# Patient Record
Sex: Male | Born: 1945
Health system: Southern US, Community
[De-identification: ages and names within clinical notes are randomized; demographics above are authoritative.]

## PROBLEM LIST (undated history)

## (undated) DIAGNOSIS — F419 Anxiety disorder, unspecified: Secondary | ICD-10-CM

## (undated) DIAGNOSIS — F3181 Bipolar II disorder: Secondary | ICD-10-CM

## (undated) DIAGNOSIS — F32A Depression, unspecified: Secondary | ICD-10-CM

## (undated) DIAGNOSIS — C61 Malignant neoplasm of prostate: Secondary | ICD-10-CM

## (undated) HISTORY — PX: REPLACEMENT TOTAL KNEE: SUR1224

## (undated) HISTORY — PX: PROSTATE BIOPSY: SHX241

---

## 2015-03-22 DIAGNOSIS — R972 Elevated prostate specific antigen [PSA]: Secondary | ICD-10-CM | POA: Diagnosis not present

## 2015-03-22 DIAGNOSIS — N401 Enlarged prostate with lower urinary tract symptoms: Secondary | ICD-10-CM | POA: Diagnosis not present

## 2015-05-10 DIAGNOSIS — M150 Primary generalized (osteo)arthritis: Secondary | ICD-10-CM | POA: Diagnosis not present

## 2015-05-10 DIAGNOSIS — M797 Fibromyalgia: Secondary | ICD-10-CM | POA: Diagnosis not present

## 2015-05-10 DIAGNOSIS — G4700 Insomnia, unspecified: Secondary | ICD-10-CM | POA: Diagnosis not present

## 2015-05-18 DIAGNOSIS — R972 Elevated prostate specific antigen [PSA]: Secondary | ICD-10-CM | POA: Diagnosis not present

## 2015-05-18 DIAGNOSIS — N401 Enlarged prostate with lower urinary tract symptoms: Secondary | ICD-10-CM | POA: Diagnosis not present

## 2015-05-24 DIAGNOSIS — R972 Elevated prostate specific antigen [PSA]: Secondary | ICD-10-CM | POA: Diagnosis not present

## 2015-05-24 DIAGNOSIS — N138 Other obstructive and reflux uropathy: Secondary | ICD-10-CM | POA: Diagnosis not present

## 2015-07-12 DIAGNOSIS — N210 Calculus in bladder: Secondary | ICD-10-CM | POA: Diagnosis not present

## 2015-07-12 DIAGNOSIS — N401 Enlarged prostate with lower urinary tract symptoms: Secondary | ICD-10-CM | POA: Diagnosis not present

## 2015-07-12 DIAGNOSIS — N323 Diverticulum of bladder: Secondary | ICD-10-CM | POA: Diagnosis not present

## 2015-07-12 DIAGNOSIS — Z0000 Encounter for general adult medical examination without abnormal findings: Secondary | ICD-10-CM | POA: Diagnosis not present

## 2015-07-12 DIAGNOSIS — R972 Elevated prostate specific antigen [PSA]: Secondary | ICD-10-CM | POA: Diagnosis not present

## 2015-07-12 DIAGNOSIS — N4883 Acquired buried penis: Secondary | ICD-10-CM | POA: Diagnosis not present

## 2015-08-31 DIAGNOSIS — R972 Elevated prostate specific antigen [PSA]: Secondary | ICD-10-CM | POA: Diagnosis not present

## 2015-09-01 DIAGNOSIS — C61 Malignant neoplasm of prostate: Secondary | ICD-10-CM | POA: Diagnosis not present

## 2015-09-11 DIAGNOSIS — I10 Essential (primary) hypertension: Secondary | ICD-10-CM | POA: Diagnosis not present

## 2015-09-11 DIAGNOSIS — Z87448 Personal history of other diseases of urinary system: Secondary | ICD-10-CM | POA: Diagnosis not present

## 2015-09-11 DIAGNOSIS — C61 Malignant neoplasm of prostate: Secondary | ICD-10-CM | POA: Diagnosis not present

## 2015-09-11 DIAGNOSIS — F411 Generalized anxiety disorder: Secondary | ICD-10-CM | POA: Diagnosis not present

## 2015-09-11 DIAGNOSIS — Z79899 Other long term (current) drug therapy: Secondary | ICD-10-CM | POA: Diagnosis not present

## 2015-09-11 DIAGNOSIS — R413 Other amnesia: Secondary | ICD-10-CM | POA: Diagnosis not present

## 2015-09-11 DIAGNOSIS — F329 Major depressive disorder, single episode, unspecified: Secondary | ICD-10-CM | POA: Diagnosis not present

## 2015-10-02 DIAGNOSIS — C61 Malignant neoplasm of prostate: Secondary | ICD-10-CM | POA: Diagnosis not present

## 2015-10-12 DIAGNOSIS — R5383 Other fatigue: Secondary | ICD-10-CM | POA: Diagnosis not present

## 2015-10-12 DIAGNOSIS — M179 Osteoarthritis of knee, unspecified: Secondary | ICD-10-CM | POA: Diagnosis not present

## 2015-10-12 DIAGNOSIS — R7303 Prediabetes: Secondary | ICD-10-CM | POA: Diagnosis not present

## 2015-10-12 DIAGNOSIS — E538 Deficiency of other specified B group vitamins: Secondary | ICD-10-CM | POA: Diagnosis not present

## 2015-10-12 DIAGNOSIS — R251 Tremor, unspecified: Secondary | ICD-10-CM | POA: Diagnosis not present

## 2015-10-30 DIAGNOSIS — F410 Panic disorder [episodic paroxysmal anxiety] without agoraphobia: Secondary | ICD-10-CM | POA: Diagnosis not present

## 2015-10-30 DIAGNOSIS — F331 Major depressive disorder, recurrent, moderate: Secondary | ICD-10-CM | POA: Diagnosis not present

## 2015-11-29 DIAGNOSIS — F410 Panic disorder [episodic paroxysmal anxiety] without agoraphobia: Secondary | ICD-10-CM | POA: Diagnosis not present

## 2015-11-29 DIAGNOSIS — F331 Major depressive disorder, recurrent, moderate: Secondary | ICD-10-CM | POA: Diagnosis not present

## 2015-11-30 DIAGNOSIS — Z23 Encounter for immunization: Secondary | ICD-10-CM | POA: Diagnosis not present

## 2015-11-30 DIAGNOSIS — D1800 Hemangioma unspecified site: Secondary | ICD-10-CM | POA: Diagnosis not present

## 2015-11-30 DIAGNOSIS — L821 Other seborrheic keratosis: Secondary | ICD-10-CM | POA: Diagnosis not present

## 2015-11-30 DIAGNOSIS — L814 Other melanin hyperpigmentation: Secondary | ICD-10-CM | POA: Diagnosis not present

## 2015-11-30 DIAGNOSIS — L918 Other hypertrophic disorders of the skin: Secondary | ICD-10-CM | POA: Diagnosis not present

## 2016-01-16 DIAGNOSIS — F418 Other specified anxiety disorders: Secondary | ICD-10-CM | POA: Diagnosis not present

## 2016-02-16 DIAGNOSIS — I10 Essential (primary) hypertension: Secondary | ICD-10-CM | POA: Diagnosis not present

## 2016-02-16 DIAGNOSIS — F329 Major depressive disorder, single episode, unspecified: Secondary | ICD-10-CM | POA: Diagnosis not present

## 2016-07-24 DIAGNOSIS — R351 Nocturia: Secondary | ICD-10-CM | POA: Diagnosis not present

## 2016-07-24 DIAGNOSIS — N401 Enlarged prostate with lower urinary tract symptoms: Secondary | ICD-10-CM | POA: Diagnosis not present

## 2016-07-24 DIAGNOSIS — C61 Malignant neoplasm of prostate: Secondary | ICD-10-CM | POA: Diagnosis not present

## 2016-08-16 DIAGNOSIS — F329 Major depressive disorder, single episode, unspecified: Secondary | ICD-10-CM | POA: Diagnosis not present

## 2016-08-16 DIAGNOSIS — Z79899 Other long term (current) drug therapy: Secondary | ICD-10-CM | POA: Diagnosis not present

## 2016-08-16 DIAGNOSIS — F411 Generalized anxiety disorder: Secondary | ICD-10-CM | POA: Diagnosis not present

## 2016-08-16 DIAGNOSIS — I10 Essential (primary) hypertension: Secondary | ICD-10-CM | POA: Diagnosis not present

## 2016-09-09 ENCOUNTER — Emergency Department (HOSPITAL_COMMUNITY): Payer: Medicare Other

## 2016-09-09 ENCOUNTER — Encounter (HOSPITAL_COMMUNITY): Payer: Self-pay | Admitting: *Deleted

## 2016-09-09 ENCOUNTER — Emergency Department (HOSPITAL_COMMUNITY)
Admission: EM | Admit: 2016-09-09 | Discharge: 2016-09-09 | Disposition: A | Discharge status: Home / Self Care | Payer: Medicare Other | Attending: Emergency Medicine | Admitting: Emergency Medicine

## 2016-09-09 DIAGNOSIS — R251 Tremor, unspecified: Secondary | ICD-10-CM

## 2016-09-09 DIAGNOSIS — Z96653 Presence of artificial knee joint, bilateral: Secondary | ICD-10-CM | POA: Insufficient documentation

## 2016-09-09 DIAGNOSIS — N183 Chronic kidney disease, stage 3 unspecified: Secondary | ICD-10-CM

## 2016-09-09 DIAGNOSIS — Z79899 Other long term (current) drug therapy: Secondary | ICD-10-CM | POA: Insufficient documentation

## 2016-09-09 DIAGNOSIS — R404 Transient alteration of awareness: Secondary | ICD-10-CM | POA: Diagnosis not present

## 2016-09-09 DIAGNOSIS — G4700 Insomnia, unspecified: Secondary | ICD-10-CM | POA: Diagnosis not present

## 2016-09-09 DIAGNOSIS — R55 Syncope and collapse: Secondary | ICD-10-CM

## 2016-09-09 DIAGNOSIS — Z8546 Personal history of malignant neoplasm of prostate: Secondary | ICD-10-CM | POA: Diagnosis not present

## 2016-09-09 DIAGNOSIS — S0990XA Unspecified injury of head, initial encounter: Secondary | ICD-10-CM | POA: Diagnosis not present

## 2016-09-09 HISTORY — DX: Bipolar II disorder: F31.81

## 2016-09-09 HISTORY — DX: Anxiety disorder, unspecified: F41.9

## 2016-09-09 LAB — CBC WITH DIFFERENTIAL/PLATELET
Basophils Absolute: 0 10*3/uL (ref 0.0–0.1)
Basophils Relative: 0 %
Eosinophils Absolute: 0.1 10*3/uL (ref 0.0–0.7)
Eosinophils Relative: 1 %
HCT: 39.7 % (ref 39.0–52.0)
Hemoglobin: 13.9 g/dL (ref 13.0–17.0)
Lymphocytes Relative: 14 %
Lymphs Abs: 1.9 10*3/uL (ref 0.7–4.0)
MCH: 29.2 pg (ref 26.0–34.0)
MCHC: 35 g/dL (ref 30.0–36.0)
MCV: 83.4 fL (ref 78.0–100.0)
Monocytes Absolute: 0.8 10*3/uL (ref 0.1–1.0)
Monocytes Relative: 5 %
Neutro Abs: 11.2 10*3/uL — ABNORMAL HIGH (ref 1.7–7.7)
Neutrophils Relative %: 80 %
Platelets: 255 10*3/uL (ref 150–400)
RBC: 4.76 MIL/uL (ref 4.22–5.81)
RDW: 13.5 % (ref 11.5–15.5)
WBC: 14 10*3/uL — ABNORMAL HIGH (ref 4.0–10.5)

## 2016-09-09 LAB — COMPREHENSIVE METABOLIC PANEL
ALBUMIN: 4.2 g/dL (ref 3.5–5.0)
ALK PHOS: 58 U/L (ref 38–126)
ALT: 31 U/L (ref 17–63)
AST: 44 U/L — AB (ref 15–41)
Anion gap: 9 (ref 5–15)
BUN: 25 mg/dL — ABNORMAL HIGH (ref 6–20)
CALCIUM: 9.2 mg/dL (ref 8.9–10.3)
CHLORIDE: 107 mmol/L (ref 101–111)
CO2: 22 mmol/L (ref 22–32)
CREATININE: 1.39 mg/dL — AB (ref 0.61–1.24)
GFR calc Af Amer: 57 mL/min — ABNORMAL LOW (ref >60)
GFR calc non Af Amer: 49 mL/min — ABNORMAL LOW (ref >60)
GLUCOSE: 122 mg/dL — AB (ref 65–99)
Potassium: 3.8 mmol/L (ref 3.5–5.1)
SODIUM: 138 mmol/L (ref 135–145)
Total Bilirubin: 0.6 mg/dL (ref 0.3–1.2)
Total Protein: 7.1 g/dL (ref 6.5–8.1)

## 2016-09-09 LAB — URINALYSIS, ROUTINE W REFLEX MICROSCOPIC
Bilirubin Urine: NEGATIVE
Glucose, UA: NEGATIVE mg/dL
Hgb urine dipstick: NEGATIVE
Ketones, ur: NEGATIVE mg/dL
Leukocytes, UA: NEGATIVE
Nitrite: NEGATIVE
Protein, ur: NEGATIVE mg/dL
Specific Gravity, Urine: 1.017 (ref 1.005–1.030)
pH: 5 (ref 5.0–8.0)

## 2016-09-09 LAB — TROPONIN I: Troponin I: <0.03 ng/mL

## 2016-09-09 LAB — CBG MONITORING, ED: Glucose-Capillary: 132 mg/dL — ABNORMAL HIGH (ref 65–99)

## 2016-09-09 MED ORDER — SODIUM CHLORIDE 0.9 % IV BOLUS (SEPSIS)
1000.0000 mL | Freq: Once | INTRAVENOUS | Status: AC
  Administered 2016-09-09: 1000 mL via INTRAVENOUS

## 2016-09-09 MED ORDER — SODIUM CHLORIDE 0.9 % IV SOLN: INTRAVENOUS | Status: DC

## 2016-09-09 MED ORDER — ZOLPIDEM TARTRATE 5 MG PO TABS: 5.0000 mg | ORAL_TABLET | Freq: Every evening | ORAL | 0 refills | Status: DC | PRN

## 2016-09-09 NOTE — ED Triage Notes (Signed)
Patient is alert and oriented x4.  He is being seen for a near syncopal episode.  Patient states that he bent over to pick something up and became dizzy and fell.  Patient denies any LOC or pain.  Patient states that he is unable to stand.

## 2016-09-09 NOTE — ED Provider Notes (Signed)
[facility] DEPT Provider Note   CSN: [id number] Arrival date & time: 09/09/16  1549     History   Chief Complaint Chief Complaint  Patient presents with  . Near Syncope    HPI Calvin Santiago is a 71 y.o. male.  Pt presents to the ED today with near syncope.  Pt said that he bent over to pick something up, became dizzy, and fell.  The pt said that he hit head into a pet bed, so it was soft.  The pt was unable to stand up after the fall.  Pt said his pcp at [facility] left the practice and he does not have his new pt appt with his new doctor until August.  He also said that he has had a tremor to his right hand for a year.  He has had trouble walking.  He was going to get evaluated for Parkinson's disease, but then his doctor left, so he did not get the referral yet.   He has not been able to sleep for the last 3 days.  He has a hx of bipolar disease, but is not currently on any meds.  Pt also has prostate cancer and is not currently getting any treatment.  He had surgery on it once, and said his urologist is watching it.      Past Medical History:  Diagnosis Date  . Anxiety   . Bipolar 2 disorder ([place])   prostate cancer  There are no active problems to display for this patient.   Past Surgical History:  Procedure Laterality Date  . REPLACEMENT TOTAL KNEE     bilateral   Prostate surgery    Home Medications    Prior to Admission medications   Medication Sig Start Date End Date Taking? Authorizing Provider  acetaminophen (TYLENOL) 500 MG tablet Take 1,000 mg by mouth every 6 (six) hours as needed for moderate pain.   Yes [provider]  ALPRAZolam Duanne Moron) 1 MG tablet Take 1 mg by mouth 3 (three) times daily.   Yes [provider]  amLODipine (NORVASC) 10 MG tablet Take 5 mg by mouth daily.   Yes [provider]  ibuprofen (ADVIL,MOTRIN) 200 MG tablet Take 800 mg by mouth every 6 (six) hours as needed for moderate pain.   Yes [provider]  sertraline (ZOLOFT) 100 MG tablet Take 100 mg by mouth daily.   Yes [provider]  zolpidem (AMBIEN) 5 MG tablet Take 1 tablet (5 mg total) by mouth at bedtime as needed for sleep. 09/09/16   Isla Pence, MD    Family History No family history on file.  Social History Social History  Substance Use Topics  . Smoking status: Not on file  . Smokeless tobacco: Not on file  . Alcohol use Not on file     Allergies   Sulfa antibiotics   Review of Systems Review of Systems  Neurological: Positive for tremors and syncope.  Psychiatric/Behavioral: Positive for sleep disturbance.  All other systems reviewed and are negative.    Physical Exam Updated Vital Signs BP 107/81 (BP Location: Left Arm)   Pulse 96   Temp 98 F (36.7 C) (Oral)   Resp 16   Ht 6\' 2"  (1.88 m)   Wt 124.7 kg (275 lb)   SpO2 96%   BMI 35.31 kg/m   Physical Exam  Constitutional: He is oriented to person, place, and time. He appears well-developed and well-nourished.  HENT:  Head: Normocephalic and atraumatic.  Right Ear: External ear normal.  Left Ear: External ear normal.  Nose: Nose normal.  Mouth/Throat: Oropharynx is clear and moist.  Eyes: Conjunctivae and EOM are normal. Pupils are equal, round, and reactive to light.  Neck: Normal range of motion. Neck supple.  Cardiovascular: Normal rate, regular rhythm, normal heart sounds and intact distal pulses.   Pulmonary/Chest: Effort normal and breath sounds normal.  Abdominal: Soft. Bowel sounds are normal.  Musculoskeletal: Normal range of motion.  Neurological: He is alert and oriented to person, place, and time.  Skin: Skin is warm.  Psychiatric: He has a normal mood and affect. His behavior is normal. Judgment and thought content normal.  Nursing note and vitals reviewed.    ED Treatments / Results  Labs (all labs ordered are listed, but only abnormal results are displayed) Labs Reviewed  CBC WITH DIFFERENTIAL/PLATELET -  Abnormal; Notable for the following:       Result Value   WBC 14.0 (*)    Neutro Abs 11.2 (*)    All other components within normal limits  COMPREHENSIVE METABOLIC PANEL - Abnormal; Notable for the following:    Glucose, Bld 122 (*)    BUN 25 (*)    Creatinine, Ser 1.39 (*)    AST 44 (*)    GFR calc non Af Amer 49 (*)    GFR calc Af Amer 57 (*)    All other components within normal limits  CBG MONITORING, ED - Abnormal; Notable for the following:    Glucose-Capillary 132 (*)    All other components within normal limits  TROPONIN I  URINALYSIS, ROUTINE W REFLEX MICROSCOPIC    EKG  EKG Interpretation  Date/Time:  Monday September 09 2016 16:14:47 EDT Ventricular Rate:  94 PR Interval:    QRS Duration: 89 QT Interval:  343 QTC Calculation: 429 R Axis:   -30 Text Interpretation:  Sinus rhythm Left axis deviation Low voltage, precordial leads Confirmed by Isla Pence [id number]) on 09/09/2016 5:50:02 PM       Radiology Dg Chest 2 View  Result Date: 09/09/2016 CLINICAL DATA:  Syncopal episode today. EXAM: CHEST  2 VIEW COMPARISON:  None. FINDINGS: The lungs are clear without focal pneumonia, edema, pneumothorax or pleural effusion. The cardiopericardial silhouette is within normal limits for size. The visualized bony structures of the thorax are intact. Telemetry leads overlie the chest. IMPRESSION: No active cardiopulmonary disease. Electronically Signed   By: Misty Stanley M.D.   On: 09/09/2016 17:57   Ct Head Wo Contrast  Result Date: 09/09/2016 CLINICAL DATA:  Patient got dizzy and fell face down on floor. EXAM: CT HEAD WITHOUT CONTRAST TECHNIQUE: Contiguous axial images were obtained from the base of the skull through the vertex without intravenous contrast. COMPARISON:  None. FINDINGS: Brain: There is no evidence for acute hemorrhage, hydrocephalus, mass lesion, or abnormal extra-axial fluid collection. No definite CT evidence for acute infarction. There is no evidence for acute  hemorrhage, hydrocephalus, mass lesion, or abnormal extra-axial fluid collection. No definite CT evidence for acute infarction. Diffuse loss of parenchymal volume is consistent with atrophy. Patchy low attenuation in the deep hemispheric and periventricular white matter is nonspecific, but likely reflects chronic microvascular ischemic demyelination. Vascular: No hyperdense vessel or unexpected calcification. Skull: No evidence for fracture. No worrisome lytic or sclerotic lesion. Sinuses/Orbits: The visualized paranasal sinuses and mastoid air cells are clear. Visualized portions of the globes and intraorbital fat are unremarkable. Other: None. IMPRESSION: 1. No acute intracranial abnormality. 2. Atrophy with  chronic small vessel white matter ischemic disease. Electronically Signed   By: Misty Stanley M.D.   On: 09/09/2016 18:05    Procedures Procedures (including critical care time)  Medications Ordered in ED Medications  sodium chloride 0.9 % bolus 1,000 mL (1,000 mLs Intravenous New Bag/Given 09/09/16 1708)    And  0.9 %  sodium chloride infusion (not administered)     Initial Impression / Assessment and Plan / ED Course  I have reviewed the triage vital signs and the nursing notes.  Pertinent labs & imaging results that were available during my care of the patient were reviewed by me and considered in my medical decision making (see chart for details).     Pt is feeling better.  He said he's been told he has CRI.  He is instructed to f/u with his pcp and return if worse.  Final Clinical Impressions(s) / ED Diagnoses   Final diagnoses:  Near syncope  CRI (chronic renal insufficiency), stage 3 (moderate)  Tremor  Insomnia, unspecified type    New Prescriptions New Prescriptions   ZOLPIDEM (AMBIEN) 5 MG TABLET    Take 1 tablet (5 mg total) by mouth at bedtime as needed for sleep.     Isla Pence, MD 09/09/16 [phone]

## 2016-09-09 NOTE — ED Notes (Signed)
Patient is alert and oriented x3.  He was given DC instructions and follow up visit instructions.  Patient gave verbal understanding.  He was DC ambulatory under his own power to home.  V/S stable.  He was not showing any signs of distress on DC 

## 2016-09-13 ENCOUNTER — Emergency Department (HOSPITAL_COMMUNITY): Payer: Medicare Other

## 2016-09-13 ENCOUNTER — Encounter (HOSPITAL_COMMUNITY): Payer: Self-pay | Admitting: Emergency Medicine

## 2016-09-13 ENCOUNTER — Emergency Department (HOSPITAL_COMMUNITY)
Admission: EM | Admit: 2016-09-13 | Discharge: 2016-09-16 | Disposition: A | Discharge status: Other Acute Inpatient Hospital | Payer: Medicare Other | Attending: Emergency Medicine | Admitting: Emergency Medicine

## 2016-09-13 DIAGNOSIS — R45851 Suicidal ideations: Secondary | ICD-10-CM | POA: Insufficient documentation

## 2016-09-13 DIAGNOSIS — Z79899 Other long term (current) drug therapy: Secondary | ICD-10-CM | POA: Insufficient documentation

## 2016-09-13 DIAGNOSIS — F410 Panic disorder [episodic paroxysmal anxiety] without agoraphobia: Secondary | ICD-10-CM | POA: Diagnosis not present

## 2016-09-13 DIAGNOSIS — F332 Major depressive disorder, recurrent severe without psychotic features: Secondary | ICD-10-CM | POA: Diagnosis not present

## 2016-09-13 DIAGNOSIS — F309 Manic episode, unspecified: Secondary | ICD-10-CM | POA: Diagnosis not present

## 2016-09-13 DIAGNOSIS — G4700 Insomnia, unspecified: Secondary | ICD-10-CM | POA: Diagnosis not present

## 2016-09-13 DIAGNOSIS — F919 Conduct disorder, unspecified: Secondary | ICD-10-CM | POA: Diagnosis present

## 2016-09-13 DIAGNOSIS — R443 Hallucinations, unspecified: Secondary | ICD-10-CM | POA: Insufficient documentation

## 2016-09-13 DIAGNOSIS — R4182 Altered mental status, unspecified: Secondary | ICD-10-CM | POA: Diagnosis not present

## 2016-09-13 DIAGNOSIS — F312 Bipolar disorder, current episode manic severe with psychotic features: Secondary | ICD-10-CM | POA: Diagnosis not present

## 2016-09-13 LAB — COMPREHENSIVE METABOLIC PANEL
ALK PHOS: 56 U/L (ref 38–126)
ALT: 43 U/L (ref 17–63)
ANION GAP: 11 (ref 5–15)
AST: 49 U/L — ABNORMAL HIGH (ref 15–41)
Albumin: 4.5 g/dL (ref 3.5–5.0)
BUN: 17 mg/dL (ref 6–20)
CALCIUM: 9.5 mg/dL (ref 8.9–10.3)
CO2: 23 mmol/L (ref 22–32)
Chloride: 105 mmol/L (ref 101–111)
Creatinine, Ser: 1.02 mg/dL (ref 0.61–1.24)
GFR calc non Af Amer: >60 mL/min (ref >60)
Glucose, Bld: 113 mg/dL — ABNORMAL HIGH (ref 65–99)
Potassium: 5.1 mmol/L (ref 3.5–5.1)
SODIUM: 139 mmol/L (ref 135–145)
TOTAL PROTEIN: 7.6 g/dL (ref 6.5–8.1)
Total Bilirubin: 1.7 mg/dL — ABNORMAL HIGH (ref 0.3–1.2)

## 2016-09-13 LAB — URINALYSIS, ROUTINE W REFLEX MICROSCOPIC
Bacteria, UA: NONE SEEN
Bilirubin Urine: NEGATIVE
GLUCOSE, UA: NEGATIVE mg/dL
Ketones, ur: NEGATIVE mg/dL
Leukocytes, UA: NEGATIVE
NITRITE: NEGATIVE
PH: 5 (ref 5.0–8.0)
Protein, ur: NEGATIVE mg/dL
SPECIFIC GRAVITY, URINE: 1.018 (ref 1.005–1.030)

## 2016-09-13 LAB — RAPID URINE DRUG SCREEN, HOSP PERFORMED
Amphetamines: NOT DETECTED
Barbiturates: NOT DETECTED
Benzodiazepines: POSITIVE — AB
Cocaine: NOT DETECTED
OPIATES: NOT DETECTED
TETRAHYDROCANNABINOL: NOT DETECTED

## 2016-09-13 LAB — CBC
HCT: 42.7 % (ref 39.0–52.0)
HEMOGLOBIN: 14.8 g/dL (ref 13.0–17.0)
MCH: 28.8 pg (ref 26.0–34.0)
MCHC: 34.7 g/dL (ref 30.0–36.0)
MCV: 83.2 fL (ref 78.0–100.0)
Platelets: 264 10*3/uL (ref 150–400)
RBC: 5.13 MIL/uL (ref 4.22–5.81)
RDW: 13.6 % (ref 11.5–15.5)
WBC: 9.1 10*3/uL (ref 4.0–10.5)

## 2016-09-13 LAB — SALICYLATE LEVEL

## 2016-09-13 LAB — ACETAMINOPHEN LEVEL: Acetaminophen (Tylenol), Serum: <10 ug/mL — ABNORMAL LOW (ref 10–30)

## 2016-09-13 LAB — ETHANOL

## 2016-09-13 LAB — I-STAT CG4 LACTIC ACID, ED
LACTIC ACID, VENOUS: 1.13 mmol/L (ref 0.5–1.9)
Lactic Acid, Venous: 0.91 mmol/L (ref 0.5–1.9)

## 2016-09-13 MED ORDER — LORAZEPAM 1 MG PO TABS
2.0000 mg | ORAL_TABLET | Freq: Once | ORAL | Status: AC
  Administered 2016-09-13: 2 mg via ORAL
  Filled 2016-09-13: qty 2

## 2016-09-13 MED ORDER — ALPRAZOLAM 1 MG PO TABS
1.0000 mg | ORAL_TABLET | ORAL | Status: DC
  Filled 2016-09-13: qty 1

## 2016-09-13 MED ORDER — ALUM & MAG HYDROXIDE-SIMETH 200-200-20 MG/5ML PO SUSP: 30.0000 mL | Freq: Four times a day (QID) | ORAL | Status: DC | PRN

## 2016-09-13 MED ORDER — AMLODIPINE BESYLATE 5 MG PO TABS
5.0000 mg | ORAL_TABLET | Freq: Every day | ORAL | Status: DC
  Administered 2016-09-14 – 2016-09-16 (×3): 5 mg via ORAL
  Filled 2016-09-13 (×3): qty 1

## 2016-09-13 MED ORDER — LORAZEPAM 2 MG/ML IJ SOLN: 1.0000 mg | Freq: Once | INTRAMUSCULAR | Status: DC

## 2016-09-13 MED ORDER — ONDANSETRON HCL 4 MG PO TABS: 4.0000 mg | ORAL_TABLET | Freq: Three times a day (TID) | ORAL | Status: DC | PRN

## 2016-09-13 MED ORDER — ACETAMINOPHEN 500 MG PO TABS
1000.0000 mg | ORAL_TABLET | Freq: Four times a day (QID) | ORAL | Status: DC | PRN
  Administered 2016-09-14: 1000 mg via ORAL
  Filled 2016-09-13: qty 2

## 2016-09-13 MED ORDER — ZOLPIDEM TARTRATE 5 MG PO TABS: 5.0000 mg | ORAL_TABLET | Freq: Every evening | ORAL | Status: DC | PRN

## 2016-09-13 MED ORDER — IBUPROFEN 200 MG PO TABS: 600.0000 mg | ORAL_TABLET | Freq: Four times a day (QID) | ORAL | Status: DC | PRN

## 2016-09-13 NOTE — BH Assessment (Addendum)
Assessment Note  Calvin Santiago is an 70 y.o. male that presents this date with thoughts of self harm but is very tangential and renders limited history. Patient is difficult to redirect but reports he was "thinking bad thoughts about not wanting to be here" earlier this date. This Probation officer cannot redirect patient to elaborate on content with patient rendering history from "twenty years ago" when he was "going to be done with it all." Information for purposes of assessment were obtained from admission notes and history review. Per that note, "Patient has a history of OCD and bipolar disorder complaining of a panic attack this date, patient is observed to be shaking, unable to speak beyond "yes" and "no." As triage progressed, patient became moderately verbal. Patient reports SI without plan, but states that sometime recently he pointed a gun at himself and considered killing himself, decided not to because people depended on him, patient unsure of when this occurred but does have access to guns. Reports hallucinations of doctors that aren't there. No HI, no auditory hallucinations. Patient is oriented to place only. Patient is speaking very rapidly and disorganized.Patient's mother is dying of cancer, wife states patient hasn't been normal since he visited his dying mother 2 weeks ago. Patient has had multiple episodes of "panic attacks" since. Wife has several papers on which patient has been writing and scribbling excessively, many scribbles and disorganized words covering entire paper, some words written repeatedly". Per notes, patient is receiving services from [place] MD. Case was staffed with [facility] NP who recommended patient be re-evaluated in the a.m.    Diagnosis: Bipolar 2 (per note review)  Past Medical History:  Past Medical History:  Diagnosis Date  . Anxiety   . Bipolar 2 disorder [facility])     Past Surgical History:  Procedure Laterality Date  . REPLACEMENT TOTAL KNEE     bilateral     Family History: History reviewed. No pertinent family history.  Social History:  has no tobacco, alcohol, and drug history on file.  Additional Social History:  Alcohol / Drug Use Pain Medications: See MAR Prescriptions: See MAR Over the Counter: See MAR History of alcohol / drug use?: No history of alcohol / drug abuse Longest period of sobriety (when/how long):  (NA) Negative Consequences of Use:  (NA) Withdrawal Symptoms:  (NA)  CIWA: CIWA-Ar BP: (!) 162/112 (movement) Pulse Rate: (!) 105 COWS:    Allergies:  Allergies  Allergen Reactions  . Sulfa Antibiotics Other (See Comments)    As a child, he ran into walls    Home Medications:  (Not in a hospital admission)  OB/GYN Status:  No LMP for male patient.  General Assessment Data Location of Assessment: [facility] ED TTS Assessment: In system Is this a Tele or Face-to-Face Assessment?: Face-to-Face Is this an Initial Assessment or a Re-assessment for this encounter?: Initial Assessment Marital status: Married [place] name: NA Is patient pregnant?: No Pregnancy Status: No Living Arrangements: Spouse/significant other Can pt return to current living arrangement?: Yes Admission Status: Voluntary Is patient capable of signing voluntary admission?: Yes Referral Source: Self/Family/Friend Insurance type: Medicare  Medical Screening Exam ([place]) Medical Exam completed: Yes  Crisis Care Plan Living Arrangements: Spouse/significant other Legal Guardian:  (NA) Name of Psychiatrist: Akintayo MD Name of Therapist: None  Education Status Is patient currently in school?: No Current Grade:  (NA) Highest grade of school patient has completed:  (12th grade) Name of school:  (NA) Contact person:  (NA)  Risk to self with  the past 6 months Suicidal Ideation: Yes-Currently Present Has patient been a risk to self within the past 6 months prior to admission? : No Suicidal Intent: No Has patient had any suicidal  intent within the past 6 months prior to admission? : No Is patient at risk for suicide?: Yes Suicidal Plan?: No Has patient had any suicidal plan within the past 6 months prior to admission? : No Access to Means: No What has been your use of drugs/alcohol within the last 12 months?: Denies Previous Attempts/Gestures: Yes How many times?: 1 (20 years ago pt is vague) Other Self Harm Risks: Unknown Triggers for Past Attempts: Unknown Intentional Self Injurious Behavior: None Family Suicide History: No Recent stressful life event(s):  (UTA) Persecutory voices/beliefs?: No Depression: No Depression Symptoms:  (pt is vague) Substance abuse history and/or treatment for substance abuse?: No Suicide prevention information given to non-admitted patients: Not applicable  Risk to Others within the past 6 months Homicidal Ideation: No Does patient have any lifetime risk of violence toward others beyond the six months prior to admission? : No Thoughts of Harm to Others: No Current Homicidal Intent: No Current Homicidal Plan: No Access to Homicidal Means: No Identified Victim: NA History of harm to others?: No Assessment of Violence: None Noted Violent Behavior Description: NA Does patient have access to weapons?: No Criminal Charges Pending?: No Does patient have a court date: No Is patient on probation?: No  Psychosis Hallucinations: None noted Delusions: None noted  Mental Status Report Appearance/Hygiene: In scrubs Eye Contact: Fair Motor Activity: Freedom of movement Speech: Rapid, Pressured Level of Consciousness: Restless Mood: Anxious Affect: Anxious Anxiety Level: Moderate Thought Processes: Tangential Judgement: Unimpaired Orientation: Place Obsessive Compulsive Thoughts/Behaviors: None  Cognitive Functioning Concentration: Decreased Memory: Recent Impaired IQ: Average Insight: Poor Impulse Control: Fair Appetite:  (UTA) Weight Loss:  (UTA) Weight Gain:   (UTA) Sleep:  (UTA) Total Hours of Sleep:  (UTA) Vegetative Symptoms: None  ADLScreening [facility] Assessment Services) Patient's cognitive ability adequate to safely complete daily activities?: Yes Patient able to express need for assistance with ADLs?: Yes Independently performs ADLs?: Yes (appropriate for developmental age)  Prior Inpatient Therapy Prior Inpatient Therapy: Yes Prior Therapy Dates:  (Pt stated 20 years ago) Prior Therapy Facilty/Provider(s):  (UTA) Reason for Treatment: S/I  Prior Outpatient Therapy Prior Outpatient Therapy: Yes Prior Therapy Dates: Ongoing Prior Therapy Facilty/Provider(s): Akintayo MD Reason for Treatment: MH issues Does patient have an ACCT team?: No Does patient have Intensive In-House Services?  : No Does patient have [facility] services? : No Does patient have P4CC services?: No  ADL Screening (condition at time of admission) Patient's cognitive ability adequate to safely complete daily activities?: Yes Is the patient deaf or have difficulty hearing?: No Does the patient have difficulty seeing, even when wearing glasses/contacts?: No Does the patient have difficulty concentrating, remembering, or making decisions?: No Patient able to express need for assistance with ADLs?: Yes Does the patient have difficulty dressing or bathing?: No Independently performs ADLs?: Yes (appropriate for developmental age) Does the patient have difficulty walking or climbing stairs?: No Weakness of Legs: Both Weakness of Arms/Hands: None  Home Assistive Devices/Equipment Home Assistive Devices/Equipment: None  Therapy Consults (therapy consults require a physician order) PT Evaluation Needed: No OT Evalulation Needed: No SLP Evaluation Needed: No Abuse/Neglect Assessment (Assessment to be complete while patient is alone) Physical Abuse: Denies Verbal Abuse: Denies Sexual Abuse: Denies Exploitation of patient/patient's resources: Denies Self-Neglect:  Denies Values / Beliefs Cultural Requests During Hospitalization:  None Spiritual Requests During Hospitalization: None Consults Spiritual Care Consult Needed: No Social Work Consult Needed: No Regulatory affairs officer (For Healthcare) Does Patient Have a Medical Advance Directive?: No Would patient like information on creating a medical advance directive?: No - Patient declined    Additional Information 1:1 In Past 12 Months?: No CIRT Risk: No Elopement Risk: No Does patient have medical clearance?: No     Disposition: Case was staffed with Lu Duffel NP who recommended patient be re-evaluated in the a.m.   Disposition Initial Assessment Completed for this Encounter: Yes Disposition of Patient: Other dispositions Other disposition(s): Other (Comment) (Re-evaluate in the a.m.)  On Site Evaluation by:   Reviewed with Physician:    Mamie Nick 09/13/2016 4:03 PM

## 2016-09-13 NOTE — ED Notes (Signed)
Stuck patient for IV access, was able to obtain some blood for specimens but not enough needed for all labs ordered.  IV wasn't success.  Made Dr Ellender Hose aware of not able to obtain IV access.  Waiting for Orders for route change of medication

## 2016-09-13 NOTE — ED Provider Notes (Signed)
[facility] DEPT Provider Note   CSN: [id number] Arrival date & time: 09/13/16  0957     History   Chief Complaint Chief Complaint  Patient presents with  . Suicidal    HPI Calvin Santiago is a 71 y.o. male.  HPI  71 yo M with history of bipolar disorder, anxiety here with suicidal ideation and erratic behavior. History is limited 2/2 pressured speech with tangential thought process. However, per wife, pt recently learned of his mother's illness/placement in hospice. Since learning this, he has had increasingly erratic behavior, apparent hallucinations, and confusion, as well as pressured speech. He has a h/o hypomania/bipolar 2 disorder. On my assessment, pt is discussing his upbringing, his cousins/uncles, but when prompted to ask why he was seen here, goes off on other tangents regarding his upbringing.   Level 5 caveat invoked as remainder of history, ROS, and physical exam limited due to patient's mania.   Past Medical History:  Diagnosis Date  . Anxiety   . Bipolar 2 disorder ([place])     There are no active problems to display for this patient.   Past Surgical History:  Procedure Laterality Date  . REPLACEMENT TOTAL KNEE     bilateral       Home Medications    Prior to Admission medications   Medication Sig Start Date End Date Taking? Authorizing Provider  acetaminophen (TYLENOL) 500 MG tablet Take 1,000 mg by mouth every 6 (six) hours as needed for moderate pain.   Yes [provider]  ALPRAZolam Duanne Moron) 1 MG tablet Take 1 mg by mouth See admin instructions. 1 mg twice a day, 1 mg additional prn for anxiety   Yes [provider]  amLODipine (NORVASC) 10 MG tablet Take 5 mg by mouth daily.   Yes [provider]  ibuprofen (ADVIL,MOTRIN) 200 MG tablet Take 800 mg by mouth every 6 (six) hours as needed for moderate pain.   Yes [provider]  sertraline (ZOLOFT) 100 MG tablet Take 100 mg by mouth daily.   Yes [provider]  zolpidem (AMBIEN) 5 MG tablet Take 1 tablet (5 mg total) by mouth at bedtime as needed for sleep. 09/09/16  Yes Isla Pence, MD    Family History History reviewed. No pertinent family history.  Social History Social History  Substance Use Topics  . Smoking status: Not on file  . Smokeless tobacco: Not on file  . Alcohol use Not on file     Allergies   Sulfa antibiotics   Review of Systems Review of Systems  Unable to perform ROS: Mental status change  Constitutional: Positive for fatigue.  Psychiatric/Behavioral: Positive for agitation, behavioral problems, confusion, decreased concentration, hallucinations and suicidal ideas.     Physical Exam Updated Vital Signs BP (!) 154/90 (BP Location: Right Arm)   Pulse 95   Temp 98 F (36.7 C) (Oral)   Resp 18   SpO2 98%   Physical Exam  Constitutional: He is oriented to person, place, and time. He appears well-developed and well-nourished. No distress.  HENT:  Head: Normocephalic and atraumatic.  Eyes: Conjunctivae are normal.  Neck: Neck supple.  Cardiovascular: Normal rate, regular rhythm and normal heart sounds.  Exam reveals no friction rub.   No murmur heard. Pulmonary/Chest: Effort normal and breath sounds normal. No respiratory distress. He has no wheezes. He has no rales.  Abdominal: He exhibits no distension.  Musculoskeletal: He exhibits no edema.  Neurological: He is alert and oriented to person, place, and  time. He exhibits normal muscle tone.  Skin: Skin is warm. Capillary refill takes less than 2 seconds.  Psychiatric: His mood appears anxious. His affect is labile. His speech is rapid and/or pressured. He is hyperactive. Cognition and memory are impaired. He does not express impulsivity. He expresses suicidal ideation.  Nursing note and vitals reviewed.    ED Treatments / Results  Labs (all labs ordered are listed, but only abnormal results are displayed) Labs Reviewed  COMPREHENSIVE  METABOLIC PANEL - Abnormal; Notable for the following:       Result Value   Glucose, Bld 113 (*)    AST 49 (*)    Total Bilirubin 1.7 (*)    All other components within normal limits  ACETAMINOPHEN LEVEL - Abnormal; Notable for the following:    Acetaminophen (Tylenol), Serum <10 (*)    All other components within normal limits  RAPID URINE DRUG SCREEN, HOSP PERFORMED - Abnormal; Notable for the following:    Benzodiazepines POSITIVE (*)    All other components within normal limits  URINALYSIS, ROUTINE W REFLEX MICROSCOPIC - Abnormal; Notable for the following:    Hgb urine dipstick SMALL (*)    Squamous Epithelial / LPF 0-5 (*)    All other components within normal limits  ETHANOL  SALICYLATE LEVEL  CBC  I-STAT CG4 LACTIC ACID, ED  I-STAT CG4 LACTIC ACID, ED    EKG  EKG Interpretation  Date/Time:  Friday September 13 2016 15:24:43 EDT Ventricular Rate:  77 PR Interval:  172 QRS Duration: 88 QT Interval:  402 QTC Calculation: 454 R Axis:   -5 Text Interpretation:  Normal sinus rhythm Inferior infarct , age undetermined Abnormal ECG No significant change since last tracing Confirmed by Duffy Bruce [phone]) on 09/13/2016 7:08:00 PM       Radiology Dg Chest 2 View  Result Date: 09/13/2016 CLINICAL DATA:  Altered mental status. EXAM: CHEST  2 VIEW COMPARISON:  09/09/2016 FINDINGS: The heart size and mediastinal contours are within normal limits. Both lungs are clear. The visualized skeletal structures are unremarkable. IMPRESSION: No active cardiopulmonary disease. Electronically Signed   By: Lorriane Shire M.D.   On: 09/13/2016 14:26   Ct Head Wo Contrast  Result Date: 09/13/2016 CLINICAL DATA:  Altered mental status EXAM: CT HEAD WITHOUT CONTRAST TECHNIQUE: Contiguous axial images were obtained from the base of the skull through the vertex without intravenous contrast. COMPARISON:  09/09/2016 FINDINGS: Brain: No evidence of acute infarction, hemorrhage, hydrocephalus,  extra-axial collection or mass lesion/mass effect. Stable mild for age cerebral volume loss. Vascular: No hyperdense vessel or unexpected calcification. Skull: Normal. Negative for fracture or focal lesion. Sinuses/Orbits: No acute finding. IMPRESSION: No acute or reversible finding.  No explanation for symptoms. Electronically Signed   By: Monte Fantasia M.D.   On: 09/13/2016 11:53    Procedures Procedures (including critical care time)  Medications Ordered in ED Medications  ondansetron (ZOFRAN) tablet 4 mg (not administered)  alum & mag hydroxide-simeth (MAALOX/MYLANTA) 200-200-20 MG/5ML suspension 30 mL (not administered)  ibuprofen (ADVIL,MOTRIN) tablet 600 mg (not administered)  amLODipine (NORVASC) tablet 5 mg (not administered)  ALPRAZolam (XANAX) tablet 1 mg (not administered)  acetaminophen (TYLENOL) tablet 1,000 mg (not administered)  LORazepam (ATIVAN) tablet 2 mg (2 mg Oral Given 09/13/16 1158)     Initial Impression / Assessment and Plan / ED Course  I have reviewed the triage vital signs and the nursing notes.  Pertinent labs & imaging results that were available during my care of  the patient were reviewed by me and considered in my medical decision making (see chart for details).     71 yo M with PMHx as above here with worsening mania, depression, and suicidal thoughts in setting of recent emotional stressors. Exam, labs are as above and are unremarkable. UA normal, CXR normal, CT head unremarkable. Lytes WNL. No apparent acute organic abnormality identified, and suspect primary psychiatric etiology. Will consult TTS for eval and dispo. Home meds ordered.  Final Clinical Impressions(s) / ED Diagnoses   Final diagnoses:  Suicidal thoughts  [facility])    New Prescriptions New Prescriptions   No medications on file     Duffy Bruce, MD 09/13/16 1910

## 2016-09-13 NOTE — ED Notes (Addendum)
Pt standing @ computer screen, sitter in room, pt began having seizure like activity, assisted to floor by 2 sitters.  Pt without incontinence, making snoring noise, Dr. Vanita Panda called to [facility].  Pt placed on LSB & back into bed.  Sz pads in place.

## 2016-09-13 NOTE — ED Triage Notes (Addendum)
Pt with hx of OCD and bipolar disorder c/o panic attack, pt shaking, unable to speak beyond "yes" and "no." As triage progressed, pt became moderately verbal. Pt reports SI without plan, but states that sometime recently he pointed a gun at himself and considered killing himself, decided not to because people depended on him, pt unsure of when this occurred but does have access to guns. Reports hallucinations of doctors that aren't there. No HI, no auditory hallucinations . Pt's mother dying of cancer, wife states pt hasn't been normal since he visited his dying mother 2 weeks ago. Pt has had multiple episodes of "panic attacks" since. Had similar episode 3 years ago but was in septic shock at that time due to prostate. Otherwise no history of panic attacks. Pt's PCP considering that similar problem of urologic issues and/or sepsis may be causing current episode.   Wife has several papers on which patient has been writing and scribbling excessively, many scribbles and disorganized words covering entire paper, some words written repeatedly. Pt sees Dr. Darleene Cleaver, so was sent here.

## 2016-09-13 NOTE — ED Notes (Signed)
Pt now stating "I feel all wet."

## 2016-09-13 NOTE — BH Assessment (Signed)
[facility] Assessment Progress Note    Case was staffed with Lu Duffel NP who recommended patient be re-evaluated in the a.m.

## 2016-09-13 NOTE — ED Notes (Addendum)
Pt stated "Can you get me a xanax?  Can you ask the doctor.  It helps me get 8 hours of sleep and fix the computer.  All you have to do is mash the off button.  I need to write it.  I need a pencil and paper.  You see all that on the computer screen.  I can fix the logo."  Pt with loose associations & tangential.  Pt informed no pencils or pens allowed.  Pt provided crayon & paper.

## 2016-09-13 NOTE — ED Notes (Signed)
Pt's spouse called, phone taken to pt.  Pt did not attempt to hold phone, was non-verbal during call.  Pt with spastic movements.

## 2016-09-14 DIAGNOSIS — F332 Major depressive disorder, recurrent severe without psychotic features: Secondary | ICD-10-CM | POA: Diagnosis present

## 2016-09-14 DIAGNOSIS — F410 Panic disorder [episodic paroxysmal anxiety] without agoraphobia: Secondary | ICD-10-CM | POA: Diagnosis not present

## 2016-09-14 DIAGNOSIS — G4700 Insomnia, unspecified: Secondary | ICD-10-CM | POA: Diagnosis not present

## 2016-09-14 DIAGNOSIS — R45851 Suicidal ideations: Secondary | ICD-10-CM | POA: Diagnosis not present

## 2016-09-14 MED ORDER — ALPRAZOLAM 1 MG PO TABS: 1.0000 mg | ORAL_TABLET | Freq: Every day | ORAL | Status: DC | PRN

## 2016-09-14 MED ORDER — IBUPROFEN 200 MG PO TABS: 600.0000 mg | ORAL_TABLET | Freq: Four times a day (QID) | ORAL | Status: DC | PRN

## 2016-09-14 MED ORDER — CLONAZEPAM 0.5 MG PO TABS
0.5000 mg | ORAL_TABLET | Freq: Two times a day (BID) | ORAL | Status: DC
  Administered 2016-09-14 – 2016-09-16 (×5): 0.5 mg via ORAL
  Filled 2016-09-14 (×5): qty 1

## 2016-09-14 MED ORDER — CLONAZEPAM 1 MG PO TABS
1.0000 mg | ORAL_TABLET | Freq: Once | ORAL | Status: DC
Start: 2016-09-14 — End: 2016-09-14

## 2016-09-14 MED ORDER — SERTRALINE HCL 50 MG PO TABS
100.0000 mg | ORAL_TABLET | Freq: Every day | ORAL | Status: DC
  Administered 2016-09-14 – 2016-09-16 (×3): 100 mg via ORAL
  Filled 2016-09-14 (×3): qty 2

## 2016-09-14 MED ORDER — ALPRAZOLAM 1 MG PO TABS
1.0000 mg | ORAL_TABLET | Freq: Two times a day (BID) | ORAL | Status: DC
  Administered 2016-09-14: 1 mg via ORAL

## 2016-09-14 MED ORDER — LORAZEPAM 2 MG/ML IJ SOLN
2.0000 mg | Freq: Once | INTRAMUSCULAR | Status: AC
  Administered 2016-09-14: 2 mg via INTRAMUSCULAR
  Filled 2016-09-14: qty 1

## 2016-09-14 NOTE — ED Notes (Addendum)
Pt up and to the BR with assistance.  Pt stated "Because I have OCD, I need to go to the BR before I wet myself.  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  I worked for [employer] before that but it got bought out and became [employer].  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  I'm trying to write without shaking because of this problem I have.  I cut my hair with a # 5 and I have a #3, you know my wife.  I see a psychiatrist but it's not a psyhiatric issue.  His name is Dr. Loni Muse.  He changed my meds some.  I need to sleep.  I haven't slept for 3 days nor eaten.  But I've been eating now.  I just have to write these things down.  I have 5 symbols I use.  O's, X's, brackets and lines.  O's are things I don't want to do, X's are things I'm not going to do."  Pt diaphoretic, assisted into new paper scrubs, linens changed & assisted back to bed.  Pt encouraged to sleep.  Sz pads remain in place.

## 2016-09-14 NOTE — ED Notes (Signed)
Pt again began tremoring, abnormal mouth movements, pt able to follow commands to assist with decreasing tremors & abnormal mouth movements.  Pt then began stating "I have found 1 element for fear.  That leads to me another element of fear.  I have found 1 element for fear."  Pt repeated approximately 5-6 times.

## 2016-09-14 NOTE — ED Notes (Signed)
Pt up to BR.  After returning from BR, pt began shaking, speech became pressured & tangential.  Pt stating "I have memory problems.  I have to write notes.  You can't look at them until they're done."  Pt is writing erratically and is unledgible.

## 2016-09-14 NOTE — ED Notes (Signed)
Pt up OOB, stool noted on bed linens.

## 2016-09-14 NOTE — Progress Notes (Signed)
Per psychiatrist Darleene Cleaver and DNP Reita Cliche, patient meets inpatient criteria. CSW faxed patient's referral to: [facility], [facility], [place], [place], [place], [place].

## 2016-09-14 NOTE — ED Notes (Signed)
Pt cleaned of stool, linens changed.  Pt placed back in bed & encouraged to go to sleep.

## 2016-09-14 NOTE — Consult Note (Signed)
[facility] Psychiatry Consult   Reason for Consult:  Psychiatric evaluation Referring Physician: EDP Patient Identification: Calvin Santiago MRN:  [id number] Principal Diagnosis: Major depressive disorder, recurrent episode, severe ([place]) Diagnosis:   Patient Active Problem List   Diagnosis Date Noted  . Panic disorder (episodic paroxysmal anxiety) [F41.0] 09/14/2016  . Major depressive disorder, recurrent episode, severe ([place]) [F33.2] 09/14/2016    Total Time spent with patient: 45 minutes  Subjective:   Calvin Santiago is a 71 y.o. male patient admitted with thoughts of self harm.  HPI:  Patient with history of MDD and Panic disorder who was brought by his family due to worsening depressive symptoms since he visited his mother 2 weeks ago, patient's mother has been diagnosed with terminal cancer. Patient has been acting bizarre, reporting increased anxiety, panic attacks and frequent thoughts of self harm. Patient reports being hopeless, helpless, low energy level, lack of motivation and inability to sleep.  Past Psychiatric History: as above  Risk to Self: Suicidal Ideation: Yes-Currently Present Suicidal Intent: No Is patient at risk for suicide?: Yes Suicidal Plan?: No Access to Means: No What has been your use of drugs/alcohol within the last 12 months?: Denies How many times?: 1 (20 years ago pt is vague) Other Self Harm Risks: Unknown Triggers for Past Attempts: Unknown Intentional Self Injurious Behavior: None Risk to Others: Homicidal Ideation: No Thoughts of Harm to Others: No Current Homicidal Intent: No Current Homicidal Plan: No Access to Homicidal Means: No Identified Victim: NA History of harm to others?: No Assessment of Violence: None Noted Violent Behavior Description: NA Does patient have access to weapons?: No Criminal Charges Pending?: No Does patient have a court date: No Prior Inpatient Therapy: Prior Inpatient Therapy: Yes Prior Therapy Dates:  (Pt  stated 20 years ago) Prior Therapy Facilty/Provider(s):  ([facility]) Reason for Treatment: S/I Prior Outpatient Therapy: Prior Outpatient Therapy: Yes Prior Therapy Dates: Ongoing Prior Therapy Facilty/Provider(s): Marcille Barman MD Reason for Treatment: MH issues Does patient have an ACCT team?: No Does patient have Intensive In-House Services?  : No Does patient have [facility] services? : No Does patient have P4CC services?: No  Past Medical History:  Past Medical History:  Diagnosis Date  . Anxiety   . Bipolar 2 disorder [facility])     Past Surgical History:  Procedure Laterality Date  . REPLACEMENT TOTAL KNEE     bilateral   Family History: History reviewed. No pertinent family history. Family Psychiatric  History:  Social History:  History  Alcohol use Not on file     History  Drug use: Unknown    Social History   Social History  . Marital status: Married    Spouse name: N/A  . Number of children: N/A  . Years of education: N/A   Social History Main Topics  . Smoking status: None  . Smokeless tobacco: None  . Alcohol use None  . Drug use: Unknown  . Sexual activity: Not Asked   Other Topics Concern  . None   Social History Narrative  . None   Additional Social History:    Allergies:   Allergies  Allergen Reactions  . Sulfa Antibiotics Other (See Comments)    As a child, he ran into walls    Labs:  Results for orders placed or performed during the hospital encounter of 09/13/16 (from the past 48 hour(s))  Rapid urine drug screen (hospital performed)     Status: Abnormal   Collection Time: 09/13/16 10:17 AM  Result Value Ref  Range   Opiates NONE DETECTED NONE DETECTED   Cocaine NONE DETECTED NONE DETECTED   Benzodiazepines POSITIVE (A) NONE DETECTED   Amphetamines NONE DETECTED NONE DETECTED   Tetrahydrocannabinol NONE DETECTED NONE DETECTED   Barbiturates NONE DETECTED NONE DETECTED    Comment:        DRUG SCREEN FOR MEDICAL PURPOSES ONLY.  IF CONFIRMATION  IS NEEDED FOR ANY PURPOSE, NOTIFY LAB WITHIN 5 DAYS.        LOWEST DETECTABLE LIMITS FOR URINE DRUG SCREEN Drug Class       Cutoff (ng/mL) Amphetamine      1000 Barbiturate      200 Benzodiazepine   161 Tricyclics       096 Opiates          300 Cocaine          300 THC              50   Comprehensive metabolic panel     Status: Abnormal   Collection Time: 09/13/16 11:25 AM  Result Value Ref Range   Sodium 139 135 - 145 mmol/L   Potassium 5.1 3.5 - 5.1 mmol/L   Chloride 105 101 - 111 mmol/L   CO2 23 22 - 32 mmol/L   Glucose, Bld 113 (H) 65 - 99 mg/dL   BUN 17 6 - 20 mg/dL   Creatinine, Ser 1.02 0.61 - 1.24 mg/dL   Calcium 9.5 8.9 - 10.3 mg/dL   Total Protein 7.6 6.5 - 8.1 g/dL   Albumin 4.5 3.5 - 5.0 g/dL   AST 49 (H) 15 - 41 U/L   ALT 43 17 - 63 U/L   Alkaline Phosphatase 56 38 - 126 U/L   Total Bilirubin 1.7 (H) 0.3 - 1.2 mg/dL   GFR calc non Af Amer >60 >60 mL/min   GFR calc Af Amer >60 >60 mL/min    Comment: (NOTE) The eGFR has been calculated using the CKD EPI equation. This calculation has not been validated in all clinical situations. eGFR's persistently <60 mL/min signify possible Chronic Kidney Disease.    Anion gap 11 5 - 15  Ethanol     Status: None   Collection Time: 09/13/16 11:25 AM  Result Value Ref Range   Alcohol, Ethyl (B) <5 <5 mg/dL    Comment:        LOWEST DETECTABLE LIMIT FOR SERUM ALCOHOL IS 5 mg/dL FOR MEDICAL PURPOSES ONLY   Salicylate level     Status: None   Collection Time: 09/13/16 11:25 AM  Result Value Ref Range   Salicylate Lvl <0.4 2.8 - 30.0 mg/dL  Acetaminophen level     Status: Abnormal   Collection Time: 09/13/16 11:25 AM  Result Value Ref Range   Acetaminophen (Tylenol), Serum <10 (L) 10 - 30 ug/mL    Comment:        THERAPEUTIC CONCENTRATIONS VARY SIGNIFICANTLY. A RANGE OF 10-30 ug/mL MAY BE AN EFFECTIVE CONCENTRATION FOR MANY PATIENTS. HOWEVER, SOME ARE BEST TREATED AT CONCENTRATIONS OUTSIDE  THIS RANGE. ACETAMINOPHEN CONCENTRATIONS >150 ug/mL AT 4 HOURS AFTER INGESTION AND >50 ug/mL AT 12 HOURS AFTER INGESTION ARE OFTEN ASSOCIATED WITH TOXIC REACTIONS.   cbc     Status: None   Collection Time: 09/13/16 11:25 AM  Result Value Ref Range   WBC 9.1 4.0 - 10.5 K/uL   RBC 5.13 4.22 - 5.81 MIL/uL   Hemoglobin 14.8 13.0 - 17.0 g/dL   HCT 42.7 39.0 - 52.0 %   MCV 83.2  78.0 - 100.0 fL   MCH 28.8 26.0 - 34.0 pg   MCHC 34.7 30.0 - 36.0 g/dL   RDW 13.6 11.5 - 15.5 %   Platelets 264 150 - 400 K/uL  Urinalysis, Routine w reflex microscopic     Status: Abnormal   Collection Time: 09/13/16 11:25 AM  Result Value Ref Range   Color, Urine YELLOW YELLOW   APPearance CLEAR CLEAR   Specific Gravity, Urine 1.018 1.005 - 1.030   pH 5.0 5.0 - 8.0   Glucose, UA NEGATIVE NEGATIVE mg/dL   Hgb urine dipstick SMALL (A) NEGATIVE   Bilirubin Urine NEGATIVE NEGATIVE   Ketones, ur NEGATIVE NEGATIVE mg/dL   Protein, ur NEGATIVE NEGATIVE mg/dL   Nitrite NEGATIVE NEGATIVE   Leukocytes, UA NEGATIVE NEGATIVE   RBC / HPF 0-5 0 - 5 RBC/hpf   WBC, UA 0-5 0 - 5 WBC/hpf   Bacteria, UA NONE SEEN NONE SEEN   Squamous Epithelial / LPF 0-5 (A) NONE SEEN   Mucous PRESENT   I-Stat CG4 Lactic Acid, ED     Status: None   Collection Time: 09/13/16 11:37 AM  Result Value Ref Range   Lactic Acid, Venous 1.13 0.5 - 1.9 mmol/L  I-Stat CG4 Lactic Acid, ED     Status: None   Collection Time: 09/13/16  1:49 PM  Result Value Ref Range   Lactic Acid, Venous 0.91 0.5 - 1.9 mmol/L    Current Facility-Administered Medications  Medication Dose Route Frequency Provider Last Rate Last Dose  . acetaminophen (TYLENOL) tablet 1,000 mg  1,000 mg Oral Q6H PRN Duffy Bruce, MD      . alum & mag hydroxide-simeth (MAALOX/MYLANTA) 200-200-20 MG/5ML suspension 30 mL  30 mL Oral Q6H PRN Duffy Bruce, MD      . amLODipine (NORVASC) tablet 5 mg  5 mg Oral Daily Duffy Bruce, MD      . clonazePAM Bobbye Charleston) tablet 0.5 mg   0.5 mg Oral BID Samson Ralph, MD      . ibuprofen (ADVIL,MOTRIN) tablet 600 mg  600 mg Oral Q6H PRN Duffy Bruce, MD      . LORazepam (ATIVAN) injection 2 mg  2 mg Intramuscular Once Tenea Sens, MD      . ondansetron (ZOFRAN) tablet 4 mg  4 mg Oral Q8H PRN Duffy Bruce, MD      . sertraline (ZOLOFT) tablet 100 mg  100 mg Oral Daily Corena Pilgrim, MD       Current Outpatient Prescriptions  Medication Sig Dispense Refill  . acetaminophen (TYLENOL) 500 MG tablet Take 1,000 mg by mouth every 6 (six) hours as needed for moderate pain.    Marland Kitchen ALPRAZolam (XANAX) 1 MG tablet Take 1 mg by mouth See admin instructions. 1 mg twice a day, 1 mg additional prn for anxiety    . amLODipine (NORVASC) 10 MG tablet Take 5 mg by mouth daily.    Marland Kitchen ibuprofen (ADVIL,MOTRIN) 200 MG tablet Take 800 mg by mouth every 6 (six) hours as needed for moderate pain.    Marland Kitchen sertraline (ZOLOFT) 100 MG tablet Take 100 mg by mouth daily.    Marland Kitchen zolpidem (AMBIEN) 5 MG tablet Take 1 tablet (5 mg total) by mouth at bedtime as needed for sleep. 10 tablet 0    Musculoskeletal: Strength & Muscle Tone: within normal limits Gait & Station: unsteady Patient leans: Front  Psychiatric Specialty Exam: Physical Exam  Psychiatric: His mood appears anxious. His speech is delayed and tangential. He is combative. Cognition  and memory are normal. He expresses impulsivity. He expresses suicidal ideation.    Review of Systems  Constitutional: Negative.   HENT: Negative.   Eyes: Negative.   Respiratory: Negative.   Cardiovascular: Negative.   Gastrointestinal: Negative.   Genitourinary: Negative.   Musculoskeletal: Negative.   Skin: Negative.   Neurological: Positive for tremors.  Endo/Heme/Allergies: Negative.   Psychiatric/Behavioral: Positive for depression and suicidal ideas. The patient is nervous/anxious and has insomnia.     Blood pressure (!) 159/98, pulse 93, temperature 99 F (37.2 C), temperature source Oral,  resp. rate 20, SpO2 93 %.There is no height or weight on file to calculate BMI.  General Appearance: Casual  Eye Contact:  Minimal  Speech:  Slow  Volume:  Decreased  Mood:  Anxious  Affect:  Constricted  Thought Process:  Disorganized  Orientation:  Full (Time, Place, and Person)  Thought Content:  Illogical  Suicidal Thoughts:  Yes.  without intent/plan  Homicidal Thoughts:  No  Memory:  Immediate;   Fair Recent;   Fair Remote;   Fair  Judgement:  Poor  Insight:  Lacking  Psychomotor Activity:  Restlessness  Concentration:  Concentration: Fair and Attention Span: Fair  Recall:  [employer] of Knowledge:  Fair  Language:  Fair  Akathisia:  No  Handed:  Right  AIMS (if indicated):     Assets:  Social Support  ADL's:  Intact  Cognition:  WNL  Sleep:   poor     Treatment Plan Summary: Daily contact with patient to assess and evaluate symptoms and progress in treatment and Medication management Continue Sertraline 100 mg daily for depression/anxiety and Clonazepam 0.5 mg bid for panic attacks.  Disposition: Recommend psychiatric Inpatient admission when medically cleared.  Corena Pilgrim, MD 09/14/2016 10:59 AM

## 2016-09-14 NOTE — ED Notes (Signed)
Pt with seizure like activity.  HOB lowered, pt placed on side, pt with snoring sounds, no incontinence noted, pt not responding to verbal stimuli.  Dr. Tomi Bamberger informed.

## 2016-09-15 DIAGNOSIS — G4700 Insomnia, unspecified: Secondary | ICD-10-CM | POA: Diagnosis not present

## 2016-09-15 DIAGNOSIS — F332 Major depressive disorder, recurrent severe without psychotic features: Secondary | ICD-10-CM | POA: Diagnosis not present

## 2016-09-15 DIAGNOSIS — F410 Panic disorder [episodic paroxysmal anxiety] without agoraphobia: Secondary | ICD-10-CM | POA: Diagnosis not present

## 2016-09-15 MED ORDER — HALOPERIDOL LACTATE 5 MG/ML IJ SOLN
5.0000 mg | Freq: Once | INTRAMUSCULAR | Status: AC
  Administered 2016-09-15: 5 mg via INTRAMUSCULAR
  Filled 2016-09-15: qty 1

## 2016-09-15 MED ORDER — LORAZEPAM 1 MG PO TABS
1.0000 mg | ORAL_TABLET | Freq: Once | ORAL | Status: AC
  Administered 2016-09-15: 1 mg via ORAL
  Filled 2016-09-15: qty 1

## 2016-09-15 NOTE — ED Notes (Signed)
Patient continuing to attempt to get out of bed. Very unsteady on feet. Patient incontinent of urine. Bed linen changed and patient placed in a brief and given new scrub pants. Bed alarm activated.

## 2016-09-15 NOTE — Consult Note (Signed)
[facility] Psych ED Progress Note  09/15/2016 10:44 AM Calvin Santiago  MRN:  [id number] Subjective: "I am feeling anxious, I need help to sleep, I guess I have been eating well now.''  Objective:Patient seen, interviewed, chart reviewed and treatment plan discussed with our team. Patient is a poor historian who reports ongoing anxiety, trouble sleeping, panic attacks and depression. Patient has been acting bizarre, endorsing low energy level, lack of motivation and memory lapses. He denies psychosis or delusions.  Principal Problem: Major depressive disorder, recurrent episode, severe ([place]) Diagnosis:   Patient Active Problem List   Diagnosis Date Noted  . Panic disorder (episodic paroxysmal anxiety) [F41.0] 09/14/2016  . Major depressive disorder, recurrent episode, severe ([place]) [F33.2] 09/14/2016   Total Time spent with patient: 30 minutes  Past Psychiatric History: as above  Past Medical History:  Past Medical History:  Diagnosis Date  . Anxiety   . Bipolar 2 disorder [facility])     Past Surgical History:  Procedure Laterality Date  . REPLACEMENT TOTAL KNEE     bilateral   Family History: History reviewed. No pertinent family history. Family Psychiatric  History: Social History:  History  Alcohol use Not on file     History  Drug use: Unknown    Social History   Social History  . Marital status: Married    Spouse name: N/A  . Number of children: N/A  . Years of education: N/A   Social History Main Topics  . Smoking status: None  . Smokeless tobacco: None  . Alcohol use None  . Drug use: Unknown  . Sexual activity: Not Asked   Other Topics Concern  . None   Social History Narrative  . None    Sleep: Poor  Appetite:  Fair  Current Medications: Current Facility-Administered Medications  Medication Dose Route Frequency Provider Last Rate Last Dose  . acetaminophen (TYLENOL) tablet 1,000 mg  1,000 mg Oral Q6H PRN Duffy Bruce, MD   1,000 mg at 09/14/16 1101  .  alum & mag hydroxide-simeth (MAALOX/MYLANTA) 200-200-20 MG/5ML suspension 30 mL  30 mL Oral Q6H PRN Duffy Bruce, MD      . amLODipine (NORVASC) tablet 5 mg  5 mg Oral Daily Duffy Bruce, MD   5 mg at 09/15/16 1004  . clonazePAM (KLONOPIN) tablet 0.5 mg  0.5 mg Oral BID Thomas Rhude, MD   0.5 mg at 09/15/16 1004  . ibuprofen (ADVIL,MOTRIN) tablet 600 mg  600 mg Oral Q6H PRN Duffy Bruce, MD      . ondansetron [facility]) tablet 4 mg  4 mg Oral Q8H PRN Duffy Bruce, MD      . sertraline (ZOLOFT) tablet 100 mg  100 mg Oral Daily Jermone Geister, MD   100 mg at 09/15/16 1004   Current Outpatient Prescriptions  Medication Sig Dispense Refill  . acetaminophen (TYLENOL) 500 MG tablet Take 1,000 mg by mouth every 6 (six) hours as needed for moderate pain.    Marland Kitchen ALPRAZolam (XANAX) 1 MG tablet Take 1 mg by mouth See admin instructions. 1 mg twice a day, 1 mg additional prn for anxiety    . amLODipine (NORVASC) 10 MG tablet Take 5 mg by mouth daily.    Marland Kitchen ibuprofen (ADVIL,MOTRIN) 200 MG tablet Take 800 mg by mouth every 6 (six) hours as needed for moderate pain.    Marland Kitchen sertraline (ZOLOFT) 100 MG tablet Take 100 mg by mouth daily.    Marland Kitchen zolpidem (AMBIEN) 5 MG tablet Take 1 tablet (5 mg total)  by mouth at bedtime as needed for sleep. 10 tablet 0    Lab Results:  Results for orders placed or performed during the hospital encounter of 09/13/16 (from the past 48 hour(s))  Comprehensive metabolic panel     Status: Abnormal   Collection Time: 09/13/16 11:25 AM  Result Value Ref Range   Sodium 139 135 - 145 mmol/L   Potassium 5.1 3.5 - 5.1 mmol/L   Chloride 105 101 - 111 mmol/L   CO2 23 22 - 32 mmol/L   Glucose, Bld 113 (H) 65 - 99 mg/dL   BUN 17 6 - 20 mg/dL   Creatinine, Ser 1.02 0.61 - 1.24 mg/dL   Calcium 9.5 8.9 - 10.3 mg/dL   Total Protein 7.6 6.5 - 8.1 g/dL   Albumin 4.5 3.5 - 5.0 g/dL   AST 49 (H) 15 - 41 U/L   ALT 43 17 - 63 U/L   Alkaline Phosphatase 56 38 - 126 U/L   Total  Bilirubin 1.7 (H) 0.3 - 1.2 mg/dL   GFR calc non Af Amer >60 >60 mL/min   GFR calc Af Amer >60 >60 mL/min    Comment: (NOTE) The eGFR has been calculated using the CKD EPI equation. This calculation has not been validated in all clinical situations. eGFR's persistently <60 mL/min signify possible Chronic Kidney Disease.    Anion gap 11 5 - 15  Ethanol     Status: None   Collection Time: 09/13/16 11:25 AM  Result Value Ref Range   Alcohol, Ethyl (B) <5 <5 mg/dL    Comment:        LOWEST DETECTABLE LIMIT FOR SERUM ALCOHOL IS 5 mg/dL FOR MEDICAL PURPOSES ONLY   Salicylate level     Status: None   Collection Time: 09/13/16 11:25 AM  Result Value Ref Range   Salicylate Lvl <5.3 2.8 - 30.0 mg/dL  Acetaminophen level     Status: Abnormal   Collection Time: 09/13/16 11:25 AM  Result Value Ref Range   Acetaminophen (Tylenol), Serum <10 (L) 10 - 30 ug/mL    Comment:        THERAPEUTIC CONCENTRATIONS VARY SIGNIFICANTLY. A RANGE OF 10-30 ug/mL MAY BE AN EFFECTIVE CONCENTRATION FOR MANY PATIENTS. HOWEVER, SOME ARE BEST TREATED AT CONCENTRATIONS OUTSIDE THIS RANGE. ACETAMINOPHEN CONCENTRATIONS >150 ug/mL AT 4 HOURS AFTER INGESTION AND >50 ug/mL AT 12 HOURS AFTER INGESTION ARE OFTEN ASSOCIATED WITH TOXIC REACTIONS.   cbc     Status: None   Collection Time: 09/13/16 11:25 AM  Result Value Ref Range   WBC 9.1 4.0 - 10.5 K/uL   RBC 5.13 4.22 - 5.81 MIL/uL   Hemoglobin 14.8 13.0 - 17.0 g/dL   HCT 42.7 39.0 - 52.0 %   MCV 83.2 78.0 - 100.0 fL   MCH 28.8 26.0 - 34.0 pg   MCHC 34.7 30.0 - 36.0 g/dL   RDW 13.6 11.5 - 15.5 %   Platelets 264 150 - 400 K/uL  Urinalysis, Routine w reflex microscopic     Status: Abnormal   Collection Time: 09/13/16 11:25 AM  Result Value Ref Range   Color, Urine YELLOW YELLOW   APPearance CLEAR CLEAR   Specific Gravity, Urine 1.018 1.005 - 1.030   pH 5.0 5.0 - 8.0   Glucose, UA NEGATIVE NEGATIVE mg/dL   Hgb urine dipstick SMALL (A) NEGATIVE    Bilirubin Urine NEGATIVE NEGATIVE   Ketones, ur NEGATIVE NEGATIVE mg/dL   Protein, ur NEGATIVE NEGATIVE mg/dL   Nitrite NEGATIVE NEGATIVE  Leukocytes, UA NEGATIVE NEGATIVE   RBC / HPF 0-5 0 - 5 RBC/hpf   WBC, UA 0-5 0 - 5 WBC/hpf   Bacteria, UA NONE SEEN NONE SEEN   Squamous Epithelial / LPF 0-5 (A) NONE SEEN   Mucous PRESENT   I-Stat CG4 Lactic Acid, ED     Status: None   Collection Time: 09/13/16 11:37 AM  Result Value Ref Range   Lactic Acid, Venous 1.13 0.5 - 1.9 mmol/L  I-Stat CG4 Lactic Acid, ED     Status: None   Collection Time: 09/13/16  1:49 PM  Result Value Ref Range   Lactic Acid, Venous 0.91 0.5 - 1.9 mmol/L    Blood Alcohol level:  Lab Results  Component Value Date   ETH <5 09/13/2016    Physical Findings: AIMS:  , ,  ,  ,    CIWA:    COWS:     Musculoskeletal: Strength & Muscle Tone: within normal limits Gait & Station: unsteady Patient leans: Front  Psychiatric Specialty Exam: Physical Exam  Psychiatric: Judgment and thought content normal. His mood appears anxious. His speech is delayed and tangential. He is slowed and combative. Cognition and memory are normal. He exhibits a depressed mood.    Review of Systems  Constitutional: Positive for malaise/fatigue.  HENT: Negative.   Eyes: Negative.   Respiratory: Negative.   Cardiovascular: Negative.   Gastrointestinal: Negative.   Genitourinary: Positive for urgency.  Musculoskeletal: Positive for myalgias.  Skin: Negative.   Neurological: Positive for tremors.  Endo/Heme/Allergies: Negative.   Psychiatric/Behavioral: Positive for depression. The patient is nervous/anxious and has insomnia.     Blood pressure (!) 166/78, pulse 90, temperature 98.2 F (36.8 C), temperature source Oral, resp. rate 18, SpO2 91 %.There is no height or weight on file to calculate BMI.  General Appearance: Casual  Eye Contact:  Minimal  Speech:  Slow  Volume:  Decreased  Mood:  Anxious and Dysphoric  Affect:   Constricted  Thought Process:  Disorganized  Orientation:  Other:  only to person and place  Thought Content:  Illogical and Rumination  Suicidal Thoughts:  No  Homicidal Thoughts:  No  Memory:  Immediate;   Fair Recent;   Poor Remote;   Fair  Judgement:  Impaired  Insight:  Shallow  Psychomotor Activity:  Psychomotor Retardation and Restlessness  Concentration:  Concentration: Fair and Attention Span: Fair  Recall:  [employer] of Knowledge:  Fair  Language:  Good  Akathisia:  No  Handed:  Right  AIMS (if indicated):     Assets:  Social Support  ADL's:  Impaired  Cognition:  WNL  Sleep:   poor      Treatment Plan Summary: Daily contact with patient to assess and evaluate symptoms and progress in treatment and Medication management.  Continue Sertraline 100 mg daily for depression/anxiety and Clonazepam 0.5 mg bid for panic attacks.  Disposition: Recommend psychiatric Inpatient admission when medically cleared.                      Patient will benefit from [facility] inpatient placement  Corena Pilgrim, MD 09/15/2016, 10:44 AM

## 2016-09-15 NOTE — ED Notes (Signed)
AM meds given.

## 2016-09-15 NOTE — Progress Notes (Signed)
CSW contacted the following facilities to inquire about patient's referral: [facility], [facility], [facility], [place], [facility], [facility].  At [facility] staff requested that CSW resend patient's referral.  [facility] staff requested that CSW resend patient's referral.   No answer from [facility], [place] and [facility].   CSW refaxed patient's referral to: [facility], [facility], [facility], [facility] and [facility].   Patient's wife requested to be notified when patient receives placement offer from geriatric psychiatric inpatient hospital.   Abundio Miu, [facility]  Clinical Social Worker [phone]

## 2016-09-15 NOTE — ED Notes (Signed)
Report given to [place], rn.

## 2016-09-15 NOTE — ED Notes (Signed)
MASD noted to rectum and perineum areas. Barrier cream applied after shower.

## 2016-09-15 NOTE — ED Notes (Signed)
Pt constantly redirected to sit or lay down on bed to prevent falling. Pt unable to sleep through out the nigh shift

## 2016-09-15 NOTE — Progress Notes (Signed)
Tim, from [facility] facility called to inquire about patient. Tim reported that patient is being placed on wait-list pending insurance verification. Tim reported that patient could possibly be accepted tomorrow since they have a couple of discharges scheduled. He also reported that insurance representative would be in tomorrow and will work on insurance then. Hale Bogus.

## 2016-09-16 DIAGNOSIS — F0280 Dementia in other diseases classified elsewhere without behavioral disturbance: Secondary | ICD-10-CM | POA: Diagnosis present

## 2016-09-16 DIAGNOSIS — I10 Essential (primary) hypertension: Secondary | ICD-10-CM | POA: Diagnosis present

## 2016-09-16 DIAGNOSIS — F332 Major depressive disorder, recurrent severe without psychotic features: Secondary | ICD-10-CM | POA: Diagnosis not present

## 2016-09-16 DIAGNOSIS — R443 Hallucinations, unspecified: Secondary | ICD-10-CM | POA: Diagnosis not present

## 2016-09-16 DIAGNOSIS — Z87891 Personal history of nicotine dependence: Secondary | ICD-10-CM | POA: Diagnosis not present

## 2016-09-16 DIAGNOSIS — Z8546 Personal history of malignant neoplasm of prostate: Secondary | ICD-10-CM | POA: Diagnosis not present

## 2016-09-16 DIAGNOSIS — R4182 Altered mental status, unspecified: Secondary | ICD-10-CM | POA: Diagnosis not present

## 2016-09-16 DIAGNOSIS — F410 Panic disorder [episodic paroxysmal anxiety] without agoraphobia: Secondary | ICD-10-CM | POA: Diagnosis not present

## 2016-09-16 DIAGNOSIS — F4001 Agoraphobia with panic disorder: Secondary | ICD-10-CM | POA: Diagnosis present

## 2016-09-16 DIAGNOSIS — G309 Alzheimer's disease, unspecified: Secondary | ICD-10-CM | POA: Diagnosis present

## 2016-09-16 DIAGNOSIS — F329 Major depressive disorder, single episode, unspecified: Secondary | ICD-10-CM | POA: Diagnosis not present

## 2016-09-16 DIAGNOSIS — G4700 Insomnia, unspecified: Secondary | ICD-10-CM | POA: Diagnosis not present

## 2016-09-16 DIAGNOSIS — M1990 Unspecified osteoarthritis, unspecified site: Secondary | ICD-10-CM | POA: Diagnosis present

## 2016-09-16 DIAGNOSIS — Z79899 Other long term (current) drug therapy: Secondary | ICD-10-CM | POA: Diagnosis not present

## 2016-09-16 DIAGNOSIS — F3160 Bipolar disorder, current episode mixed, unspecified: Secondary | ICD-10-CM | POA: Diagnosis present

## 2016-09-16 DIAGNOSIS — R45851 Suicidal ideations: Secondary | ICD-10-CM | POA: Diagnosis present

## 2016-09-16 MED ORDER — TRAZODONE HCL 100 MG PO TABS: 100.0000 mg | ORAL_TABLET | Freq: Every day | ORAL | Status: DC

## 2016-09-16 MED ORDER — SERTRALINE HCL 50 MG PO TABS: 150.0000 mg | ORAL_TABLET | Freq: Every day | ORAL | Status: DC

## 2016-09-16 MED ORDER — CLONAZEPAM 1 MG PO TABS: 1.0000 mg | ORAL_TABLET | Freq: Two times a day (BID) | ORAL | Status: DC

## 2016-09-16 MED ORDER — CLONAZEPAM 0.5 MG PO TABS
0.5000 mg | ORAL_TABLET | Freq: Three times a day (TID) | ORAL | Status: DC
  Administered 2016-09-16: 0.5 mg via ORAL
  Filled 2016-09-16: qty 1

## 2016-09-16 MED ORDER — CARBAMAZEPINE ER 200 MG PO TB12: 200.0000 mg | ORAL_TABLET | Freq: Two times a day (BID) | ORAL | Status: DC

## 2016-09-16 NOTE — Consult Note (Signed)
[facility] Psych ED Progress Note  09/16/2016 11:09 AM Calvin Santiago  MRN:  [id number] Subjective: "my anxiety is really bad and I cannot sleep at all.''  Objective: Patient seen at his bedside in the presence of his wife. Patient was interviewed, chart reviewed and treatment plan discussed with our team. Patient reports that he is still not able to sleep, having panic attacks, getting apprehensive  and worries a lot about his mother who has been diagnosed with terminal cancer. Patient has been eating better but still endorsing hopelessness, low energy level, lack of motivation and depressive symptoms. Patient denies psychosis or delusions.  Principal Problem: Major depressive disorder, recurrent episode, severe ([place]) Diagnosis:   Patient Active Problem List   Diagnosis Date Noted  . Panic disorder (episodic paroxysmal anxiety) [F41.0] 09/14/2016  . Major depressive disorder, recurrent episode, severe ([place]) [F33.2] 09/14/2016   Total Time spent with patient: 30 minutes  Past Psychiatric History: as above  Past Medical History:  Past Medical History:  Diagnosis Date  . Anxiety   . Bipolar 2 disorder [facility])     Past Surgical History:  Procedure Laterality Date  . REPLACEMENT TOTAL KNEE     bilateral   Family History: History reviewed. No pertinent family history. Family Psychiatric  History: Social History:  History  Alcohol use Not on file     History  Drug use: Unknown    Social History   Social History  . Marital status: Married    Spouse name: N/A  . Number of children: N/A  . Years of education: N/A   Social History Main Topics  . Smoking status: None  . Smokeless tobacco: None  . Alcohol use None  . Drug use: Unknown  . Sexual activity: Not Asked   Other Topics Concern  . None   Social History Narrative  . None    Sleep: Poor  Appetite:  Fair  Current Medications: Current Facility-Administered Medications  Medication Dose Route Frequency Provider Last Rate  Last Dose  . acetaminophen (TYLENOL) tablet 1,000 mg  1,000 mg Oral Q6H PRN Duffy Bruce, MD   1,000 mg at 09/14/16 1101  . alum & mag hydroxide-simeth (MAALOX/MYLANTA) 200-200-20 MG/5ML suspension 30 mL  30 mL Oral Q6H PRN Duffy Bruce, MD      . amLODipine (NORVASC) tablet 5 mg  5 mg Oral Daily Duffy Bruce, MD   5 mg at 09/16/16 0924  . clonazePAM (KLONOPIN) tablet 1 mg  1 mg Oral BID Deontra Pereyra, MD      . ibuprofen (ADVIL,MOTRIN) tablet 600 mg  600 mg Oral Q6H PRN Duffy Bruce, MD      . ondansetron [facility]) tablet 4 mg  4 mg Oral Q8H PRN Duffy Bruce, MD      . Derrill Memo ON 09/17/2016] sertraline (ZOLOFT) tablet 150 mg  150 mg Oral Daily Corena Pilgrim, MD       Current Outpatient Prescriptions  Medication Sig Dispense Refill  . acetaminophen (TYLENOL) 500 MG tablet Take 1,000 mg by mouth every 6 (six) hours as needed for moderate pain.    Marland Kitchen ALPRAZolam (XANAX) 1 MG tablet Take 1 mg by mouth See admin instructions. 1 mg twice a day, 1 mg additional prn for anxiety    . amLODipine (NORVASC) 10 MG tablet Take 5 mg by mouth daily.    Marland Kitchen ibuprofen (ADVIL,MOTRIN) 200 MG tablet Take 800 mg by mouth every 6 (six) hours as needed for moderate pain.    Marland Kitchen sertraline (ZOLOFT) 100 MG tablet  Take 100 mg by mouth daily.    Marland Kitchen zolpidem (AMBIEN) 5 MG tablet Take 1 tablet (5 mg total) by mouth at bedtime as needed for sleep. 10 tablet 0    Lab Results:  No results found for this or any previous visit (from the past 48 hour(s)).  Blood Alcohol level:  Lab Results  Component Value Date   ETH <5 09/13/2016    Physical Findings: AIMS:  , ,  ,  ,    CIWA:    COWS:     Musculoskeletal: Strength & Muscle Tone: within normal limits Gait & Station: unsteady Patient leans: Front  Psychiatric Specialty Exam: Physical Exam  Psychiatric: Judgment and thought content normal. His mood appears anxious. His speech is delayed and tangential. He is slowed and combative. Cognition and memory  are normal. He exhibits a depressed mood.    Review of Systems  Constitutional: Positive for malaise/fatigue.  HENT: Negative.   Eyes: Negative.   Respiratory: Negative.   Cardiovascular: Negative.   Gastrointestinal: Negative.   Genitourinary: Positive for urgency.  Musculoskeletal: Positive for myalgias.  Skin: Negative.   Neurological: Positive for tremors.  Endo/Heme/Allergies: Negative.   Psychiatric/Behavioral: Positive for depression. The patient is nervous/anxious and has insomnia.     Blood pressure (!) 150/84, pulse 85, temperature 98.5 F (36.9 C), temperature source Oral, resp. rate 19, SpO2 92 %.There is no height or weight on file to calculate BMI.  General Appearance: Casual  Eye Contact:  Minimal  Speech:  Slow  Volume:  Decreased  Mood:  Anxious and Dysphoric  Affect:  Constricted  Thought Process:  Disorganized  Orientation:  Other:  only to person and place  Thought Content:  Illogical and Rumination  Suicidal Thoughts:  No  Homicidal Thoughts:  No  Memory:  Immediate;   Fair Recent;   Poor Remote;   Fair  Judgement:  Impaired  Insight:  Shallow  Psychomotor Activity:  Psychomotor Retardation and Restlessness  Concentration:  Concentration: Fair and Attention Span: Fair  Recall:  [employer] of Knowledge:  Fair  Language:  Good  Akathisia:  No  Handed:  Right  AIMS (if indicated):     Assets:  Social Support  ADL's:  Impaired  Cognition:  WNL  Sleep:   poor      Treatment Plan Summary: Daily contact with patient to assess and evaluate symptoms and progress in treatment and Medication management. -Increase  Sertraline to 150 mg daily for depression/anxiety and Clonazepam to 0.5 mg tid for panic attacks.  Disposition: Recommend psychiatric Inpatient admission when medically cleared.                      Patient will benefit from [facility] inpatient placement  Corena Pilgrim, MD 09/16/2016, 11:09 AM

## 2016-09-16 NOTE — ED Notes (Addendum)
Pt out of room stating, "Where's Calvin Calvin?  She was sitting there."  Pt pointing to chair where sitter has been sitting.  Informed pt it is 0540 in the morning.  Pt re-directed back into room and assisted to bed.

## 2016-09-16 NOTE — Progress Notes (Signed)
CSW received a call from [facility] stating they are considering offering the pt a bed at this time.  Please reconsult if future social work needs arise.    Calvin Santiago. Calvin Santiago, Calvin Santiago, CSI Clinical Social Worker Ph: [phone]

## 2016-09-16 NOTE — BH Assessment (Addendum)
[place] Assessment Progress Note  Per Corena Pilgrim, MD, this pt requires psychiatric hospitalization at this time.  Pt presents under IVC initiated by Dr Darleene Cleaver.  At 14:30 Pamala Hurry calls from [facility] to report that pt has been accepted to their facility by Dr Caren Hazy to the [place] [facility], Rm 152-2.  Waylan Boga, DNP concurs with this decision.  Pt's nurse, Radene Ou, has been notified, and agrees to call report to [phone].  Pt is to be transported via [facility].  Jalene Mullet, [place] Triage Specialist [phone]

## 2016-09-16 NOTE — ED Notes (Signed)
Family at bedside. 

## 2016-09-16 NOTE — ED Notes (Signed)
Pt's spouse at bedside, pt calm and cooperative. Updated Spouse about possible pt's transfer .

## 2016-09-16 NOTE — ED Notes (Signed)
Gave pt Lunch tray

## 2016-09-16 NOTE — ED Notes (Signed)
[employer] called r/t transport to [facility] will be available after 1900-they will call 1/2 prior to pick up

## 2016-09-16 NOTE — ED Triage Notes (Signed)
Report called to Meg at [facility] will transport patient after 7pm today. Pt is up to bathroom without assist. Wife advised of plan.

## 2016-09-16 NOTE — ED Notes (Signed)
Pt out of room again stating "Where's Santiago Glad?"  Again, pt re-directed back to room and to bed.

## 2016-09-26 DIAGNOSIS — F410 Panic disorder [episodic paroxysmal anxiety] without agoraphobia: Secondary | ICD-10-CM | POA: Diagnosis not present

## 2016-09-26 DIAGNOSIS — F331 Major depressive disorder, recurrent, moderate: Secondary | ICD-10-CM | POA: Diagnosis not present

## 2016-10-04 DIAGNOSIS — H2513 Age-related nuclear cataract, bilateral: Secondary | ICD-10-CM | POA: Diagnosis not present

## 2016-10-09 DIAGNOSIS — Z13220 Encounter for screening for lipoid disorders: Secondary | ICD-10-CM | POA: Diagnosis not present

## 2016-10-09 DIAGNOSIS — F339 Major depressive disorder, recurrent, unspecified: Secondary | ICD-10-CM | POA: Diagnosis not present

## 2016-10-09 DIAGNOSIS — L089 Local infection of the skin and subcutaneous tissue, unspecified: Secondary | ICD-10-CM | POA: Diagnosis not present

## 2016-10-09 DIAGNOSIS — R7303 Prediabetes: Secondary | ICD-10-CM | POA: Diagnosis not present

## 2016-10-09 DIAGNOSIS — I10 Essential (primary) hypertension: Secondary | ICD-10-CM | POA: Diagnosis not present

## 2016-10-09 DIAGNOSIS — Z1211 Encounter for screening for malignant neoplasm of colon: Secondary | ICD-10-CM | POA: Diagnosis not present

## 2016-10-09 DIAGNOSIS — C61 Malignant neoplasm of prostate: Secondary | ICD-10-CM | POA: Diagnosis not present

## 2016-10-22 DIAGNOSIS — R195 Other fecal abnormalities: Secondary | ICD-10-CM | POA: Diagnosis not present

## 2016-11-19 DIAGNOSIS — D126 Benign neoplasm of colon, unspecified: Secondary | ICD-10-CM | POA: Diagnosis not present

## 2016-11-19 DIAGNOSIS — R195 Other fecal abnormalities: Secondary | ICD-10-CM | POA: Diagnosis not present

## 2016-11-19 DIAGNOSIS — K5730 Diverticulosis of large intestine without perforation or abscess without bleeding: Secondary | ICD-10-CM | POA: Diagnosis not present

## 2016-11-21 DIAGNOSIS — D126 Benign neoplasm of colon, unspecified: Secondary | ICD-10-CM | POA: Diagnosis not present

## 2016-11-28 DIAGNOSIS — F331 Major depressive disorder, recurrent, moderate: Secondary | ICD-10-CM | POA: Diagnosis not present

## 2016-11-28 DIAGNOSIS — F410 Panic disorder [episodic paroxysmal anxiety] without agoraphobia: Secondary | ICD-10-CM | POA: Diagnosis not present

## 2016-12-04 DIAGNOSIS — L905 Scar conditions and fibrosis of skin: Secondary | ICD-10-CM | POA: Diagnosis not present

## 2016-12-04 DIAGNOSIS — L821 Other seborrheic keratosis: Secondary | ICD-10-CM | POA: Diagnosis not present

## 2016-12-04 DIAGNOSIS — Z23 Encounter for immunization: Secondary | ICD-10-CM | POA: Diagnosis not present

## 2016-12-04 DIAGNOSIS — D225 Melanocytic nevi of trunk: Secondary | ICD-10-CM | POA: Diagnosis not present

## 2016-12-04 DIAGNOSIS — L570 Actinic keratosis: Secondary | ICD-10-CM | POA: Diagnosis not present

## 2016-12-04 DIAGNOSIS — L814 Other melanin hyperpigmentation: Secondary | ICD-10-CM | POA: Diagnosis not present

## 2016-12-04 DIAGNOSIS — D1800 Hemangioma unspecified site: Secondary | ICD-10-CM | POA: Diagnosis not present

## 2017-01-17 DIAGNOSIS — F410 Panic disorder [episodic paroxysmal anxiety] without agoraphobia: Secondary | ICD-10-CM | POA: Diagnosis not present

## 2017-01-17 DIAGNOSIS — F331 Major depressive disorder, recurrent, moderate: Secondary | ICD-10-CM | POA: Diagnosis not present

## 2017-01-27 DIAGNOSIS — C61 Malignant neoplasm of prostate: Secondary | ICD-10-CM | POA: Diagnosis not present

## 2017-01-29 DIAGNOSIS — C61 Malignant neoplasm of prostate: Secondary | ICD-10-CM | POA: Diagnosis not present

## 2017-03-12 DIAGNOSIS — F331 Major depressive disorder, recurrent, moderate: Secondary | ICD-10-CM | POA: Diagnosis not present

## 2017-03-12 DIAGNOSIS — F410 Panic disorder [episodic paroxysmal anxiety] without agoraphobia: Secondary | ICD-10-CM | POA: Diagnosis not present

## 2017-04-08 DIAGNOSIS — F410 Panic disorder [episodic paroxysmal anxiety] without agoraphobia: Secondary | ICD-10-CM | POA: Diagnosis not present

## 2017-04-08 DIAGNOSIS — F331 Major depressive disorder, recurrent, moderate: Secondary | ICD-10-CM | POA: Diagnosis not present

## 2017-05-13 DIAGNOSIS — F331 Major depressive disorder, recurrent, moderate: Secondary | ICD-10-CM | POA: Diagnosis not present

## 2017-05-13 DIAGNOSIS — F410 Panic disorder [episodic paroxysmal anxiety] without agoraphobia: Secondary | ICD-10-CM | POA: Diagnosis not present

## 2017-05-14 ENCOUNTER — Other Ambulatory Visit: Payer: Self-pay | Admitting: Urology

## 2017-05-14 DIAGNOSIS — C61 Malignant neoplasm of prostate: Secondary | ICD-10-CM

## 2017-05-30 ENCOUNTER — Encounter (HOSPITAL_COMMUNITY): Payer: Self-pay | Admitting: Emergency Medicine

## 2017-05-30 ENCOUNTER — Emergency Department (HOSPITAL_COMMUNITY): Payer: No Typology Code available for payment source

## 2017-05-30 ENCOUNTER — Emergency Department (HOSPITAL_COMMUNITY)
Admission: EM | Admit: 2017-05-30 | Discharge: 2017-05-30 | Disposition: A | Discharge status: Home / Self Care | Payer: No Typology Code available for payment source | Attending: Emergency Medicine | Admitting: Emergency Medicine

## 2017-05-30 DIAGNOSIS — S0101XA Laceration without foreign body of scalp, initial encounter: Secondary | ICD-10-CM | POA: Insufficient documentation

## 2017-05-30 DIAGNOSIS — S098XXA Other specified injuries of head, initial encounter: Secondary | ICD-10-CM | POA: Diagnosis present

## 2017-05-30 DIAGNOSIS — Y92410 Unspecified street and highway as the place of occurrence of the external cause: Secondary | ICD-10-CM | POA: Insufficient documentation

## 2017-05-30 DIAGNOSIS — S20211A Contusion of right front wall of thorax, initial encounter: Secondary | ICD-10-CM

## 2017-05-30 DIAGNOSIS — Z96653 Presence of artificial knee joint, bilateral: Secondary | ICD-10-CM | POA: Diagnosis not present

## 2017-05-30 DIAGNOSIS — Y999 Unspecified external cause status: Secondary | ICD-10-CM | POA: Insufficient documentation

## 2017-05-30 DIAGNOSIS — S299XXA Unspecified injury of thorax, initial encounter: Secondary | ICD-10-CM | POA: Diagnosis not present

## 2017-05-30 DIAGNOSIS — Z79899 Other long term (current) drug therapy: Secondary | ICD-10-CM | POA: Diagnosis not present

## 2017-05-30 DIAGNOSIS — Y9389 Activity, other specified: Secondary | ICD-10-CM | POA: Insufficient documentation

## 2017-05-30 DIAGNOSIS — R0781 Pleurodynia: Secondary | ICD-10-CM | POA: Diagnosis not present

## 2017-05-30 DIAGNOSIS — S069X9A Unspecified intracranial injury with loss of consciousness of unspecified duration, initial encounter: Secondary | ICD-10-CM | POA: Diagnosis not present

## 2017-05-30 MED ORDER — LIDOCAINE-EPINEPHRINE (PF) 2 %-1:200000 IJ SOLN
10.0000 mL | Freq: Once | INTRAMUSCULAR | Status: AC
  Administered 2017-05-30: 10 mL
  Filled 2017-05-30: qty 20

## 2017-05-30 MED ORDER — LIDOCAINE-EPINEPHRINE 1 %-1:100000 IJ SOLN: 10.0000 mL | Freq: Once | INTRAMUSCULAR | Status: DC

## 2017-05-30 NOTE — Discharge Instructions (Addendum)
Motrin or Tylenol for aches and pains. Staple removal in 7-10 days.  Primary care, or ER.
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
## 2017-06-09 DIAGNOSIS — Z4802 Encounter for removal of sutures: Secondary | ICD-10-CM | POA: Diagnosis not present

## 2017-06-09 DIAGNOSIS — S0990XA Unspecified injury of head, initial encounter: Secondary | ICD-10-CM | POA: Diagnosis not present

## 2017-06-09 DIAGNOSIS — R0789 Other chest pain: Secondary | ICD-10-CM | POA: Diagnosis not present

## 2017-06-13 ENCOUNTER — Ambulatory Visit
Admission: RE | Admit: 2017-06-13 | Discharge: 2017-06-13 | Disposition: A | Discharge status: Home / Self Care | Payer: Medicare Other | Source: Ambulatory Visit | Attending: Urology | Admitting: Urology

## 2017-06-13 DIAGNOSIS — C61 Malignant neoplasm of prostate: Secondary | ICD-10-CM | POA: Diagnosis not present

## 2017-06-13 MED ORDER — GADOBENATE DIMEGLUMINE 529 MG/ML IV SOLN
20.0000 mL | Freq: Once | INTRAVENOUS | Status: AC | PRN
  Administered 2017-06-13: 20 mL via INTRAVENOUS

## 2017-06-18 DIAGNOSIS — F410 Panic disorder [episodic paroxysmal anxiety] without agoraphobia: Secondary | ICD-10-CM | POA: Diagnosis not present

## 2017-06-18 DIAGNOSIS — F331 Major depressive disorder, recurrent, moderate: Secondary | ICD-10-CM | POA: Diagnosis not present

## 2017-07-18 DIAGNOSIS — C61 Malignant neoplasm of prostate: Secondary | ICD-10-CM | POA: Diagnosis not present

## 2017-07-30 ENCOUNTER — Encounter: Payer: Self-pay | Admitting: Radiation Oncology

## 2017-07-30 NOTE — Progress Notes (Signed)
GU Location of Tumor / Histology: prostatic adenocarcinoma  If Prostate Cancer, Gleason Score is (3 + 4) and PSA is (7.87). Prostate volume: 142 mL.  Skanda Worlds was diagnosed with prostate cancer 09/01/2015. He opted for active surveillance.  Biopsies of prostate (if applicable) revealed:   Past/Anticipated interventions by urology, if any: prostate biopsy, active surveillance, surveillance biopsy, referral for consideration of radiation therapy  Past/Anticipated interventions by medical oncology, if any: no  Weight changes, if any: no  Bowel/Bladder complaints, if FBP:ZWCHEN dysuria, hematuria, urinary leakage or incontinence.  Nausea/Vomiting, if any: no  Pain issues, if any:  Arthritic pain  SAFETY ISSUES:  Prior radiation? no  Pacemaker/ICD? no  Possible current pregnancy? no  Is the patient on methotrexate? no  Current Complaints / other details:  72 year old male. Married. Retired. Prostate cancer runs in family.

## 2017-07-31 ENCOUNTER — Encounter: Payer: Self-pay | Admitting: Radiation Oncology

## 2017-07-31 ENCOUNTER — Other Ambulatory Visit: Payer: Self-pay

## 2017-07-31 ENCOUNTER — Ambulatory Visit
Admission: RE | Admit: 2017-07-31 | Discharge: 2017-07-31 | Disposition: A | Discharge status: Home / Self Care | Payer: Medicare Other | Source: Ambulatory Visit | Attending: Radiation Oncology | Admitting: Radiation Oncology

## 2017-07-31 DIAGNOSIS — Z79899 Other long term (current) drug therapy: Secondary | ICD-10-CM | POA: Diagnosis not present

## 2017-07-31 DIAGNOSIS — C61 Malignant neoplasm of prostate: Secondary | ICD-10-CM

## 2017-07-31 DIAGNOSIS — Z8042 Family history of malignant neoplasm of prostate: Secondary | ICD-10-CM | POA: Insufficient documentation

## 2017-07-31 DIAGNOSIS — R972 Elevated prostate specific antigen [PSA]: Secondary | ICD-10-CM | POA: Diagnosis not present

## 2017-07-31 DIAGNOSIS — F3181 Bipolar II disorder: Secondary | ICD-10-CM | POA: Insufficient documentation

## 2017-07-31 DIAGNOSIS — Z882 Allergy status to sulfonamides status: Secondary | ICD-10-CM | POA: Insufficient documentation

## 2017-07-31 DIAGNOSIS — F419 Anxiety disorder, unspecified: Secondary | ICD-10-CM | POA: Diagnosis not present

## 2017-07-31 DIAGNOSIS — N401 Enlarged prostate with lower urinary tract symptoms: Secondary | ICD-10-CM | POA: Diagnosis not present

## 2017-07-31 HISTORY — DX: Malignant neoplasm of prostate: C61

## 2017-07-31 NOTE — Progress Notes (Signed)
Radiation Oncology         [phone] ________________________________  Initial Outpatient Consultation  Name: Calvin Santiago MRN: [id number]  Date: 07/31/2017  DOB: [birth year]  CC:London Pepper, MD  London Pepper, MD   REFERRING PHYSICIAN: London Pepper, MD  DIAGNOSIS: 72 y.o. gentleman with Stage T1c adenocarcinoma of the prostate with Gleason Score of 3+4, and PSA of 7.87.    ICD-10-CM   1. Malignant neoplasm of prostate ([place]) C61    HISTORY OF PRESENT ILLNESS: Calvin Santiago is a 72 y.o. male with a diagnosis of prostate cancer. The patient has a long-standing history of BPH with BOO.  He underwent a laser TUR procedure for BPH while living in [place] in January 2016. Since that time, he has had excellent improvement in BOOs, currently, IPSS is 1.  He moved back to [place] shortly thereafter and established urologic care with Dr. Diona Fanti on 09/01/15 with a PSA of 9.0 at that time. Digital rectal examination was performed at that time revealing no nodularity but a TRUSPBx on 09/01/15 demonstrated the prostate volume measured 109 cc and out of 12 core biopsies, two were positive with Gleason 3+3 disease in the left base and left mid prostate. The patient elected to undergo active surveillance and has remained diligent in follow-up with repeat PSAs since that time.  In November 2018, his PSA was noted at 7.87 and the recommendation was to proceed with further evaluation with prostate MRI and consideration for fusion biopsy pending those findings.   He underwent MRI of the prostate on 06/13/17 which showed a PI-RADS category 4 lesion in the right apical anterior peripheral zone. No worrisome lesions were identified. Prostate volume was 142.1 cubic cm at this time.   The patient proceeded with MRI fusion transrectal ultrasoumd biopsy on 07/18/17. Out of 12 core biopsies, two were positive. The maximum Gleason score was 3+4, and this was seen in the right apex lateral. Gleason 3+3 was  seen in left base and left medial region.  Prostate volume measured 171 gm.  Repeat DRE at this time showed no nodularity and a repeat PSA on 07/18/17 was 7.87.   The patient reviewed the biopsy results with his urologist and he has kindly been referred today for discussion of potential radiation treatment options.   PREVIOUS RADIATION THERAPY: No  PAST MEDICAL HISTORY:  Past Medical History:  Diagnosis Date  . Anxiety   . Bipolar 2 disorder ([place])   . Prostate cancer ([place])       PAST SURGICAL HISTORY: Past Surgical History:  Procedure Laterality Date  . PROSTATE BIOPSY    . REPLACEMENT TOTAL KNEE     bilateral    FAMILY HISTORY:  Family History  Problem Relation Age of Onset  . Prostate cancer Father   . Prostate cancer Brother   . Prostate cancer Maternal Grandfather   . Breast cancer Neg Hx   . Pancreatic cancer Neg Hx   . Colon cancer Neg Hx     SOCIAL HISTORY:  Social History   Socioeconomic History  . Marital status: Married    Spouse name: Not on file  . Number of children: 1  . Years of education: Not on file  . Highest education level: Not on file  Occupational History  . Not on file  Social Needs  . Financial resource strain: Not on file  . Food insecurity:    Worry: Not on file    Inability: Not on file  . Transportation needs:  Medical: Not on file    Non-medical: Not on file  Tobacco Use  . Smoking status: Never Smoker  . Smokeless tobacco: Never Used  Substance and Sexual Activity  . Alcohol use: Never    Frequency: Never  . Drug use: Never  . Sexual activity: Not Currently  Lifestyle  . Physical activity:    Days per week: Not on file    Minutes per session: Not on file  . Stress: Not on file  Relationships  . Social connections:    Talks on phone: Not on file    Gets together: Not on file    Attends religious service: Not on file    Active member of club or organization: Not on file    Attends meetings of clubs or organizations:  Not on file    Relationship status: Not on file  . Intimate partner violence:    Fear of current or ex partner: Not on file    Emotionally abused: Not on file    Physically abused: Not on file    Forced sexual activity: Not on file  Other Topics Concern  . Not on file  Social History Narrative  . Not on file    ALLERGIES: Sulfa antibiotics  MEDICATIONS:  Current Outpatient Medications  Medication Sig Dispense Refill  . acetaminophen (TYLENOL) 500 MG tablet Take 1,000 mg by mouth every 6 (six) hours as needed for moderate pain.    Marland Kitchen amLODipine (NORVASC) 10 MG tablet Take 5 mg by mouth daily.    . clonazePAM (KLONOPIN) 0.5 MG tablet     . gabapentin (NEURONTIN) 300 MG capsule     . sertraline (ZOLOFT) 100 MG tablet Take 150 mg by mouth daily.     . TRAZODONE HCL PO Take by mouth.    . diazepam (VALIUM) 10 MG tablet TK WITH YOU TO MRI APPOINTMENT  0   No current facility-administered medications for this encounter.     REVIEW OF SYSTEMS:  On review of systems, the patient reports that he is doing well overall. He denies any chest pain, shortness of breath, cough, fevers, chills, night sweats, unintended weight changes. He denies any bowel disturbances, and denies abdominal pain, nausea or vomiting. He denies any new musculoskeletal or joint aches or pains. His IPSS was 1, indicating mild urinary symptoms. He denies dysuria, hematuria, urinary leakage, and incontinence. He is not able to complete sexual activity with most attempts. A complete review of systems is obtained and is otherwise negative.    PHYSICAL EXAM:  Wt Readings from Last 3 Encounters:  07/31/17 262 lb 6.4 oz (119 kg)  09/09/16 275 lb (124.7 kg)   Temp Readings from Last 3 Encounters:  07/31/17 98.7 F (37.1 C) (Oral)  05/30/17 98.4 F (36.9 C) (Oral)  09/16/16 (!) 97.5 F (36.4 C) (Oral)   BP Readings from Last 3 Encounters:  07/31/17 117/83  05/30/17 133/72  09/16/16 (!) 153/71   Pulse Readings from  Last 3 Encounters:  07/31/17 89  05/30/17 74  09/16/16 87   Pain Assessment Pain Score: 0-No pain/10  In general this is a well appearing caucasian male in no acute distress. He is alert and oriented x4 and appropriate throughout the examination. HEENT reveals that the patient is normocephalic, atraumatic. EOMs are intact. PERRLA. Skin is intact without any evidence of gross lesions. Cardiovascular exam reveals a regular rate and rhythm, no clicks rubs or murmurs are auscultated. Chest is clear to auscultation bilaterally. Lymphatic assessment is performed  and does not reveal any adenopathy in the cervical, supraclavicular, axillary, or inguinal chains. Abdomen has active bowel sounds in all quadrants and is intact. The abdomen is soft, non tender, non distended. Lower extremities are negative for pretibial pitting edema, deep calf tenderness, cyanosis or clubbing.   KPS = 100  100 - Normal; no complaints; no evidence of disease. 90   - Able to carry on normal activity; minor signs or symptoms of disease. 80   - Normal activity with effort; some signs or symptoms of disease. 66   - Cares for self; unable to carry on normal activity or to do active work. 60   - Requires occasional assistance, but is able to care for most of his personal needs. 50   - Requires considerable assistance and frequent medical care. 70   - Disabled; requires special care and assistance. 63   - Severely disabled; hospital admission is indicated although death not imminent. 50   - Very sick; hospital admission necessary; active supportive treatment necessary. 10   - Moribund; fatal processes progressing rapidly. 0     - Dead  Karnofsky DA, Abelmann Hulett, Craver LS and Burchenal [facility] [phone]) The use of the nitrogen mustards in the palliative treatment of carcinoma: with particular reference to bronchogenic carcinoma Cancer 1 634-56  LABORATORY DATA:  Lab Results  Component Value Date   WBC 9.1 09/13/2016   HGB 14.8  09/13/2016   HCT 42.7 09/13/2016   MCV 83.2 09/13/2016   PLT 264 09/13/2016   Lab Results  Component Value Date   NA 139 09/13/2016   K 5.1 09/13/2016   CL 105 09/13/2016   CO2 23 09/13/2016   Lab Results  Component Value Date   ALT 43 09/13/2016   AST 49 (H) 09/13/2016   ALKPHOS 56 09/13/2016   BILITOT 1.7 (H) 09/13/2016     RADIOGRAPHY: No results found.    IMPRESSION/PLAN: 1. 72 y.o. gentleman with Stage T1c adenocarcinoma of the prostate with Gleason Score of 3+4, and PSA of 7.87.  We discussed the patient's workup and outlined the nature of prostate cancer in this setting. The patient's T stage, Gleason's score, and PSA put him into the favorable intermediate risk group. Accordingly, he is eligible for a variety of potential treatment options including 5.5 weeks of daily external radiation or brachytherapy. We discussed the available radiation techniques, and focused on the details of logistics and delivery. The patient is not a candidate for brachytherapy with a prostate volume of 171 cc and history of prior TURP and is not felt to be a candidate for prostatectomy given his age.  Therefore, we discussed and outlined the risks, benefits, short and long-term effects associated with external beam radiotherapy. We also discussed the role of SpaceOAR in reducing the rectal toxicity associated with radiotherapy.  At the conclusion of our conversation today, the patient elects to proceed with 5.5 weeks of daily prostate IMRT.  Given the large size of his prostate, we have recommended the use of SpaceOAR gel to protect the rectum from the toxicities associated with radiotherapy and he is in agreement. We will share our findings with Dr. Diona Fanti and move forward with scheduling a follow up visit for placement of three gold fiducial markers into the prostate along with placement of SpaceOAR gel in preparation for proceeding with IMRT in the near future.     We enjoyed meeting with him and  his wife today and will look forward to participating in the care  of this very nice gentleman.    Nicholos Johns, PA-C    Tyler Pita, MD  [facility] Direct Dial: [phone]  Fax: [phone] .com  Skype  [email or web address]  This document serves as a record of services personally performed by Tyler Pita, MD and Freeman Caldron, PA-C. It was created on their behalf by Bethann Humble, a trained medical scribe. The creation of this record is based on the scribe's personal observations and the provider's statements to them. This document has been checked and approved by the attending provider.

## 2017-07-31 NOTE — Progress Notes (Signed)
See progress note under physician encounter. 

## 2017-08-01 ENCOUNTER — Telehealth: Payer: Self-pay | Admitting: Medical Oncology

## 2017-08-01 NOTE — Telephone Encounter (Signed)
Called patient and spoke with his sister Santiago Glad to introduce myself as the prostate nurse navigator and my role. I was unable to meet him and his family when he consulted with Dr. Tammi Klippel 07/31/17. She states the visit went very well. The only concern is they have not heard from Dr. Alan Ripper office about an appointment for the fiducial markers and SpaceOAR. I explained that Enid Derry who schedules these appointments is out of the office today and it will take a few days to coordinate. She voiced understanding and I asked her to call me back with questions or concerns. I look forward to meeting them.

## 2017-08-13 DIAGNOSIS — F410 Panic disorder [episodic paroxysmal anxiety] without agoraphobia: Secondary | ICD-10-CM | POA: Diagnosis not present

## 2017-08-13 DIAGNOSIS — F331 Major depressive disorder, recurrent, moderate: Secondary | ICD-10-CM | POA: Diagnosis not present

## 2017-09-16 ENCOUNTER — Telehealth: Payer: Self-pay | Admitting: *Deleted

## 2017-09-16 ENCOUNTER — Other Ambulatory Visit: Payer: Self-pay | Admitting: Urology

## 2017-09-16 DIAGNOSIS — C61 Malignant neoplasm of prostate: Secondary | ICD-10-CM

## 2017-09-16 NOTE — Telephone Encounter (Signed)
Called patient to inform of fid. Marker and space oar for 09-29-17 @ [facility] and his sim on August 2 @ 10 am @ [facility]- Dr. Johny Shears Office, spoke with patient's wife- Santiago Glad and she is aware of these appts.

## 2017-09-29 DIAGNOSIS — C61 Malignant neoplasm of prostate: Secondary | ICD-10-CM | POA: Diagnosis not present

## 2017-09-30 ENCOUNTER — Telehealth: Payer: Self-pay | Admitting: *Deleted

## 2017-09-30 NOTE — Telephone Encounter (Signed)
CALLED PATIENT TO INFORM OF SIM AND MRI FOR 10-03-17, SPOKE WITH PATIENT'S WIFE- KAREN AND SHE IS AWARE OF THESE APPTS.

## 2017-10-02 ENCOUNTER — Telehealth: Payer: Self-pay | Admitting: *Deleted

## 2017-10-02 NOTE — Telephone Encounter (Signed)
CALLED PATIENT TO REMIND OF SIM AND MRI FOR 10-03-17, SPOKE WITH PATIENT'S WIFE - KAREN AND SHE IS AWARE OF THESE APPTS.

## 2017-10-03 ENCOUNTER — Ambulatory Visit (HOSPITAL_COMMUNITY)
Admission: RE | Admit: 2017-10-03 | Discharge: 2017-10-03 | Disposition: A | Discharge status: Home / Self Care | Payer: Medicare Other | Source: Ambulatory Visit | Attending: Urology | Admitting: Urology

## 2017-10-03 ENCOUNTER — Encounter: Payer: Self-pay | Admitting: Medical Oncology

## 2017-10-03 ENCOUNTER — Ambulatory Visit
Admission: RE | Admit: 2017-10-03 | Discharge: 2017-10-03 | Disposition: A | Discharge status: Home / Self Care | Payer: Medicare Other | Source: Ambulatory Visit | Attending: Radiation Oncology | Admitting: Radiation Oncology

## 2017-10-03 DIAGNOSIS — C61 Malignant neoplasm of prostate: Secondary | ICD-10-CM | POA: Diagnosis not present

## 2017-10-03 DIAGNOSIS — Z510 Encounter for antineoplastic radiation therapy: Secondary | ICD-10-CM | POA: Insufficient documentation

## 2017-10-03 NOTE — Progress Notes (Signed)
Met patient and his wife during CT simulation. I was unable to meet them during their initial consult but did speak to them by phone. He states he has a strong family history of prostate cancer and is ready to get treatment. He is scheduled to start 8/13. I gave them my business card and asked them to call me with questions or concerns.  

## 2017-10-03 NOTE — Progress Notes (Signed)
  Radiation Oncology         [phone] ________________________________  Name: Calvin Santiago MRN: [id number]  Date: 10/03/2017  DOB: May 16, 1945  SIMULATION AND TREATMENT PLANNING NOTE    ICD-10-CM   1. Malignant neoplasm of prostate ([place]) C61     DIAGNOSIS:  72 y.o. gentleman with stage T1c adenocarcinoma of the prostate with Gleason Score of 3+4, PSA of 7.87.  NARRATIVE:  The patient was brought to the [facility].  Identity was confirmed.  All relevant records and images related to the planned course of therapy were reviewed.  The patient freely provided informed written consent to proceed with treatment after reviewing the details related to the planned course of therapy. The consent form was witnessed and verified by the simulation staff.  Then, the patient was set-up in a stable reproducible supine position for radiation therapy.  A vacuum lock pillow device was custom fabricated to position his legs in a reproducible immobilized position.  Then, I performed a urethrogram under sterile conditions to identify the prostatic apex.  CT images were obtained.  Surface markings were placed.  The CT images were loaded into the planning software.  Then the prostate target and avoidance structures including the rectum, bladder, bowel and hips were contoured.  Treatment planning then occurred.  The radiation prescription was entered and confirmed.  A total of one complex treatment devices was fabricated. I have requested : Intensity Modulated Radiotherapy (IMRT) is medically necessary for this case for the following reason:  Rectal sparing.Marland Kitchen  PLAN:  The patient will receive 70 Gy in 28 fractions.  ________________________________  Sheral Apley Tammi Klippel, M.D.  This document serves as a record of services personally performed by Tyler Pita MD. It was created on his behalf by Delton Coombes, a trained medical scribe. The creation of this record is based on the scribe's personal  observations and the provider's statements to them.

## 2017-10-08 DIAGNOSIS — Z510 Encounter for antineoplastic radiation therapy: Secondary | ICD-10-CM | POA: Diagnosis not present

## 2017-10-08 DIAGNOSIS — C61 Malignant neoplasm of prostate: Secondary | ICD-10-CM | POA: Diagnosis not present

## 2017-10-14 ENCOUNTER — Encounter: Payer: Self-pay | Admitting: Medical Oncology

## 2017-10-14 ENCOUNTER — Ambulatory Visit
Admission: RE | Admit: 2017-10-14 | Discharge: 2017-10-14 | Disposition: A | Discharge status: Home / Self Care | Payer: Medicare Other | Source: Ambulatory Visit | Attending: Radiation Oncology | Admitting: Radiation Oncology

## 2017-10-14 DIAGNOSIS — C61 Malignant neoplasm of prostate: Secondary | ICD-10-CM | POA: Diagnosis not present

## 2017-10-14 DIAGNOSIS — Z510 Encounter for antineoplastic radiation therapy: Secondary | ICD-10-CM | POA: Diagnosis not present

## 2017-10-15 ENCOUNTER — Ambulatory Visit
Admission: RE | Admit: 2017-10-15 | Discharge: 2017-10-15 | Disposition: A | Discharge status: Home / Self Care | Payer: Medicare Other | Source: Ambulatory Visit | Attending: Radiation Oncology | Admitting: Radiation Oncology

## 2017-10-15 DIAGNOSIS — Z510 Encounter for antineoplastic radiation therapy: Secondary | ICD-10-CM | POA: Diagnosis not present

## 2017-10-15 DIAGNOSIS — C61 Malignant neoplasm of prostate: Secondary | ICD-10-CM | POA: Diagnosis not present

## 2017-10-16 ENCOUNTER — Ambulatory Visit
Admission: RE | Admit: 2017-10-16 | Discharge: 2017-10-16 | Disposition: A | Discharge status: Home / Self Care | Payer: Medicare Other | Source: Ambulatory Visit | Attending: Radiation Oncology | Admitting: Radiation Oncology

## 2017-10-16 DIAGNOSIS — Z510 Encounter for antineoplastic radiation therapy: Secondary | ICD-10-CM | POA: Diagnosis not present

## 2017-10-16 DIAGNOSIS — C61 Malignant neoplasm of prostate: Secondary | ICD-10-CM | POA: Diagnosis not present

## 2017-10-17 ENCOUNTER — Ambulatory Visit
Admission: RE | Admit: 2017-10-17 | Discharge: 2017-10-17 | Disposition: A | Discharge status: Home / Self Care | Payer: Medicare Other | Source: Ambulatory Visit | Attending: Radiation Oncology | Admitting: Radiation Oncology

## 2017-10-17 DIAGNOSIS — C61 Malignant neoplasm of prostate: Secondary | ICD-10-CM | POA: Diagnosis not present

## 2017-10-17 DIAGNOSIS — Z510 Encounter for antineoplastic radiation therapy: Secondary | ICD-10-CM | POA: Diagnosis not present

## 2017-10-19 ENCOUNTER — Ambulatory Visit: Admission: RE | Admit: 2017-10-19 | Payer: Medicare Other | Source: Ambulatory Visit

## 2017-10-20 ENCOUNTER — Ambulatory Visit
Admission: RE | Admit: 2017-10-20 | Discharge: 2017-10-20 | Disposition: A | Discharge status: Home / Self Care | Payer: Medicare Other | Source: Ambulatory Visit | Attending: Radiation Oncology | Admitting: Radiation Oncology

## 2017-10-20 DIAGNOSIS — C61 Malignant neoplasm of prostate: Secondary | ICD-10-CM | POA: Diagnosis not present

## 2017-10-20 DIAGNOSIS — Z510 Encounter for antineoplastic radiation therapy: Secondary | ICD-10-CM | POA: Diagnosis not present

## 2017-10-21 ENCOUNTER — Ambulatory Visit
Admission: RE | Admit: 2017-10-21 | Discharge: 2017-10-21 | Disposition: A | Discharge status: Home / Self Care | Payer: Medicare Other | Source: Ambulatory Visit | Attending: Radiation Oncology | Admitting: Radiation Oncology

## 2017-10-21 DIAGNOSIS — Z510 Encounter for antineoplastic radiation therapy: Secondary | ICD-10-CM | POA: Diagnosis not present

## 2017-10-21 DIAGNOSIS — C61 Malignant neoplasm of prostate: Secondary | ICD-10-CM | POA: Diagnosis not present

## 2017-10-22 ENCOUNTER — Ambulatory Visit
Admission: RE | Admit: 2017-10-22 | Discharge: 2017-10-22 | Disposition: A | Discharge status: Home / Self Care | Payer: Medicare Other | Source: Ambulatory Visit | Attending: Radiation Oncology | Admitting: Radiation Oncology

## 2017-10-22 DIAGNOSIS — Z510 Encounter for antineoplastic radiation therapy: Secondary | ICD-10-CM | POA: Diagnosis not present

## 2017-10-22 DIAGNOSIS — C61 Malignant neoplasm of prostate: Secondary | ICD-10-CM | POA: Diagnosis not present

## 2017-10-23 ENCOUNTER — Ambulatory Visit
Admission: RE | Admit: 2017-10-23 | Discharge: 2017-10-23 | Disposition: A | Discharge status: Home / Self Care | Payer: Medicare Other | Source: Ambulatory Visit | Attending: Radiation Oncology | Admitting: Radiation Oncology

## 2017-10-23 DIAGNOSIS — C61 Malignant neoplasm of prostate: Secondary | ICD-10-CM | POA: Diagnosis not present

## 2017-10-23 DIAGNOSIS — Z510 Encounter for antineoplastic radiation therapy: Secondary | ICD-10-CM | POA: Diagnosis not present

## 2017-10-24 ENCOUNTER — Ambulatory Visit
Admission: RE | Admit: 2017-10-24 | Discharge: 2017-10-24 | Disposition: A | Discharge status: Home / Self Care | Payer: Medicare Other | Source: Ambulatory Visit | Attending: Radiation Oncology | Admitting: Radiation Oncology

## 2017-10-24 DIAGNOSIS — C61 Malignant neoplasm of prostate: Secondary | ICD-10-CM | POA: Diagnosis not present

## 2017-10-24 DIAGNOSIS — Z510 Encounter for antineoplastic radiation therapy: Secondary | ICD-10-CM | POA: Diagnosis not present

## 2017-10-27 ENCOUNTER — Ambulatory Visit
Admission: RE | Admit: 2017-10-27 | Discharge: 2017-10-27 | Disposition: A | Discharge status: Home / Self Care | Payer: Medicare Other | Source: Ambulatory Visit | Attending: Radiation Oncology | Admitting: Radiation Oncology

## 2017-10-27 DIAGNOSIS — Z510 Encounter for antineoplastic radiation therapy: Secondary | ICD-10-CM | POA: Diagnosis not present

## 2017-10-27 DIAGNOSIS — C61 Malignant neoplasm of prostate: Secondary | ICD-10-CM | POA: Diagnosis not present

## 2017-10-28 ENCOUNTER — Ambulatory Visit
Admission: RE | Admit: 2017-10-28 | Discharge: 2017-10-28 | Disposition: A | Discharge status: Home / Self Care | Payer: Medicare Other | Source: Ambulatory Visit | Attending: Radiation Oncology | Admitting: Radiation Oncology

## 2017-10-28 DIAGNOSIS — Z510 Encounter for antineoplastic radiation therapy: Secondary | ICD-10-CM | POA: Diagnosis not present

## 2017-10-28 DIAGNOSIS — C61 Malignant neoplasm of prostate: Secondary | ICD-10-CM | POA: Diagnosis not present

## 2017-10-29 ENCOUNTER — Ambulatory Visit
Admission: RE | Admit: 2017-10-29 | Discharge: 2017-10-29 | Disposition: A | Discharge status: Home / Self Care | Payer: Medicare Other | Source: Ambulatory Visit | Attending: Radiation Oncology | Admitting: Radiation Oncology

## 2017-10-29 DIAGNOSIS — Z510 Encounter for antineoplastic radiation therapy: Secondary | ICD-10-CM | POA: Diagnosis not present

## 2017-10-29 DIAGNOSIS — C61 Malignant neoplasm of prostate: Secondary | ICD-10-CM | POA: Diagnosis not present

## 2017-10-30 ENCOUNTER — Ambulatory Visit
Admission: RE | Admit: 2017-10-30 | Discharge: 2017-10-30 | Disposition: A | Discharge status: Home / Self Care | Payer: Medicare Other | Source: Ambulatory Visit | Attending: Radiation Oncology | Admitting: Radiation Oncology

## 2017-10-30 DIAGNOSIS — Z510 Encounter for antineoplastic radiation therapy: Secondary | ICD-10-CM | POA: Diagnosis not present

## 2017-10-30 DIAGNOSIS — C61 Malignant neoplasm of prostate: Secondary | ICD-10-CM | POA: Diagnosis not present

## 2017-10-31 ENCOUNTER — Ambulatory Visit
Admission: RE | Admit: 2017-10-31 | Discharge: 2017-10-31 | Disposition: A | Discharge status: Home / Self Care | Payer: Medicare Other | Source: Ambulatory Visit | Attending: Radiation Oncology | Admitting: Radiation Oncology

## 2017-10-31 DIAGNOSIS — C61 Malignant neoplasm of prostate: Secondary | ICD-10-CM | POA: Diagnosis not present

## 2017-10-31 DIAGNOSIS — Z510 Encounter for antineoplastic radiation therapy: Secondary | ICD-10-CM | POA: Diagnosis not present

## 2017-11-04 ENCOUNTER — Ambulatory Visit
Admission: RE | Admit: 2017-11-04 | Discharge: 2017-11-04 | Disposition: A | Discharge status: Home / Self Care | Payer: Medicare Other | Source: Ambulatory Visit | Attending: Radiation Oncology | Admitting: Radiation Oncology

## 2017-11-04 DIAGNOSIS — C61 Malignant neoplasm of prostate: Secondary | ICD-10-CM | POA: Insufficient documentation

## 2017-11-04 DIAGNOSIS — Z510 Encounter for antineoplastic radiation therapy: Secondary | ICD-10-CM | POA: Diagnosis not present

## 2017-11-05 ENCOUNTER — Ambulatory Visit
Admission: RE | Admit: 2017-11-05 | Discharge: 2017-11-05 | Disposition: A | Discharge status: Home / Self Care | Payer: Medicare Other | Source: Ambulatory Visit | Attending: Radiation Oncology | Admitting: Radiation Oncology

## 2017-11-05 DIAGNOSIS — C61 Malignant neoplasm of prostate: Secondary | ICD-10-CM | POA: Diagnosis not present

## 2017-11-05 DIAGNOSIS — Z510 Encounter for antineoplastic radiation therapy: Secondary | ICD-10-CM | POA: Diagnosis not present

## 2017-11-06 ENCOUNTER — Ambulatory Visit
Admission: RE | Admit: 2017-11-06 | Discharge: 2017-11-06 | Disposition: A | Discharge status: Home / Self Care | Payer: Medicare Other | Source: Ambulatory Visit | Attending: Radiation Oncology | Admitting: Radiation Oncology

## 2017-11-06 DIAGNOSIS — C61 Malignant neoplasm of prostate: Secondary | ICD-10-CM | POA: Diagnosis not present

## 2017-11-06 DIAGNOSIS — Z510 Encounter for antineoplastic radiation therapy: Secondary | ICD-10-CM | POA: Diagnosis not present

## 2017-11-07 ENCOUNTER — Ambulatory Visit
Admission: RE | Admit: 2017-11-07 | Discharge: 2017-11-07 | Disposition: A | Discharge status: Home / Self Care | Payer: Medicare Other | Source: Ambulatory Visit | Attending: Radiation Oncology | Admitting: Radiation Oncology

## 2017-11-07 DIAGNOSIS — Z510 Encounter for antineoplastic radiation therapy: Secondary | ICD-10-CM | POA: Diagnosis not present

## 2017-11-07 DIAGNOSIS — C61 Malignant neoplasm of prostate: Secondary | ICD-10-CM | POA: Diagnosis not present

## 2017-11-10 ENCOUNTER — Ambulatory Visit
Admission: RE | Admit: 2017-11-10 | Discharge: 2017-11-10 | Disposition: A | Discharge status: Home / Self Care | Payer: Medicare Other | Source: Ambulatory Visit | Attending: Radiation Oncology | Admitting: Radiation Oncology

## 2017-11-10 ENCOUNTER — Encounter: Payer: Self-pay | Admitting: Medical Oncology

## 2017-11-10 DIAGNOSIS — Z510 Encounter for antineoplastic radiation therapy: Secondary | ICD-10-CM | POA: Diagnosis not present

## 2017-11-10 DIAGNOSIS — C61 Malignant neoplasm of prostate: Secondary | ICD-10-CM | POA: Diagnosis not present

## 2017-11-11 ENCOUNTER — Ambulatory Visit
Admission: RE | Admit: 2017-11-11 | Discharge: 2017-11-11 | Disposition: A | Discharge status: Home / Self Care | Payer: Medicare Other | Source: Ambulatory Visit | Attending: Radiation Oncology | Admitting: Radiation Oncology

## 2017-11-11 DIAGNOSIS — C61 Malignant neoplasm of prostate: Secondary | ICD-10-CM | POA: Diagnosis not present

## 2017-11-11 DIAGNOSIS — F410 Panic disorder [episodic paroxysmal anxiety] without agoraphobia: Secondary | ICD-10-CM | POA: Diagnosis not present

## 2017-11-11 DIAGNOSIS — F331 Major depressive disorder, recurrent, moderate: Secondary | ICD-10-CM | POA: Diagnosis not present

## 2017-11-11 DIAGNOSIS — Z510 Encounter for antineoplastic radiation therapy: Secondary | ICD-10-CM | POA: Diagnosis not present

## 2017-11-12 ENCOUNTER — Ambulatory Visit
Admission: RE | Admit: 2017-11-12 | Discharge: 2017-11-12 | Disposition: A | Discharge status: Home / Self Care | Payer: Medicare Other | Source: Ambulatory Visit | Attending: Radiation Oncology | Admitting: Radiation Oncology

## 2017-11-12 DIAGNOSIS — C61 Malignant neoplasm of prostate: Secondary | ICD-10-CM | POA: Diagnosis not present

## 2017-11-12 DIAGNOSIS — Z510 Encounter for antineoplastic radiation therapy: Secondary | ICD-10-CM | POA: Diagnosis not present

## 2017-11-13 ENCOUNTER — Ambulatory Visit
Admission: RE | Admit: 2017-11-13 | Discharge: 2017-11-13 | Disposition: A | Discharge status: Home / Self Care | Payer: Medicare Other | Source: Ambulatory Visit | Attending: Radiation Oncology | Admitting: Radiation Oncology

## 2017-11-13 DIAGNOSIS — C61 Malignant neoplasm of prostate: Secondary | ICD-10-CM | POA: Diagnosis not present

## 2017-11-13 DIAGNOSIS — Z510 Encounter for antineoplastic radiation therapy: Secondary | ICD-10-CM | POA: Diagnosis not present

## 2017-11-14 ENCOUNTER — Ambulatory Visit
Admission: RE | Admit: 2017-11-14 | Discharge: 2017-11-14 | Disposition: A | Discharge status: Home / Self Care | Payer: Medicare Other | Source: Ambulatory Visit | Attending: Radiation Oncology | Admitting: Radiation Oncology

## 2017-11-14 DIAGNOSIS — Z510 Encounter for antineoplastic radiation therapy: Secondary | ICD-10-CM | POA: Diagnosis not present

## 2017-11-14 DIAGNOSIS — C61 Malignant neoplasm of prostate: Secondary | ICD-10-CM | POA: Diagnosis not present

## 2017-11-17 ENCOUNTER — Ambulatory Visit
Admission: RE | Admit: 2017-11-17 | Discharge: 2017-11-17 | Disposition: A | Discharge status: Home / Self Care | Payer: Medicare Other | Source: Ambulatory Visit | Attending: Radiation Oncology | Admitting: Radiation Oncology

## 2017-11-17 DIAGNOSIS — C61 Malignant neoplasm of prostate: Secondary | ICD-10-CM | POA: Diagnosis not present

## 2017-11-17 DIAGNOSIS — Z510 Encounter for antineoplastic radiation therapy: Secondary | ICD-10-CM | POA: Diagnosis not present

## 2017-11-18 ENCOUNTER — Ambulatory Visit
Admission: RE | Admit: 2017-11-18 | Discharge: 2017-11-18 | Disposition: A | Discharge status: Home / Self Care | Payer: Medicare Other | Source: Ambulatory Visit | Attending: Radiation Oncology | Admitting: Radiation Oncology

## 2017-11-18 DIAGNOSIS — Z510 Encounter for antineoplastic radiation therapy: Secondary | ICD-10-CM | POA: Diagnosis not present

## 2017-11-18 DIAGNOSIS — C61 Malignant neoplasm of prostate: Secondary | ICD-10-CM | POA: Diagnosis not present

## 2017-11-19 ENCOUNTER — Ambulatory Visit
Admission: RE | Admit: 2017-11-19 | Discharge: 2017-11-19 | Disposition: A | Discharge status: Home / Self Care | Payer: Medicare Other | Source: Ambulatory Visit | Attending: Radiation Oncology | Admitting: Radiation Oncology

## 2017-11-19 DIAGNOSIS — Z510 Encounter for antineoplastic radiation therapy: Secondary | ICD-10-CM | POA: Diagnosis not present

## 2017-11-19 DIAGNOSIS — C61 Malignant neoplasm of prostate: Secondary | ICD-10-CM | POA: Diagnosis not present

## 2017-11-20 ENCOUNTER — Ambulatory Visit
Admission: RE | Admit: 2017-11-20 | Discharge: 2017-11-20 | Disposition: A | Discharge status: Home / Self Care | Payer: Medicare Other | Source: Ambulatory Visit | Attending: Radiation Oncology | Admitting: Radiation Oncology

## 2017-11-20 DIAGNOSIS — C61 Malignant neoplasm of prostate: Secondary | ICD-10-CM | POA: Diagnosis not present

## 2017-11-20 DIAGNOSIS — Z510 Encounter for antineoplastic radiation therapy: Secondary | ICD-10-CM | POA: Diagnosis not present

## 2017-11-21 ENCOUNTER — Ambulatory Visit
Admission: RE | Admit: 2017-11-21 | Discharge: 2017-11-21 | Disposition: A | Discharge status: Home / Self Care | Payer: Medicare Other | Source: Ambulatory Visit | Attending: Radiation Oncology | Admitting: Radiation Oncology

## 2017-11-21 DIAGNOSIS — Z510 Encounter for antineoplastic radiation therapy: Secondary | ICD-10-CM | POA: Diagnosis not present

## 2017-11-21 DIAGNOSIS — C61 Malignant neoplasm of prostate: Secondary | ICD-10-CM | POA: Diagnosis not present

## 2017-11-24 ENCOUNTER — Ambulatory Visit: Payer: Medicare Other

## 2017-11-25 ENCOUNTER — Ambulatory Visit: Payer: Medicare Other

## 2017-11-26 ENCOUNTER — Ambulatory Visit: Payer: Medicare Other

## 2017-11-27 ENCOUNTER — Ambulatory Visit: Payer: Medicare Other

## 2017-11-28 ENCOUNTER — Ambulatory Visit: Payer: Medicare Other

## 2017-12-01 ENCOUNTER — Ambulatory Visit: Payer: Medicare Other

## 2017-12-01 ENCOUNTER — Encounter: Payer: Self-pay | Admitting: Radiation Oncology

## 2017-12-01 NOTE — Progress Notes (Signed)
  Radiation Oncology         [phone]) [phone] ________________________________  Name: Calvin Santiago MRN: [id number]  Date: 12/01/2017  DOB: 02/02/1946  End of Treatment Note  Diagnosis:   72 y.o. gentleman with stage T1c adenocarcinoma of the prostate with Gleason Score of 3+4, PSA of 7.87     Indication for treatment:  Curative, Definitive Radiotherapy       Radiation treatment dates:   10/14/17 - 11/21/17  Site/dose:   The prostate was treated to 70 Gy in 25 fractions of 2.5 Gy  Beams/energy:   The patient was treated with IMRT using volumetric arc therapy delivering 6 MV X-rays to clockwise and counterclockwise circumferential arcs with a 90 degree collimator offset to avoid dose scalloping.  Image guidance was performed with daily cone beam CT prior to each fraction to align to gold markers in the prostate and assure proper bladder and rectal fill volumes.  Immobilization was achieved with BodyFix custom mold.  Narrative: The patient tolerated radiation treatment relatively well.  He experienced some minor urinary irritation and modest fatigue with nocturia that resolved by the end of treatment as well as straining to urinate, urgency, frequency, and increasing fatigue and dysuria.  He denied hematuria throughout treatment.  His pain and fatigue were noted to be the worst during his 18th fraction of treatment. He denied loose stools throughout, but towards the end of treatment, he reported rectal burning and diarrhea x6-8 per day lasting 2 days.  By the end of treatment, he reported burning with urination and dribbling urine during the day.   Plan: The patient has completed radiation treatment. He will return to radiation oncology clinic for routine followup in one month. I advised him to call or return sooner if he has any questions or concerns related to his recovery or treatment. ________________________________  Sheral Apley. Tammi Klippel, M.D.  This document serves as a record of services personally  performed by Tyler Pita, MD. It was created on his behalf by Wilburn Mylar, a trained medical scribe. The creation of this record is based on the scribe's personal observations and the provider's statements to them. This document has been checked and approved by the attending provider.

## 2017-12-02 ENCOUNTER — Ambulatory Visit: Payer: Medicare Other

## 2017-12-03 ENCOUNTER — Ambulatory Visit: Payer: Medicare Other

## 2017-12-04 ENCOUNTER — Ambulatory Visit: Payer: Medicare Other

## 2017-12-05 ENCOUNTER — Ambulatory Visit: Payer: Medicare Other

## 2017-12-08 ENCOUNTER — Ambulatory Visit: Payer: Medicare Other

## 2017-12-09 ENCOUNTER — Ambulatory Visit: Payer: Medicare Other

## 2017-12-17 ENCOUNTER — Other Ambulatory Visit: Payer: Self-pay

## 2017-12-17 ENCOUNTER — Encounter: Payer: Self-pay | Admitting: Urology

## 2017-12-17 ENCOUNTER — Ambulatory Visit
Admission: RE | Admit: 2017-12-17 | Discharge: 2017-12-17 | Disposition: A | Discharge status: Home / Self Care | Payer: Medicare Other | Source: Ambulatory Visit | Attending: Urology | Admitting: Urology

## 2017-12-17 VITALS — BP 128/87 | HR 93 | Temp 97.9°F | Resp 20 | Ht 74.0 in | Wt 279.0 lb

## 2017-12-17 DIAGNOSIS — Z923 Personal history of irradiation: Secondary | ICD-10-CM | POA: Insufficient documentation

## 2017-12-17 DIAGNOSIS — C61 Malignant neoplasm of prostate: Secondary | ICD-10-CM | POA: Insufficient documentation

## 2017-12-17 DIAGNOSIS — Z79899 Other long term (current) drug therapy: Secondary | ICD-10-CM | POA: Insufficient documentation

## 2017-12-17 NOTE — Progress Notes (Signed)
Radiation Oncology         [phone] ________________________________  Name: Jacaden Forbush MRN: [id number]  Date: 12/17/2017  DOB: 22-Jun-1945  Post Treatment Note  CC: London Pepper, MD  Franchot Gallo, MD  Diagnosis:   72 y.o.gentleman with stage T1c adenocarcinoma of the prostate with Gleason Score of 3+4, PSA of 7.87     Interval Since Last Radiation:  3.5 weeks  10/14/17 - 11/21/17:  The prostate was treated to 70 Gy in 25 fractions of 2.5 Gy  Narrative:  The patient returns today for routine follow-up.  He tolerated radiation treatment relatively well.  He experienced some minor urinary irritation and modest fatigue with nocturia that resolved by the end of treatment as well as straining to urinate, urgency, frequency, and increasing fatigue and dysuria.  He denied hematuria throughout treatment.  His pain and fatigue were noted to be the worst during his 18th fraction of treatment. He denied loose stools throughout, but towards the end of treatment, he reported rectal burning and diarrhea x6-8 per day lasting 2 days.  By the end of treatment, he reported burning with urination and dribbling urine during the day.                               On review of systems, the patient states that he is doing well overall.  He reports a gradual improvement in his lower urinary tract symptoms but continues with increased daytime frequency, urgency and intermittency but feels that he is emptying his bladder well with voiding.  He reports nocturia x1 and specifically denies dysuria, gross hematuria, weak stream, straining to void or incontinence.  His current IPSS score is 14 indicating moderate urinary symptoms.  He denies any significant impact on his energy or stamina.  Overall, he is quite pleased with his progress to date.  He has a scheduled follow-up visit with Dr. Diona Fanti on 01/07/2018.  ALLERGIES:  is allergic to sulfa antibiotics.  Meds: Current Outpatient Medications  Medication Sig  Dispense Refill  . acetaminophen (TYLENOL) 500 MG tablet Take 1,000 mg by mouth every 6 (six) hours as needed for moderate pain.    Marland Kitchen amLODipine (NORVASC) 10 MG tablet Take 5 mg by mouth daily.    Marland Kitchen buPROPion (WELLBUTRIN) 100 MG tablet Take 100 mg by mouth every morning.    . clonazePAM (KLONOPIN) 0.5 MG tablet     . diazepam (VALIUM) 10 MG tablet TK WITH YOU TO MRI APPOINTMENT  0  . sertraline (ZOLOFT) 100 MG tablet Take 150 mg by mouth daily.     . TRAZODONE HCL PO Take by mouth.    . gabapentin (NEURONTIN) 300 MG capsule      No current facility-administered medications for this encounter.     Physical Findings:  height is 6\' 2"  (1.88 m) and weight is 279 lb (126.6 kg). His oral temperature is 97.9 F (36.6 C). His blood pressure is 128/87 and his pulse is 93. His respiration is 20 and oxygen saturation is 98%.  Pain Assessment Pain Score: 0-No pain/10 In general this is a well appearing Caucasian male in no acute distress.  He's alert and oriented x4 and appropriate throughout the examination. Cardiopulmonary assessment is negative for acute distress and he exhibits normal effort.   Lab Findings: Lab Results  Component Value Date   WBC 9.1 09/13/2016   HGB 14.8 09/13/2016   HCT 42.7 09/13/2016   MCV 83.2  09/13/2016   PLT 264 09/13/2016     Radiographic Findings: No results found.  Impression/Plan: 1. 72 y.o.gentleman with stage T1c adenocarcinoma of the prostate with Gleason Score of 3+4, PSA of 7.87.   He will continue to follow up with urology for ongoing PSA determinations and has an appointment scheduled with Dr. Diona Fanti on 01/07/2018. He understands what to expect with regards to PSA monitoring going forward. I will look forward to following his response to treatment via correspondence with urology, and would be happy to continue to participate in his care if clinically indicated. I talked to the patient about what to expect in the future, including his risk for  erectile dysfunction and rectal bleeding. I encouraged him to call or return to the office if he has any questions regarding his previous radiation or possible radiation side effects. He was comfortable with this plan and will follow up as needed.    Nicholos Johns, PA-C

## 2018-01-07 DIAGNOSIS — N5201 Erectile dysfunction due to arterial insufficiency: Secondary | ICD-10-CM | POA: Diagnosis not present

## 2018-01-07 DIAGNOSIS — C61 Malignant neoplasm of prostate: Secondary | ICD-10-CM | POA: Diagnosis not present

## 2018-01-16 DIAGNOSIS — S2232XA Fracture of one rib, left side, initial encounter for closed fracture: Secondary | ICD-10-CM | POA: Diagnosis not present

## 2018-01-16 DIAGNOSIS — M25512 Pain in left shoulder: Secondary | ICD-10-CM | POA: Diagnosis not present

## 2018-01-19 DIAGNOSIS — F410 Panic disorder [episodic paroxysmal anxiety] without agoraphobia: Secondary | ICD-10-CM | POA: Diagnosis not present

## 2018-01-19 DIAGNOSIS — F331 Major depressive disorder, recurrent, moderate: Secondary | ICD-10-CM | POA: Diagnosis not present

## 2018-01-20 ENCOUNTER — Inpatient Hospital Stay (HOSPITAL_COMMUNITY)
Admission: EM | Admit: 2018-01-20 | Discharge: 2018-01-27 | DRG: 100 | Disposition: A | Discharge status: Other Acute Inpatient Hospital | Payer: Medicare Other | Attending: Family Medicine | Admitting: Family Medicine

## 2018-01-20 DIAGNOSIS — C61 Malignant neoplasm of prostate: Secondary | ICD-10-CM | POA: Diagnosis not present

## 2018-01-20 DIAGNOSIS — F3181 Bipolar II disorder: Secondary | ICD-10-CM | POA: Diagnosis not present

## 2018-01-20 DIAGNOSIS — S2232XA Fracture of one rib, left side, initial encounter for closed fracture: Secondary | ICD-10-CM | POA: Diagnosis present

## 2018-01-20 DIAGNOSIS — R4182 Altered mental status, unspecified: Secondary | ICD-10-CM

## 2018-01-20 DIAGNOSIS — G40409 Other generalized epilepsy and epileptic syndromes, not intractable, without status epilepticus: Secondary | ICD-10-CM | POA: Diagnosis not present

## 2018-01-20 DIAGNOSIS — R569 Unspecified convulsions: Secondary | ICD-10-CM

## 2018-01-20 DIAGNOSIS — G92 Toxic encephalopathy: Secondary | ICD-10-CM | POA: Diagnosis not present

## 2018-01-20 DIAGNOSIS — Z9114 Patient's other noncompliance with medication regimen: Secondary | ICD-10-CM

## 2018-01-20 DIAGNOSIS — Y93K1 Activity, walking an animal: Secondary | ICD-10-CM

## 2018-01-20 DIAGNOSIS — F410 Panic disorder [episodic paroxysmal anxiety] without agoraphobia: Secondary | ICD-10-CM | POA: Diagnosis present

## 2018-01-20 DIAGNOSIS — F419 Anxiety disorder, unspecified: Secondary | ICD-10-CM | POA: Diagnosis present

## 2018-01-20 DIAGNOSIS — M109 Gout, unspecified: Secondary | ICD-10-CM | POA: Diagnosis present

## 2018-01-20 DIAGNOSIS — F332 Major depressive disorder, recurrent severe without psychotic features: Secondary | ICD-10-CM | POA: Diagnosis present

## 2018-01-20 DIAGNOSIS — R32 Unspecified urinary incontinence: Secondary | ICD-10-CM | POA: Diagnosis present

## 2018-01-20 DIAGNOSIS — F430 Acute stress reaction: Secondary | ICD-10-CM

## 2018-01-20 DIAGNOSIS — E512 Wernicke's encephalopathy: Secondary | ICD-10-CM | POA: Diagnosis not present

## 2018-01-20 DIAGNOSIS — Z882 Allergy status to sulfonamides status: Secondary | ICD-10-CM

## 2018-01-20 DIAGNOSIS — Z6836 Body mass index (BMI) 36.0-36.9, adult: Secondary | ICD-10-CM

## 2018-01-20 DIAGNOSIS — I10 Essential (primary) hypertension: Secondary | ICD-10-CM | POA: Diagnosis present

## 2018-01-20 DIAGNOSIS — G2401 Drug induced subacute dyskinesia: Secondary | ICD-10-CM | POA: Diagnosis present

## 2018-01-20 DIAGNOSIS — F4310 Post-traumatic stress disorder, unspecified: Secondary | ICD-10-CM | POA: Diagnosis present

## 2018-01-20 DIAGNOSIS — W19XXXA Unspecified fall, initial encounter: Secondary | ICD-10-CM | POA: Diagnosis present

## 2018-01-20 DIAGNOSIS — G9341 Metabolic encephalopathy: Secondary | ICD-10-CM

## 2018-01-20 DIAGNOSIS — F0390 Unspecified dementia without behavioral disturbance: Secondary | ICD-10-CM | POA: Diagnosis present

## 2018-01-20 DIAGNOSIS — Z96653 Presence of artificial knee joint, bilateral: Secondary | ICD-10-CM | POA: Diagnosis present

## 2018-01-20 DIAGNOSIS — E538 Deficiency of other specified B group vitamins: Secondary | ICD-10-CM | POA: Diagnosis present

## 2018-01-20 DIAGNOSIS — M25472 Effusion, left ankle: Secondary | ICD-10-CM

## 2018-01-20 DIAGNOSIS — Z79899 Other long term (current) drug therapy: Secondary | ICD-10-CM

## 2018-01-20 LAB — ETHANOL: Alcohol, Ethyl (B): <10 mg/dL (ref <10)

## 2018-01-20 LAB — RAPID URINE DRUG SCREEN, HOSP PERFORMED
Amphetamines: NOT DETECTED
BARBITURATES: NOT DETECTED
BENZODIAZEPINES: POSITIVE — AB
COCAINE: NOT DETECTED
OPIATES: NOT DETECTED
TETRAHYDROCANNABINOL: NOT DETECTED

## 2018-01-20 LAB — CBC
HCT: 43.6 % (ref 39.0–52.0)
HEMOGLOBIN: 13.9 g/dL (ref 13.0–17.0)
MCH: 28.1 pg (ref 26.0–34.0)
MCHC: 31.9 g/dL (ref 30.0–36.0)
MCV: 88.3 fL (ref 80.0–100.0)
Platelets: 213 10*3/uL (ref 150–400)
RBC: 4.94 MIL/uL (ref 4.22–5.81)
RDW: 14.1 % (ref 11.5–15.5)
WBC: 8 10*3/uL (ref 4.0–10.5)
nRBC: 0 % (ref 0.0–0.2)

## 2018-01-20 LAB — URINALYSIS, ROUTINE W REFLEX MICROSCOPIC
BACTERIA UA: NONE SEEN
BILIRUBIN URINE: NEGATIVE
Glucose, UA: NEGATIVE mg/dL
Hgb urine dipstick: NEGATIVE
Ketones, ur: 80 mg/dL — AB
Leukocytes, UA: NEGATIVE
Nitrite: NEGATIVE
PROTEIN: 30 mg/dL — AB
SPECIFIC GRAVITY, URINE: 1.016 (ref 1.005–1.030)
pH: 6 (ref 5.0–8.0)

## 2018-01-20 LAB — COMPREHENSIVE METABOLIC PANEL
ALBUMIN: 4.5 g/dL (ref 3.5–5.0)
ALT: 19 U/L (ref 0–44)
ANION GAP: 13 (ref 5–15)
AST: 25 U/L (ref 15–41)
Alkaline Phosphatase: 69 U/L (ref 38–126)
BUN: 21 mg/dL (ref 8–23)
CO2: 24 mmol/L (ref 22–32)
Calcium: 9.3 mg/dL (ref 8.9–10.3)
Chloride: 105 mmol/L (ref 98–111)
Creatinine, Ser: 1.04 mg/dL (ref 0.61–1.24)
GFR calc Af Amer: >60 mL/min (ref >60)
GFR calc non Af Amer: >60 mL/min (ref >60)
GLUCOSE: 88 mg/dL (ref 70–99)
POTASSIUM: 3.8 mmol/L (ref 3.5–5.1)
SODIUM: 142 mmol/L (ref 135–145)
Total Bilirubin: 1.5 mg/dL — ABNORMAL HIGH (ref 0.3–1.2)
Total Protein: 7.7 g/dL (ref 6.5–8.1)

## 2018-01-20 MED ORDER — SERTRALINE HCL 100 MG PO TABS
200.0000 mg | ORAL_TABLET | Freq: Every day | ORAL | Status: DC
  Administered 2018-01-21 – 2018-01-27 (×7): 200 mg via ORAL
  Filled 2018-01-20 (×2): qty 2
  Filled 2018-01-20: qty 4
  Filled 2018-01-20: qty 2
  Filled 2018-01-20: qty 4
  Filled 2018-01-20 (×3): qty 2

## 2018-01-20 MED ORDER — AMLODIPINE BESYLATE 5 MG PO TABS
5.0000 mg | ORAL_TABLET | Freq: Every day | ORAL | Status: DC
  Administered 2018-01-20 – 2018-01-27 (×8): 5 mg via ORAL
  Filled 2018-01-20 (×8): qty 1

## 2018-01-20 MED ORDER — BUPROPION HCL ER (XL) 150 MG PO TB24
150.0000 mg | ORAL_TABLET | Freq: Every day | ORAL | Status: DC
  Filled 2018-01-20: qty 1

## 2018-01-20 MED ORDER — TRAZODONE HCL 50 MG PO TABS
50.0000 mg | ORAL_TABLET | Freq: Every day | ORAL | Status: DC
  Administered 2018-01-20 – 2018-01-22 (×3): 50 mg via ORAL
  Filled 2018-01-20 (×3): qty 1

## 2018-01-20 NOTE — ED Notes (Signed)
BH remains at bedside

## 2018-01-20 NOTE — ED Triage Notes (Signed)
Pt's wife an brother state the pt has been having intermittent periods of stuttering and going off on tangents. Pt's wife states the pt's symptoms began ~10 days ago and have gotten progressively worse. Pt saw psychiatrist yesterday and had medicationss adjusted and told to come to ED if symptoms did not improve.

## 2018-01-20 NOTE — BH Assessment (Signed)
Assessment Note  Calvin Santiago is a married 72 y.o. male who presents voluntarily to [facility] accompanied by his wife and his brother. Pt is a current patient of Dr. Darleene Cleaver & has been seeing him since 2018 when pt had 1st episode with similar sx. First episode occurred shortly after pt's mother was diagnosed with lung cancer. Most recent episode occurred shortly after pt visited his sick mother.  Per pt's family, Dr. Loni Muse recommended bringing pt to [facility] if sx returned. Pt presents with intermittent episodes of bizarre behavior. Pt initially presented withdrawn, flat, & appeared to struggle to understand what was asked of him & the content of family's description of his recent behavior. A short while later, pt was laughing, jovial, & telling detailed stories & jokes. He also shared a few theories of why things happening. Pt's wife states pt has been focused on "theories". When this writer returned from consult for disposition, pt was comfortably eating & chatting. He suddenly appeared to struggle remembering what he was talking about and began to run out of air as he continued to try to talk. Pt was encouraged to stop talking and slowly breath. He resisted for a while continuing to mouth words. Pt's RN was asked to come to pt's room. The episode was described to her with pt's involvement.  Pt denies SI- current & past. He also denies any HI. He denies AVH and sx of psychosis.  Pt has fair insight and judgment.  Pt's OP history includes Dr. Darleene Cleaver. IP history includes [place] in 2018. Pt denies alcohol/ substance abuse. ? MSE: Pt is dressed in scrubs, alert, oriented x4 with variable speech and normal motor behavior. Eye contact is good. Pt's mood is labile, anxious, jovial, pleasant, flat and affect is congruent with mood. Thought process is mostly but not always coherent and relevant. There is no indication pt is currently responding to internal stimuli or experiencing delusional thought content. Pt was cooperative  throughout assessment.    Disposition: Per Priscille Loveless, NP, overnight observation is recommended for stabilization and safety. Psychiatry to assess in morning.  Diagnosis: F41.0 Panic disorder [episodic paroxysmal anxiety]  Past Medical History:  Past Medical History:  Diagnosis Date  . Anxiety   . Bipolar 2 disorder ([place])   . Prostate cancer [facility])     Past Surgical History:  Procedure Laterality Date  . PROSTATE BIOPSY    . REPLACEMENT TOTAL KNEE     bilateral    Family History:  Family History  Problem Relation Age of Onset  . Prostate cancer Father   . Prostate cancer Brother   . Prostate cancer Maternal Grandfather   . Breast cancer Neg Hx   . Pancreatic cancer Neg Hx   . Colon cancer Neg Hx     Social History:  reports that he has never smoked. He has never used smokeless tobacco. He reports that he does not drink alcohol or use drugs.  Additional Social History:     CIWA: CIWA-Ar BP: (!) 149/95 Pulse Rate: (!) 47 COWS:    Allergies:  Allergies  Allergen Reactions  . Sulfa Antibiotics Other (See Comments)    As a child, he ran into walls    Home Medications:  (Not in a hospital admission)  OB/GYN Status:  No LMP for male patient.  General Assessment Data Location of Assessment: [facility] TTS Assessment: In system Is this a Tele or Face-to-Face Assessment?: Face-to-Face Is this an Initial Assessment or a Re-assessment for this encounter?: Initial Assessment  Patient Accompanied by:: (brother & wife) Language Other than English: No Living Arrangements: Other (Comment) What gender do you identify as?: Male Marital status: Married Living Arrangements: Spouse/significant other Can pt return to current living arrangement?: Yes Admission Status: Voluntary Is patient capable of signing voluntary admission?: Yes Referral Source: Geophysical data processor type: medicare     Crisis Care Plan Living Arrangements: Spouse/significant other Name of  Psychiatrist: Dr. Tiburcio Pea Name of Therapist: n/a  Education Status Is patient currently in school?: No  Risk to self with the past 6 months Suicidal Ideation: No Has patient been a risk to self within the past 6 months prior to admission? : No Suicidal Intent: No Has patient had any suicidal intent within the past 6 months prior to admission? : No Is patient at risk for suicide?: Yes Suicidal Plan?: No Has patient had any suicidal plan within the past 6 months prior to admission? : Yes Access to Means: No(p depression dx, guns with brother) What has been your use of drugs/alcohol within the last 12 months?: n/a Previous Attempts/Gestures: No Other Self Harm Risks: intense episodes of fear & anxiety Intentional Self Injurious Behavior: None Family Suicide History: No Recent stressful life event(s): Recent negative physical changes, Loss (Comment)(M dx lung Ca 1 yr ago; pt recently finished tx for Ca) Persecutory voices/beliefs?: No Depression: Yes Depression Symptoms: Despondent, Insomnia, Isolating, Feeling angry/irritable Substance abuse history and/or treatment for substance abuse?: No Suicide prevention information given to non-admitted patients: Not applicable  Risk to Others within the past 6 months Homicidal Ideation: No Does patient have any lifetime risk of violence toward others beyond the six months prior to admission? : No Thoughts of Harm to Others: No Current Homicidal Intent: No Current Homicidal Plan: No Access to Homicidal Means: No History of harm to others?: No Assessment of Violence: None Noted Violent Behavior Description: n/a Does patient have access to weapons?: No Criminal Charges Pending?: No Does patient have a court date: No Is patient on probation?: No  Psychosis Hallucinations: None noted Delusions: Unspecified(pt is focused on exposing theories)  Mental Status Report Appearance/Hygiene: Unremarkable, In scrubs Eye Contact: Good Motor  Activity: Freedom of movement Speech: Logical/coherent, Incoherent Level of Consciousness: Alert Mood: Anxious, Pleasant, Euthymic, Labile Affect: Anxious, Appropriate to circumstance, Blunted, Fearful, Labile, Other (Comment)(jovial) Anxiety Level: Panic Attacks Most recent panic attack: 6:30pm Thought Processes: Coherent, Relevant, Irrelevant, Tangential, Thought Blocking Judgement: Partial Orientation: Person, Place, Time, Situation Obsessive Compulsive Thoughts/Behaviors: None  Cognitive Functioning Concentration: Decreased Memory: Recent Impaired, Remote Impaired Is patient IDD: No Insight: Fair Impulse Control: Good Appetite: Good Have you had any weight changes? : (unknown) Sleep: No Change Vegetative Symptoms: None  ADLScreening [facility]) Patient's cognitive ability adequate to safely complete daily activities?: Yes Patient able to express need for assistance with ADLs?: Yes Independently performs ADLs?: Yes (appropriate for developmental age)  Prior Inpatient Therapy Prior Inpatient Therapy: Yes Prior Therapy Dates: 09/2016 Prior Therapy Facilty/Provider(s): [facility] Reason for Treatment: depression & anxiety  Prior Outpatient Therapy Prior Outpatient Therapy: Yes Prior Therapy Dates: ongoing Prior Therapy Facilty/Provider(s): Dr. Tiburcio Pea Reason for Treatment: anxiety & MDD Does patient have an ACCT team?: No Does patient have Intensive In-House Services?  : No Does patient have [facility] services? : No Does patient have P4CC services?: No  ADL Screening (condition at time of admission) Patient's cognitive ability adequate to safely complete daily activities?: Yes Patient able to express need for assistance with ADLs?: Yes Independently performs ADLs?: Yes (appropriate for developmental age)  Advance Directives (For Healthcare) Does Patient Have a Medical Advance Directive?: No Would patient like information on creating a  medical advance directive?: No - Patient declined          Disposition: Per Priscille Loveless, NP, overnight observation is recommended for stabilization and safety. Psychiatry to assess in morning. Disposition Initial Assessment Completed for this Encounter: Yes Disposition of Patient: (Per Priscille Loveless, NP, observe overnight. Assess in am)  On Site Evaluation by:   Reviewed with Physician:    Richardean Chimera 01/20/2018 7:53 PM

## 2018-01-20 NOTE — ED Notes (Signed)
BH at bedside

## 2018-01-20 NOTE — ED Notes (Signed)
Bed: WA32 Expected date:  Expected time:  Means of arrival:  Comments: Hold for 18 

## 2018-01-20 NOTE — ED Provider Notes (Signed)
[facility] DEPT Provider Note   CSN: [id number] Arrival date & time: 01/20/18  1242     History   Chief Complaint Chief Complaint  Patient presents with  . Altered Mental Status    HPI Calvin Santiago is a 72 y.o. male.  Patient with hx bipolar disorder, presents with 'irrational speech and behavior'. Pt very poor historian, with very limited insight into current symptoms - level 5 caveat. Pt indicates is compliant w meds. Family notes patient rapidly changes from talking about one topic to another, and moves from one activity to another, and that his speech and reasoning is non-sensical. They note similar symptoms in last, due to behavioral health issues. They note that since last psychiatric hospitalization for same, his symptoms had been stable and manageable, but now worse in past week. They indicate +recent stressor, including visiting mother who is ill, may have let to relapse of symptoms. They indicate they saw pts psychiatrist yesterday, Dr Darleene Cleaver, who tried to adjust meds, but that when pt became worse, advised them to go to ER.   The history is provided by the patient and a relative. The history is limited by the condition of the patient.  Altered Mental Status      Past Medical History:  Diagnosis Date  . Anxiety   . Bipolar 2 disorder ([place])   . Prostate cancer [facility])     Patient Active Problem List   Diagnosis Date Noted  . Malignant neoplasm of prostate ([place]) 07/31/2017  . Panic disorder (episodic paroxysmal anxiety) 09/14/2016  . Major depressive disorder, recurrent episode, severe ([place]) 09/14/2016    Past Surgical History:  Procedure Laterality Date  . PROSTATE BIOPSY    . REPLACEMENT TOTAL KNEE     bilateral        Home Medications    Prior to Admission medications   Medication Sig Start Date End Date Taking? Authorizing Provider  acetaminophen (TYLENOL) 500 MG tablet Take 1,000 mg by mouth every 6 (six) hours as  needed for moderate pain.    [provider]  amLODipine (NORVASC) 10 MG tablet Take 5 mg by mouth daily.    [provider]  buPROPion (WELLBUTRIN) 100 MG tablet Take 100 mg by mouth every morning.    [provider]  clonazePAM Bobbye Charleston) 0.5 MG tablet  05/06/17   [provider]  diazepam (VALIUM) 10 MG tablet TK WITH YOU TO MRI APPOINTMENT 05/14/17   [provider]  gabapentin (NEURONTIN) 300 MG capsule  05/13/17   [provider]  sertraline (ZOLOFT) 100 MG tablet Take 150 mg by mouth daily.     [provider]  TRAZODONE HCL PO Take by mouth.    [provider]    Family History Family History  Problem Relation Age of Onset  . Prostate cancer Father   . Prostate cancer Brother   . Prostate cancer Maternal Grandfather   . Breast cancer Neg Hx   . Pancreatic cancer Neg Hx   . Colon cancer Neg Hx     Social History Social History   Tobacco Use  . Smoking status: Never Smoker  . Smokeless tobacco: Never Used  Substance Use Topics  . Alcohol use: Never    Frequency: Never  . Drug use: Never     Allergies   Sulfa antibiotics   Review of Systems Review of Systems  Unable to perform ROS: Psychiatric disorder  Constitutional: Negative for fever.  Neurological: Negative for headaches.  level 5 caveat - psychiatric illness   Physical Exam Updated Vital Signs BP (!) 150/95 (BP Location: Left Arm)   Pulse 98   Temp 98.1 F (36.7 C) (Oral)   Resp 15   Ht 1.867 m (6' 1.5")   Wt 125.6 kg   SpO2 95%   BMI 36.05 kg/m   Physical Exam  Constitutional: He appears well-developed and well-nourished.  HENT:  Head: Atraumatic.  Mouth/Throat: Oropharynx is clear and moist.  Eyes: Pupils are equal, round, and reactive to light. Conjunctivae are normal. No scleral icterus.  Neck: Neck supple. No tracheal deviation present.  Cardiovascular: Normal rate, regular rhythm, normal heart sounds and intact distal  pulses.  Pulmonary/Chest: Effort normal and breath sounds normal. No accessory muscle usage. No respiratory distress.  Abdominal: Soft. Bowel sounds are normal. He exhibits no distension. There is no tenderness.  Genitourinary:  Genitourinary Comments: No cva tenderness  Musculoskeletal: He exhibits no edema or tenderness.  Neurological: He is alert.  Speech clear/fluent. Motor/sens grossly intact bil, stre 5/5.   Skin: Skin is warm and dry.  Psychiatric:  Unusual affect. Patient appears anxious at time. Has pressured speech, rapidly moves from one unrelated topic to next, disorganized thoughts.   Nursing note and vitals reviewed.    ED Treatments / Results  Labs (all labs ordered are listed, but only abnormal results are displayed) Results for orders placed or performed during the hospital encounter of 01/20/18  CBC  Result Value Ref Range   WBC 8.0 4.0 - 10.5 K/uL   RBC 4.94 4.22 - 5.81 MIL/uL   Hemoglobin 13.9 13.0 - 17.0 g/dL   HCT 43.6 39.0 - 52.0 %   MCV 88.3 80.0 - 100.0 fL   MCH 28.1 26.0 - 34.0 pg   MCHC 31.9 30.0 - 36.0 g/dL   RDW 14.1 11.5 - 15.5 %   Platelets 213 150 - 400 K/uL   nRBC 0.0 0.0 - 0.2 %  Comprehensive metabolic panel  Result Value Ref Range   Sodium 142 135 - 145 mmol/L   Potassium 3.8 3.5 - 5.1 mmol/L   Chloride 105 98 - 111 mmol/L   CO2 24 22 - 32 mmol/L   Glucose, Bld 88 70 - 99 mg/dL   BUN 21 8 - 23 mg/dL   Creatinine, Ser 1.04 0.61 - 1.24 mg/dL   Calcium 9.3 8.9 - 10.3 mg/dL   Total Protein 7.7 6.5 - 8.1 g/dL   Albumin 4.5 3.5 - 5.0 g/dL   AST 25 15 - 41 U/L   ALT 19 0 - 44 U/L   Alkaline Phosphatase 69 38 - 126 U/L   Total Bilirubin 1.5 (H) 0.3 - 1.2 mg/dL   GFR calc non Af Amer >60 >60 mL/min   GFR calc Af Amer >60 >60 mL/min   Anion gap 13 5 - 15  Ethanol  Result Value Ref Range   Alcohol, Ethyl (B) <10 <10 mg/dL  Rapid urine drug screen (hospital performed)  Result Value Ref Range   Opiates NONE DETECTED NONE DETECTED    Cocaine NONE DETECTED NONE DETECTED   Benzodiazepines POSITIVE (A) NONE DETECTED   Amphetamines NONE DETECTED NONE DETECTED   Tetrahydrocannabinol NONE DETECTED NONE DETECTED   Barbiturates NONE DETECTED NONE DETECTED  Urinalysis, Routine w reflex microscopic  Result Value Ref Range   Color, Urine YELLOW YELLOW   APPearance CLEAR CLEAR   Specific Gravity, Urine 1.016 1.005 - 1.030   pH 6.0 5.0 - 8.0   Glucose,  UA NEGATIVE NEGATIVE mg/dL   Hgb urine dipstick NEGATIVE NEGATIVE   Bilirubin Urine NEGATIVE NEGATIVE   Ketones, ur 80 (A) NEGATIVE mg/dL   Protein, ur 30 (A) NEGATIVE mg/dL   Nitrite NEGATIVE NEGATIVE   Leukocytes, UA NEGATIVE NEGATIVE   RBC / HPF 0-5 0 - 5 RBC/hpf   WBC, UA 0-5 0 - 5 WBC/hpf   Bacteria, UA NONE SEEN NONE SEEN   Squamous Epithelial / LPF 0-5 0 - 5   Mucus PRESENT    Hyaline Casts, UA PRESENT     EKG None  Radiology No results found.  Procedures Procedures (including critical care time)  Medications Ordered in ED Medications - No data to display   Initial Impression / Assessment and Plan / ED Course  I have reviewed the triage vital signs and the nursing notes.  Pertinent labs & imaging results that were available during my care of the patient were reviewed by me and considered in my medical decision making (see chart for details).  Labs sent.   [place] team consulted.  Reviewed nursing notes and prior charts for additional history.   Labs reviewed - chem normal.  Disposition per [facility] team - likely patient will need inpatient psychiatric treatment.     Final Clinical Impressions(s) / ED Diagnoses   Final diagnoses:  None    ED Discharge Orders    None       Lajean Saver, MD 01/20/18 1545

## 2018-01-20 NOTE — ED Notes (Signed)
[place] paged to check on status of evaluation

## 2018-01-21 ENCOUNTER — Observation Stay (HOSPITAL_COMMUNITY)
Admit: 2018-01-21 | Discharge: 2018-01-21 | Disposition: A | Discharge status: Home / Self Care | Payer: Medicare Other | Attending: Internal Medicine | Admitting: Internal Medicine

## 2018-01-21 ENCOUNTER — Other Ambulatory Visit: Payer: Self-pay

## 2018-01-21 ENCOUNTER — Observation Stay (HOSPITAL_COMMUNITY): Payer: Medicare Other

## 2018-01-21 ENCOUNTER — Emergency Department (HOSPITAL_COMMUNITY): Payer: Medicare Other

## 2018-01-21 DIAGNOSIS — F332 Major depressive disorder, recurrent severe without psychotic features: Secondary | ICD-10-CM

## 2018-01-21 DIAGNOSIS — R569 Unspecified convulsions: Secondary | ICD-10-CM | POA: Diagnosis not present

## 2018-01-21 DIAGNOSIS — D519 Vitamin B12 deficiency anemia, unspecified: Secondary | ICD-10-CM | POA: Diagnosis not present

## 2018-01-21 DIAGNOSIS — G9341 Metabolic encephalopathy: Secondary | ICD-10-CM | POA: Diagnosis not present

## 2018-01-21 DIAGNOSIS — I10 Essential (primary) hypertension: Secondary | ICD-10-CM

## 2018-01-21 DIAGNOSIS — D075 Carcinoma in situ of prostate: Secondary | ICD-10-CM

## 2018-01-21 DIAGNOSIS — R4182 Altered mental status, unspecified: Secondary | ICD-10-CM

## 2018-01-21 LAB — CBC
HCT: 42.3 % (ref 39.0–52.0)
Hemoglobin: 13.6 g/dL (ref 13.0–17.0)
MCH: 28.8 pg (ref 26.0–34.0)
MCHC: 32.2 g/dL (ref 30.0–36.0)
MCV: 89.4 fL (ref 80.0–100.0)
PLATELETS: 240 10*3/uL (ref 150–400)
RBC: 4.73 MIL/uL (ref 4.22–5.81)
RDW: 13.9 % (ref 11.5–15.5)
WBC: 10.4 10*3/uL (ref 4.0–10.5)
nRBC: 0 % (ref 0.0–0.2)

## 2018-01-21 LAB — CBC WITH DIFFERENTIAL/PLATELET
Abs Immature Granulocytes: 0.11 10*3/uL — ABNORMAL HIGH (ref 0.00–0.07)
BASOS PCT: 0 %
Basophils Absolute: 0 10*3/uL (ref 0.0–0.1)
EOS ABS: 0.2 10*3/uL (ref 0.0–0.5)
EOS PCT: 1 %
HCT: 50.2 % (ref 39.0–52.0)
Hemoglobin: 15.5 g/dL (ref 13.0–17.0)
Immature Granulocytes: 1 %
Lymphocytes Relative: 16 %
Lymphs Abs: 2.2 10*3/uL (ref 0.7–4.0)
MCH: 28.8 pg (ref 26.0–34.0)
MCHC: 30.9 g/dL (ref 30.0–36.0)
MCV: 93.1 fL (ref 80.0–100.0)
MONO ABS: 0.9 10*3/uL (ref 0.1–1.0)
MONOS PCT: 7 %
NEUTROS ABS: 10.1 10*3/uL — AB (ref 1.7–7.7)
Neutrophils Relative %: 75 %
PLATELETS: 285 10*3/uL (ref 150–400)
RBC: 5.39 MIL/uL (ref 4.22–5.81)
RDW: 14.2 % (ref 11.5–15.5)
WBC: 13.4 10*3/uL — AB (ref 4.0–10.5)
nRBC: 0 % (ref 0.0–0.2)

## 2018-01-21 LAB — CREATININE, SERUM
Creatinine, Ser: 0.79 mg/dL (ref 0.61–1.24)
GFR calc Af Amer: >60 mL/min (ref >60)
GFR calc non Af Amer: >60 mL/min (ref >60)

## 2018-01-21 LAB — CBG MONITORING, ED
GLUCOSE-CAPILLARY: 137 mg/dL — AB (ref 70–99)
Glucose-Capillary: 136 mg/dL — ABNORMAL HIGH (ref 70–99)

## 2018-01-21 LAB — BASIC METABOLIC PANEL
Anion gap: 23 — ABNORMAL HIGH (ref 5–15)
BUN: 23 mg/dL (ref 8–23)
CO2: 16 mmol/L — ABNORMAL LOW (ref 22–32)
CREATININE: 1.07 mg/dL (ref 0.61–1.24)
Calcium: 9.8 mg/dL (ref 8.9–10.3)
Chloride: 100 mmol/L (ref 98–111)
GFR calc Af Amer: >60 mL/min (ref >60)
GLUCOSE: 158 mg/dL — AB (ref 70–99)
POTASSIUM: 3.4 mmol/L — AB (ref 3.5–5.1)
SODIUM: 139 mmol/L (ref 135–145)

## 2018-01-21 LAB — I-STAT CHEM 8, ED
BUN: 28 mg/dL — AB (ref 8–23)
CALCIUM ION: 1.12 mmol/L — AB (ref 1.15–1.40)
CHLORIDE: 105 mmol/L (ref 98–111)
Creatinine, Ser: 0.9 mg/dL (ref 0.61–1.24)
GLUCOSE: 153 mg/dL — AB (ref 70–99)
HCT: 44 % (ref 39.0–52.0)
Hemoglobin: 15 g/dL (ref 13.0–17.0)
Potassium: 3.8 mmol/L (ref 3.5–5.1)
SODIUM: 140 mmol/L (ref 135–145)
TCO2: 22 mmol/L (ref 22–32)

## 2018-01-21 LAB — TSH: TSH: 0.58 u[IU]/mL (ref 0.350–4.500)

## 2018-01-21 LAB — GLUCOSE, CAPILLARY: Glucose-Capillary: 100 mg/dL — ABNORMAL HIGH (ref 70–99)

## 2018-01-21 LAB — AMMONIA: Ammonia: 31 umol/L (ref 9–35)

## 2018-01-21 LAB — VITAMIN B12: Vitamin B-12: 77 pg/mL — ABNORMAL LOW (ref 180–914)

## 2018-01-21 MED ORDER — BISACODYL 5 MG PO TBEC: 5.0000 mg | DELAYED_RELEASE_TABLET | Freq: Every day | ORAL | Status: DC | PRN

## 2018-01-21 MED ORDER — VITAMIN B-12 100 MCG PO TABS
100.0000 ug | ORAL_TABLET | Freq: Every day | ORAL | Status: DC
  Filled 2018-01-21: qty 1

## 2018-01-21 MED ORDER — ACETAMINOPHEN 650 MG RE SUPP: 650.0000 mg | Freq: Four times a day (QID) | RECTAL | Status: DC | PRN

## 2018-01-21 MED ORDER — CYANOCOBALAMIN 1000 MCG/ML IJ SOLN
1000.0000 ug | Freq: Once | INTRAMUSCULAR | Status: DC
  Filled 2018-01-21: qty 1

## 2018-01-21 MED ORDER — VALPROATE SODIUM 500 MG/5ML IV SOLN: 500.0000 mg | Freq: Two times a day (BID) | INTRAVENOUS | Status: DC

## 2018-01-21 MED ORDER — ACETAMINOPHEN 325 MG PO TABS
650.0000 mg | ORAL_TABLET | Freq: Four times a day (QID) | ORAL | Status: DC | PRN
  Filled 2018-01-21: qty 2

## 2018-01-21 MED ORDER — PHENYLEPHRINE HCL 1 % NA SOLN
1.0000 [drp] | Freq: Four times a day (QID) | NASAL | Status: DC | PRN
  Filled 2018-01-21: qty 15

## 2018-01-21 MED ORDER — ACETAMINOPHEN 500 MG PO TABS
1000.0000 mg | ORAL_TABLET | Freq: Four times a day (QID) | ORAL | Status: DC | PRN
  Administered 2018-01-24 (×2): 1000 mg via ORAL
  Filled 2018-01-21: qty 2

## 2018-01-21 MED ORDER — LEVETIRACETAM IN NACL 1000 MG/100ML IV SOLN
1000.0000 mg | Freq: Once | INTRAVENOUS | Status: AC
  Administered 2018-01-21: 1000 mg via INTRAVENOUS
  Filled 2018-01-21: qty 100

## 2018-01-21 MED ORDER — LORAZEPAM 2 MG/ML IJ SOLN
0.5000 mg | Freq: Once | INTRAMUSCULAR | Status: AC
  Administered 2018-01-21: 0.5 mg via INTRAVENOUS
  Filled 2018-01-21: qty 1

## 2018-01-21 MED ORDER — VALPROATE SODIUM 500 MG/5ML IV SOLN
500.0000 mg | Freq: Two times a day (BID) | INTRAVENOUS | Status: DC
  Administered 2018-01-22 – 2018-01-23 (×3): 500 mg via INTRAVENOUS
  Filled 2018-01-21 (×4): qty 5

## 2018-01-21 MED ORDER — SODIUM CHLORIDE 0.9 % IV SOLN
INTRAVENOUS | Status: DC
  Administered 2018-01-21 – 2018-01-22 (×2): via INTRAVENOUS

## 2018-01-21 MED ORDER — SENNOSIDES-DOCUSATE SODIUM 8.6-50 MG PO TABS: 1.0000 | ORAL_TABLET | Freq: Every evening | ORAL | Status: DC | PRN

## 2018-01-21 MED ORDER — THIAMINE HCL 100 MG/ML IJ SOLN
500.0000 mg | Freq: Three times a day (TID) | INTRAVENOUS | Status: DC
  Administered 2018-01-22 – 2018-01-23 (×3): 500 mg via INTRAVENOUS
  Filled 2018-01-21 (×6): qty 5

## 2018-01-21 MED ORDER — LORAZEPAM 2 MG/ML IJ SOLN
1.0000 mg | Freq: Once | INTRAMUSCULAR | Status: AC
  Administered 2018-01-21: 1 mg via INTRAVENOUS
  Filled 2018-01-21: qty 1

## 2018-01-21 MED ORDER — VALPROATE SODIUM 500 MG/5ML IV SOLN
1000.0000 mg | Freq: Once | INTRAVENOUS | Status: AC
  Administered 2018-01-22: 1000 mg via INTRAVENOUS
  Filled 2018-01-21: qty 10

## 2018-01-21 MED ORDER — CYANOCOBALAMIN 1000 MCG/ML IJ SOLN
1000.0000 ug | Freq: Once | INTRAMUSCULAR | Status: AC
  Administered 2018-01-22: 1000 ug via INTRAMUSCULAR
  Filled 2018-01-21: qty 1

## 2018-01-21 MED ORDER — CLONAZEPAM 0.5 MG PO TABS
0.5000 mg | ORAL_TABLET | Freq: Two times a day (BID) | ORAL | Status: DC
  Administered 2018-01-21 – 2018-01-27 (×13): 0.5 mg via ORAL
  Filled 2018-01-21 (×13): qty 1

## 2018-01-21 MED ORDER — GABAPENTIN 300 MG PO CAPS
300.0000 mg | ORAL_CAPSULE | Freq: Two times a day (BID) | ORAL | Status: DC
  Administered 2018-01-21 – 2018-01-27 (×13): 300 mg via ORAL
  Filled 2018-01-21 (×13): qty 1

## 2018-01-21 MED ORDER — ENOXAPARIN SODIUM 40 MG/0.4ML ~~LOC~~ SOLN
40.0000 mg | SUBCUTANEOUS | Status: DC
  Administered 2018-01-21 – 2018-01-26 (×6): 40 mg via SUBCUTANEOUS
  Filled 2018-01-21 (×6): qty 0.4

## 2018-01-21 MED ORDER — INSULIN ASPART 100 UNIT/ML ~~LOC~~ SOLN
0.0000 [IU] | Freq: Three times a day (TID) | SUBCUTANEOUS | Status: DC
  Administered 2018-01-21 – 2018-01-27 (×3): 1 [IU] via SUBCUTANEOUS
  Filled 2018-01-21: qty 1

## 2018-01-21 NOTE — Consult Note (Addendum)
[facility] Face-to-Face Psychiatry Consult   Reason for Consult:  Bizarre behavior  Referring Physician:  EDP Patient Identification: Calvin Santiago MRN:  [id number] Principal Diagnosis: Altered mental status Diagnosis:  Principal Problem:   Seizure ([place]) Active Problems:   Panic disorder (episodic paroxysmal anxiety)   Major depressive disorder, recurrent episode, severe ([place])   Malignant neoplasm of prostate ([place])   Acute metabolic encephalopathy   Total Time spent with patient: 30 minutes  Subjective:   Calvin Santiago is a 72 y.o. male patient admitted with bizarre behavior.  HPI:   Per chart review, patient was admitted with bizarre behavior. He reportedly went to visit his mother a couple days ago and has been acting bizarre since this time. His mother has terminal cancer. His presentation was similar a year ago when he presented to the hospital. He required admission to an inpatient psychiatric hospital in the setting of severe anxiety after his mother was diagnosed with lung cancer. Today he was unable to meaningfully participate in interview. He appeared confused. His wife was at bedside and reports that he was doing well since hospitalization a year ago until recently. He had a change in his mental status 5 days ago. He has been tangential in thought process and confused. He has intermittent lucid periods although there are longer periods of confusion. He has had worsening anxiety with periods of gasping for air. His wife administers his medications to ensure compliance. He is followed by Dr. Darleene Cleaver who recommended that he come to the hospital. He was admitted to the medicine service today due to concern for seizure. He was witnessed to have a generalized seizure this morning. Per primary team, his wife reports that he has had episodes of shaking and blank staring. He was loaded on Keppra and EEG is currently pending.   Past Psychiatric History: MDD and anxiety with panic attacks.   Risk to  Self: Suicidal Ideation: No Suicidal Intent: No Is patient at risk for suicide?: Yes Suicidal Plan?: No Access to Means: No(p depression dx, guns with brother) What has been your use of drugs/alcohol within the last 12 months?: n/a Other Self Harm Risks: intense episodes of fear & anxiety Intentional Self Injurious Behavior: None Risk to Others: Homicidal Ideation: No Thoughts of Harm to Others: No Current Homicidal Intent: No Current Homicidal Plan: No Access to Homicidal Means: No History of harm to others?: No Assessment of Violence: None Noted Violent Behavior Description: n/a Does patient have access to weapons?: No Criminal Charges Pending?: No Does patient have a court date: No Prior Inpatient Therapy: Prior Inpatient Therapy: Yes Prior Therapy Dates: 09/2016 Prior Therapy Facilty/Provider(s): [facility] Reason for Treatment: depression & anxiety Prior Outpatient Therapy: Prior Outpatient Therapy: Yes Prior Therapy Dates: ongoing Prior Therapy Facilty/Provider(s): Dr. Tiburcio Pea Reason for Treatment: anxiety & MDD Does patient have an ACCT team?: No Does patient have Intensive In-House Services?  : No Does patient have [facility] services? : No Does patient have P4CC services?: No  Past Medical History:  Past Medical History:  Diagnosis Date  . Anxiety   . Bipolar 2 disorder ([place])   . Prostate cancer [facility])     Past Surgical History:  Procedure Laterality Date  . PROSTATE BIOPSY    . REPLACEMENT TOTAL KNEE     bilateral   Family History:  Family History  Problem Relation Age of Onset  . Prostate cancer Father   . Prostate cancer Brother   . Prostate cancer Maternal Grandfather   . Breast cancer Neg  Hx   . Pancreatic cancer Neg Hx   . Colon cancer Neg Hx    Family Psychiatric  History: Denies  Social History:  Social History   Substance and Sexual Activity  Alcohol Use Never  . Frequency: Never     Social History   Substance and Sexual Activity  Drug Use  Never    Social History   Socioeconomic History  . Marital status: Married    Spouse name: Not on file  . Number of children: 1  . Years of education: Not on file  . Highest education level: Not on file  Occupational History  . Not on file  Social Needs  . Financial resource strain: Not on file  . Food insecurity:    Worry: Not on file    Inability: Not on file  . Transportation needs:    Medical: Not on file    Non-medical: Not on file  Tobacco Use  . Smoking status: Never Smoker  . Smokeless tobacco: Never Used  Substance and Sexual Activity  . Alcohol use: Never    Frequency: Never  . Drug use: Never  . Sexual activity: Not Currently  Lifestyle  . Physical activity:    Days per week: Not on file    Minutes per session: Not on file  . Stress: Not on file  Relationships  . Social connections:    Talks on phone: Not on file    Gets together: Not on file    Attends religious service: Not on file    Active member of club or organization: Not on file    Attends meetings of clubs or organizations: Not on file    Relationship status: Not on file  Other Topics Concern  . Not on file  Social History Narrative   12-17-17 Unable to ask abuse questions wife with with him today.   Additional Social History: He lives at home with his wife. He denies alcohol or illicit substance use.     Allergies:   Allergies  Allergen Reactions  . Sulfa Antibiotics Other (See Comments)    As a child, he ran into walls    Labs:  Results for orders placed or performed during the hospital encounter of 01/20/18 (from the past 48 hour(s))  CBC     Status: None   Collection Time: 01/20/18  1:57 PM  Result Value Ref Range   WBC 8.0 4.0 - 10.5 K/uL   RBC 4.94 4.22 - 5.81 MIL/uL   Hemoglobin 13.9 13.0 - 17.0 g/dL   HCT 43.6 39.0 - 52.0 %   MCV 88.3 80.0 - 100.0 fL   MCH 28.1 26.0 - 34.0 pg   MCHC 31.9 30.0 - 36.0 g/dL   RDW 14.1 11.5 - 15.5 %   Platelets 213 150 - 400 K/uL   nRBC 0.0  0.0 - 0.2 %    Comment: Performed at [facility], [place] [address], [place], [place] [address]  Comprehensive metabolic panel     Status: Abnormal   Collection Time: 01/20/18  1:57 PM  Result Value Ref Range   Sodium 142 135 - 145 mmol/L   Potassium 3.8 3.5 - 5.1 mmol/L   Chloride 105 98 - 111 mmol/L   CO2 24 22 - 32 mmol/L   Glucose, Bld 88 70 - 99 mg/dL   BUN 21 8 - 23 mg/dL   Creatinine, Ser 1.04 0.61 - 1.24 mg/dL   Calcium 9.3 8.9 - 10.3 mg/dL   Total  Protein 7.7 6.5 - 8.1 g/dL   Albumin 4.5 3.5 - 5.0 g/dL   AST 25 15 - 41 U/L   ALT 19 0 - 44 U/L   Alkaline Phosphatase 69 38 - 126 U/L   Total Bilirubin 1.5 (H) 0.3 - 1.2 mg/dL   GFR calc non Af Amer >60 >60 mL/min   GFR calc Af Amer >60 >60 mL/min    Comment: (NOTE) The eGFR has been calculated using the CKD EPI equation. This calculation has not been validated in all clinical situations. eGFR's persistently <60 mL/min signify possible Chronic Kidney Disease.    Anion gap 13 5 - 15    Comment: Performed at [facility], [place] [address], [place], [place] [address]  Ethanol     Status: None   Collection Time: 01/20/18  1:57 PM  Result Value Ref Range   Alcohol, Ethyl (B) <10 <10 mg/dL    Comment: (NOTE) Lowest detectable limit for serum alcohol is 10 mg/dL. For medical purposes only. Performed at [facility], [place] [address] [place], [place] [address]   Rapid urine drug screen (hospital performed)     Status: Abnormal   Collection Time: 01/20/18  2:59 PM  Result Value Ref Range   Opiates NONE DETECTED NONE DETECTED   Cocaine NONE DETECTED NONE DETECTED   Benzodiazepines POSITIVE (A) NONE DETECTED   Amphetamines NONE DETECTED NONE DETECTED   Tetrahydrocannabinol NONE DETECTED NONE DETECTED   Barbiturates NONE DETECTED NONE DETECTED    Comment: (NOTE) DRUG SCREEN FOR MEDICAL PURPOSES ONLY.  IF CONFIRMATION IS NEEDED FOR ANY PURPOSE, NOTIFY LAB WITHIN 5  DAYS. LOWEST DETECTABLE LIMITS FOR URINE DRUG SCREEN Drug Class                     Cutoff (ng/mL) Amphetamine and metabolites    1000 Barbiturate and metabolites    200 Benzodiazepine                 275 Tricyclics and metabolites     300 Opiates and metabolites        300 Cocaine and metabolites        300 THC                            50 Performed at [facility], [place] [address] [place], [place] [address]   Urinalysis, Routine w reflex microscopic     Status: Abnormal   Collection Time: 01/20/18  2:59 PM  Result Value Ref Range   Color, Urine YELLOW YELLOW   APPearance CLEAR CLEAR   Specific Gravity, Urine 1.016 1.005 - 1.030   pH 6.0 5.0 - 8.0   Glucose, UA NEGATIVE NEGATIVE mg/dL   Hgb urine dipstick NEGATIVE NEGATIVE   Bilirubin Urine NEGATIVE NEGATIVE   Ketones, ur 80 (A) NEGATIVE mg/dL   Protein, ur 30 (A) NEGATIVE mg/dL   Nitrite NEGATIVE NEGATIVE   Leukocytes, UA NEGATIVE NEGATIVE   RBC / HPF 0-5 0 - 5 RBC/hpf   WBC, UA 0-5 0 - 5 WBC/hpf   Bacteria, UA NONE SEEN NONE SEEN   Squamous Epithelial / LPF 0-5 0 - 5   Mucus PRESENT    Hyaline Casts, UA PRESENT     Comment: Performed at [facility], [place] [address] [place],  [address]  POC CBG, ED     Status: Abnormal   Collection Time: 01/21/18  6:31  AM  Result Value Ref Range   Glucose-Capillary 137 (H) 70 - 99 mg/dL   Comment 1 Notify RN   CBC with Differential     Status: Abnormal   Collection Time: 01/21/18  6:35 AM  Result Value Ref Range   WBC 13.4 (H) 4.0 - 10.5 K/uL   RBC 5.39 4.22 - 5.81 MIL/uL   Hemoglobin 15.5 13.0 - 17.0 g/dL   HCT 50.2 39.0 - 52.0 %   MCV 93.1 80.0 - 100.0 fL   MCH 28.8 26.0 - 34.0 pg   MCHC 30.9 30.0 - 36.0 g/dL   RDW 14.2 11.5 - 15.5 %   Platelets 285 150 - 400 K/uL   nRBC 0.0 0.0 - 0.2 %   Neutrophils Relative % 75 %   Neutro Abs 10.1 (H) 1.7 - 7.7 K/uL   Lymphocytes Relative 16 %   Lymphs Abs 2.2 0.7 - 4.0 K/uL   Monocytes  Relative 7 %   Monocytes Absolute 0.9 0.1 - 1.0 K/uL   Eosinophils Relative 1 %   Eosinophils Absolute 0.2 0.0 - 0.5 K/uL   Basophils Relative 0 %   Basophils Absolute 0.0 0.0 - 0.1 K/uL   Immature Granulocytes 1 %   Abs Immature Granulocytes 0.11 (H) 0.00 - 0.07 K/uL    Comment: Performed at [facility], [place] [address] [place], [place] [address]  Basic metabolic panel     Status: Abnormal   Collection Time: 01/21/18  6:35 AM  Result Value Ref Range   Sodium 139 135 - 145 mmol/L    Comment: REPEATED TO VERIFY   Potassium 3.4 (L) 3.5 - 5.1 mmol/L   Chloride 100 98 - 111 mmol/L    Comment: REPEATED TO VERIFY   CO2 16 (L) 22 - 32 mmol/L    Comment: REPEATED TO VERIFY   Glucose, Bld 158 (H) 70 - 99 mg/dL   BUN 23 8 - 23 mg/dL   Creatinine, Ser 1.07 0.61 - 1.24 mg/dL   Calcium 9.8 8.9 - 10.3 mg/dL   GFR calc non Af Amer >60 >60 mL/min   GFR calc Af Amer >60 >60 mL/min    Comment: (NOTE) The eGFR has been calculated using the CKD EPI equation. This calculation has not been validated in all clinical situations. eGFR's persistently <60 mL/min signify possible Chronic Kidney Disease.    Anion gap 23 (H) 5 - 15    Comment: REPEATED TO VERIFY Performed at [facility], [place] [address] [place], [place] [address]   I-Stat Chem 8, ED     Status: Abnormal   Collection Time: 01/21/18  6:44 AM  Result Value Ref Range   Sodium 140 135 - 145 mmol/L   Potassium 3.8 3.5 - 5.1 mmol/L   Chloride 105 98 - 111 mmol/L   BUN 28 (H) 8 - 23 mg/dL   Creatinine, Ser 0.90 0.61 - 1.24 mg/dL   Glucose, Bld 153 (H) 70 - 99 mg/dL   Calcium, Ion 1.12 (L) 1.15 - 1.40 mmol/L   TCO2 22 22 - 32 mmol/L   Hemoglobin 15.0 13.0 - 17.0 g/dL   HCT 44.0 39.0 - 52.0 %  CBC     Status: None   Collection Time: 01/21/18 10:55 AM  Result Value Ref Range   WBC 10.4 4.0 - 10.5 K/uL   RBC 4.73 4.22 - 5.81 MIL/uL   Hemoglobin 13.6 13.0 - 17.0 g/dL   HCT 42.3 39.0 - 52.0 %   MCV  89.4 80.0 - 100.0 fL   MCH 28.8 26.0 - 34.0 pg   MCHC 32.2 30.0 - 36.0 g/dL   RDW 13.9 11.5 - 15.5 %   Platelets 240 150 - 400 K/uL   nRBC 0.0 0.0 - 0.2 %    Comment: Performed at [facility], [place] [address] [place], [place] [address]  Creatinine, serum     Status: None   Collection Time: 01/21/18 10:55 AM  Result Value Ref Range   Creatinine, Ser 0.79 0.61 - 1.24 mg/dL   GFR calc non Af Amer >60 >60 mL/min   GFR calc Af Amer >60 >60 mL/min    Comment: (NOTE) The eGFR has been calculated using the CKD EPI equation. This calculation has not been validated in all clinical situations. eGFR's persistently <60 mL/min signify possible Chronic Kidney Disease. Performed at [facility], [place] [address] [place], [place] [address]   TSH     Status: None   Collection Time: 01/21/18 10:55 AM  Result Value Ref Range   TSH 0.580 0.350 - 4.500 uIU/mL    Comment: Performed by a 3rd Generation assay with a functional sensitivity of <=0.01 uIU/mL. Performed at [facility], [place] [address], [place], [place] [address]   Vitamin B12     Status: Abnormal   Collection Time: 01/21/18 10:55 AM  Result Value Ref Range   Vitamin B-12 77 (L) 180 - 914 pg/mL    Comment: (NOTE) This assay is not validated for testing neonatal or myeloproliferative syndrome specimens for Vitamin B12 levels. Performed at [facility], [place] [address], [place], [place] [address]     Current Facility-Administered Medications  Medication Dose Route Frequency Provider Last Rate Last Dose  . 0.9 %  sodium chloride infusion   Intravenous Continuous Amin, Ankit Chirag, MD      . acetaminophen (TYLENOL) tablet 650 mg  650 mg Oral Q6H PRN Amin, Ankit Chirag, MD       Or  . acetaminophen (TYLENOL) suppository 650 mg  650 mg Rectal Q6H PRN Amin, Ankit Chirag, MD      . acetaminophen (TYLENOL) tablet 1,000 mg  1,000 mg Oral Q6H PRN Amin, Ankit Chirag, MD       . amLODipine (NORVASC) tablet 5 mg  5 mg Oral Daily Amin, Ankit Chirag, MD   5 mg at 01/20/18 1852  . bisacodyl (DULCOLAX) EC tablet 5 mg  5 mg Oral Daily PRN Amin, Ankit Chirag, MD      . clonazePAM (KLONOPIN) tablet 0.5 mg  0.5 mg Oral BID Amin, Ankit Chirag, MD      . enoxaparin (LOVENOX) injection 40 mg  40 mg Subcutaneous Q24H Amin, Ankit Chirag, MD      . gabapentin (NEURONTIN) capsule 300 mg  300 mg Oral BID Amin, Ankit Chirag, MD      . insulin aspart (novoLOG) injection 0-9 Units  0-9 Units Subcutaneous TID WC Amin, Ankit Chirag, MD      . phenylephrine (NEO-SYNEPHRINE) 1 % nasal drops 1 drop  1 drop Each Nare Q6H PRN Amin, Ankit Chirag, MD      . senna-docusate (Senokot-S) tablet 1 tablet  1 tablet Oral QHS PRN Amin, Ankit Chirag, MD      . sertraline (ZOLOFT) tablet 200 mg  200 mg Oral Daily Damita Lack, MD   Stopped at 01/20/18 1902  . traZODone (DESYREL) tablet 50 mg  50 mg Oral QHS Amin, Jeanella Flattery, MD  50 mg at 01/20/18 2118   Current Outpatient Medications  Medication Sig Dispense Refill  . acetaminophen (TYLENOL) 500 MG tablet Take 1,000 mg by mouth every 6 (six) hours as needed for moderate pain.    Marland Kitchen amLODipine (NORVASC) 5 MG tablet Take 5 mg by mouth daily.    Marland Kitchen buPROPion (WELLBUTRIN XL) 150 MG 24 hr tablet Take 150 mg by mouth daily.    . clonazePAM (KLONOPIN) 0.5 MG tablet Take 0.5 mg by mouth 2 (two) times daily.     Marland Kitchen gabapentin (NEURONTIN) 300 MG capsule Take 300 mg by mouth 2 (two) times daily.     . phenylephrine (NEO-SYNEPHRINE) 1 % nasal spray Place 1 drop into both nostrils every 6 (six) hours as needed for congestion.    . sertraline (ZOLOFT) 100 MG tablet Take 200 mg by mouth daily.     . traZODone (DESYREL) 50 MG tablet Take 50 mg by mouth at bedtime.      Musculoskeletal: Strength & Muscle Tone: Generalized weakness Gait & Station: unable to stand Patient leans: N/A  Psychiatric Specialty Exam: Physical Exam  Nursing note and vitals  reviewed. Constitutional: He appears well-developed and well-nourished.  HENT:  Head: Normocephalic and atraumatic.  Neck: Normal range of motion.  Respiratory: Effort normal.  Musculoskeletal: Normal range of motion.  Neurological: He is alert.  Only oriented to self.   Psychiatric: Thought content normal. His mood appears anxious. His speech is delayed. He is slowed. Cognition and memory are impaired. He expresses inappropriate judgment.    Review of Systems  Psychiatric/Behavioral: Negative for substance abuse and suicidal ideas. The patient is nervous/anxious.   All other systems reviewed and are negative.   Blood pressure (!) 172/96, pulse 72, temperature 98.2 F (36.8 C), temperature source Oral, resp. rate (!) 33, height 6' 1.5" (1.867 m), weight 125.6 kg, SpO2 95 %.Body mass index is 36.05 kg/m.  General Appearance: Fairly Groomed, elderly, Caucasian male, wearing a hospital gown and lying in bed. NAD.   Eye Contact:  Fair  Speech:  Slow  Volume:  Decreased  Mood:  Anxious  Affect:  Congruent  Thought Process:  Linear and Descriptions of Associations: Intact  Orientation:  Other:  Only oriented to self.  Thought Content:  Illogical  Suicidal Thoughts:  No  Homicidal Thoughts:  No  Memory:  Immediate;   Poor Recent;   Poor Remote;   Poor  Judgement:  Impaired  Insight:  Lacking  Psychomotor Activity:  Decreased  Concentration:  Concentration: Poor and Attention Span: Poor  Recall:  Poor  Fund of Knowledge:  Fair  Language:  Fair  Akathisia:  No  Handed:  Right  AIMS (if indicated):   N/A  Assets:  Housing Intimacy Social Support  ADL's:  Intact  Cognition:  Impaired due to medical condition.  Sleep:   N/A   Assessment:  Calvin Santiago is a 72 y.o. male who was admitted with bizarre behavior and was witnessed to have a seizure on admission. He is only oriented to self and unable to have a meaningful conversation. He has a history of anxiety and depression  although he had been stable and recently had an acute change in his mental status 5 days ago after visiting her mother. It is unclear if his presentation is due to a stress reaction in the setting of significant stressors versus medical (possible untreated seizure disorder). He is admitted to the medical floor for further workup so disposition is contingent on further workup.  He may require inpatient psychiatric hospitalization if workup is unremarkable.    Treatment Plan Summary: -Continue Zoloft 200 mg daily for depression and anxiety. -Continue Gabapentin 300 mg BID for anxiety. -Continue Klonopin 0.5 mg BID for anxiety. This medication is currently being tapered to discontinuation by his outpatient doctor.  -Wellbutrin has been discontinued due to risk of lowering the seizure threshold. -Would consider another antiepileptic drug given risk of psychiatric symptoms with Keppra use.  -Psychiatry will follow as needed.   Disposition: Disposition is contingent on medical workup.  Faythe Dingwall, DO 01/21/2018 1:21 PM

## 2018-01-21 NOTE — Progress Notes (Signed)
Wife upset because someone from [facility] notified that wife patient wasn't getting admitted to the hospital after my admission this morning. Nurse notified me about this confusion therefore I spoke with the wife again to assure here he is getting admitted to the hospital for overnight observation atleast until medically cleared before he can go to psych facility.   Wife is well updated and aware of thsi Care of plan. I assured here patient will only be discharged from the hospital by my team once cleared by Neurology and routine work up is completed. She admitted she was well informed this morning regarding this plan as well. Nurse present during my interaction with them in the room.   Call with questions if needed.

## 2018-01-21 NOTE — Consult Note (Addendum)
Neurology Consultation  Reason for Consult: AMS, new onset seizure Referring Physician: Dr. Gerlean Ren, MD ([facility])  CC: AMS x1-2 weeks, new onset seizure  History is obtained from: Chart and wife on phone (T: [phone])  HPI: Calvin Santiago is a 72 y.o. male with PMH of anxiety, Bipolar disorder, prostate Ca s/p radiation and resultant incontinence, and possible cognitive decline over past 2-3 years, and a psychiatric hospitalization last year for extreme anxiety provoked by his mother's diagnosis of lung cancer, who presented to [facility] long hospital today for evaluation of progressively worsening altered mental status for at least 1 to 2 weeks.  According to his wife, who I spoke with in detail over the phone for over 35 minutes together history, he was doing reasonably well at about 1 to 2 weeks ago when he started exhibiting some bizarre behavior after he went to see his mother who was in a hospice facility. The patient last year, also exhibited similar kind of bizarre behavior after finding out that her mother has been diagnosed with lung cancer, to the point where he had to be admitted to a psych facility in [place].  He was discharged home in a stable condition.  For the past few days/week he has been exhibiting extremely tangential thoughts and has been appearing confused.  He has today, refused to recognize his wife or his brother.  The family also reported episodes of shaking and blank staring.  While in the hospital, he had a witnessed generalized tonic-clonic seizure for about 3 minutes as documented by the patient's RN.  He was started on Keppra. The wife reports that he has not been driving for 2 years.  When I asked the reason for that, she said that he has not been able to focus and giving the example such as when he backed out of the driveway he did not know what side of the median to go to to drive.  She denied any parkinsonian symptoms prior to this presentation or acting  out of his dreams but does report that he has off-and-on jaw jerking and tongue thrusting movements which have been more noticeable over the past few weeks to months. The patient was unable to provide me with any history.  He was in the room with a [employer] cabin crew and he kept talking without responding to any questions.  His speech was clear but his sentences had no relation to the situation or what was being asked to him.  EEG done at [facility] showed generalized background slowing indicative of encephalopathy.  No seizures or epileptic form activity was seen.  The wife also reports that he has had decreased appetite for the past few days to weeks and has not been eating much but only been drinking water.  She said that he has been acting differently at times not acknowledging people at all and at other times completely having lucid coherent conversations with them. She denied any hallucinations that she has noticed.  Patient has a history of prostate cancer status post radiation and the wife reports that per their last meeting with the oncologist, he was told that he is 90% cured with no spread to any other part of the body.  LKW: 2 weeks ago tpa given?: no, outside window Premorbid modified Rankin scale (mRS): 2   ROS: Unable to obtain due to altered mental status.   Past Medical History:  Diagnosis Date  . Anxiety   . Bipolar 2 disorder ([place])   .  Prostate cancer [facility])    Family History  Problem Relation Age of Onset  . Prostate cancer Father   . Prostate cancer Brother   . Prostate cancer Maternal Grandfather   . Breast cancer Neg Hx   . Pancreatic cancer Neg Hx   . Colon cancer Neg Hx    Social History:   reports that he has never smoked. He has never used smokeless tobacco. He reports that he does not drink alcohol or use drugs.  Medications  Current Facility-Administered Medications:  .  0.9 %  sodium chloride infusion, , Intravenous, Continuous, Amin, Ankit  Chirag, MD, Last Rate: 100 mL/hr at 01/21/18 1939 .  acetaminophen (TYLENOL) tablet 650 mg, 650 mg, Oral, Q6H PRN **OR** acetaminophen (TYLENOL) suppository 650 mg, 650 mg, Rectal, Q6H PRN, Amin, Ankit Chirag, MD .  acetaminophen (TYLENOL) tablet 1,000 mg, 1,000 mg, Oral, Q6H PRN, Amin, Ankit Chirag, MD .  amLODipine (NORVASC) tablet 5 mg, 5 mg, Oral, Daily, Amin, Ankit Chirag, MD, 5 mg at 01/21/18 1503 .  bisacodyl (DULCOLAX) EC tablet 5 mg, 5 mg, Oral, Daily PRN, Amin, Ankit Chirag, MD .  clonazePAM (KLONOPIN) tablet 0.5 mg, 0.5 mg, Oral, BID, Amin, Ankit Chirag, MD, 0.5 mg at 01/21/18 2031 .  cyanocobalamin ((VITAMIN B-12)) injection 1,000 mcg, 1,000 mcg, Intramuscular, Once **FOLLOWED BY** [START ON 01/22/2018] vitamin B-12 (CYANOCOBALAMIN) tablet 100 mcg, 100 mcg, Oral, Daily, Amin, Ankit Chirag, MD .  enoxaparin (LOVENOX) injection 40 mg, 40 mg, Subcutaneous, Q24H, Amin, Ankit Chirag, MD, 40 mg at 01/21/18 2210 .  gabapentin (NEURONTIN) capsule 300 mg, 300 mg, Oral, BID, Amin, Ankit Chirag, MD, 300 mg at 01/21/18 2210 .  insulin aspart (novoLOG) injection 0-9 Units, 0-9 Units, Subcutaneous, TID WC, Amin, Ankit Chirag, MD, 1 Units at 01/21/18 1504 .  phenylephrine (NEO-SYNEPHRINE) 1 % nasal drops 1 drop, 1 drop, Each Nare, Q6H PRN, Amin, Ankit Chirag, MD .  senna-docusate (Senokot-S) tablet 1 tablet, 1 tablet, Oral, QHS PRN, Amin, Ankit Chirag, MD .  sertraline (ZOLOFT) tablet 200 mg, 200 mg, Oral, Daily, Amin, Ankit Chirag, MD, 200 mg at 01/21/18 1500 .  traZODone (DESYREL) tablet 50 mg, 50 mg, Oral, QHS, Amin, Ankit Chirag, MD, 50 mg at 01/21/18 2210  Exam: Current vital signs: BP 138/90 (BP Location: Right Arm)   Pulse 85   Temp 98.7 F (37.1 C) (Oral)   Resp 20   Ht 6' 1.5" (1.867 m)   Wt 125.6 kg   SpO2 94%   BMI 36.05 kg/m  Vital signs in last 24 hours: Temp:  [98 F (36.7 C)-98.7 F (37.1 C)] 98.7 F (37.1 C) (11/20 2128) Pulse Rate:  [72-103] 85 (11/20 2128) Resp:   [18-33] 20 (11/20 2128) BP: (108-172)/(71-100) 138/90 (11/20 2128) SpO2:  [92 %-100 %] 94 % (11/20 2128) General examination: Patient is laying in bed, talking nonstop without provocation in complete sentences-which in themselves make perfect sense but are completely without context. HEENT: Normocephalic, atraumatic, very dry oral mucous membranes. CVS: S1-S2 heard, regular rate rhythm Lungs: Clear to auscultation Abdomen: Nondistended nontender Extremities: Warm well perfused next line neurological exam Mental status: Patient is awake, alert.  He is unable to tell me his name or answer any questions to commands.  He refuses to make eye contact.  He has extremely poor attention concentration span. His speech is not dysarthric. He would not name, repeat or follow any commands. His sentences were complete by themselves but completely without context Cranial nerves: Pupils are equal round reactive  to light, he was able to track me on both sides but seemed to prefer the left side where his sitter was sitting-unclear that was because of just a sitter being there or due to any other reason.  His face appeared symmetric. Motor exam: He is appears to move all 4 extremities with equal strength with no focal motor deficits. Sensory exam: Intact to noxious ablation in all 4 extremities Coordination: Difficult to assess because of his inability to follow commands.  Has some resting tremor and mild increased tone in the right upper extremity.  Normal tone in the left upper extremity with no tremor.  Some involuntary jaw movements noticed off and on. DTRs: 2+ all over.  Could not elicit plantar responses due to withdrawal. Gait testing was deferred at this time. NIHSS 1a Level of Conscious.: 0 1b LOC Questions: 2 1c LOC Commands:2  2 Best Gaze: 0 3 Visual: 0 4 Facial Palsy:0  5a Motor Arm - left:0  5b Motor Arm - Right:0  6a Motor Leg - Left: 0 6b Motor Leg - Right:0  7 Limb Ataxia: 0 8 Sensory:  0 9 Best Language:2  10 Dysarthria: 0 11 Extinct. and Inatten.:0  TOTAL: 6  Labs I have reviewed labs in epic and the results pertinent to this consultation are: CBC    Component Value Date/Time   WBC 10.4 01/21/2018 1055   RBC 4.73 01/21/2018 1055   HGB 13.6 01/21/2018 1055   HCT 42.3 01/21/2018 1055   PLT 240 01/21/2018 1055   MCV 89.4 01/21/2018 1055   MCH 28.8 01/21/2018 1055   MCHC 32.2 01/21/2018 1055   RDW 13.9 01/21/2018 1055   LYMPHSABS 2.2 01/21/2018 0635   MONOABS 0.9 01/21/2018 0635   EOSABS 0.2 01/21/2018 0635   BASOSABS 0.0 01/21/2018 0635   CMP     Component Value Date/Time   NA 140 01/21/2018 0644   K 3.8 01/21/2018 0644   CL 105 01/21/2018 0644   CO2 16 (L) 01/21/2018 0635   GLUCOSE 153 (H) 01/21/2018 0644   BUN 28 (H) 01/21/2018 0644   CREATININE 0.79 01/21/2018 1055   CALCIUM 9.8 01/21/2018 0635   PROT 7.7 01/20/2018 1357   ALBUMIN 4.5 01/20/2018 1357   AST 25 01/20/2018 1357   ALT 19 01/20/2018 1357   ALKPHOS 69 01/20/2018 1357   BILITOT 1.5 (H) 01/20/2018 1357   GFRNONAA >60 01/21/2018 1055   GFRAA >60 01/21/2018 1055   EEG: generalized slowing  Imaging I have reviewed the images obtained: CT-scan of the brain-no acute changes Was sent for MRI but did not cooperate  Assessment:  72 year old man with a past medical history of anxiety, bipolar disorder, prostate cancer status post radiation and resultant incontinence and possible cognitive decline that has been ongoing for the past 2 to 3 years and a psychiatric hospitalization last year for extreme anxiety provoked by mother's diagnosis of lung cancer presented to [facility] long hospital for evaluation of progressively worsening altered mental status for at least a week. He is exhibiting extremely bizarre behavior, according to family similar to what he had exhibited last year prior to his psychiatric hospitalization in the presence of stressors-mother's diagnosis of lung cancer. He recently  visited his mother in the hospice facility after many many months, and has bizarre behavior followed that visit. I am not sure if this is a manifestation of dementia with behavioral disturbance based on the history provided by the family that he has not been driving and unable to take  care of a lot of his personal affairs for the past 2 years or if this is purely psychiatric. The seizure that happened today, could have been multifactorial-lowering of seizure threshold due to psychiatric medications versus seizure in the setting of dementia/neurodegenerative conditions. He was started on Keppra-that could also make him a little agitated-changing to Depakote. Psychiatry is on board and following the patient as well. In addition to this, he has severely low levels of vitamin B12, which could be contributing to his altered mental status. I would also like to check thiamine levels because of his poor p.o. intake- to rule out Wernicke's. Some of the involuntary movement that he exhibits might be reflective of underlying tardive dyskinesias.  There might also be a component of underlying parkinsonism.  Impression: -Toxic metabolic encephalopathy-multifactorial including B12 deficiency/Wernicke's. -Possible underlying ?Parkinsonism -Tardive dyskinesia (repeated jaw and tongue movements) -Possible underlying cognitive decline/dementia with acute behavioral disturbance -Possible stress reaction leading to worsening of anxiety/depression as per Psychiatry  Recommendations: -MRI brain with and without when possible. May need sedation -Agree with him being on antiepileptics given the possible history of dementia and also being on seizure threshold lowering medications- would recommend changing Keppra to Depakote (load 1 g IV now followed by 500 twice daily.) -Formal neuropsych testing and outpatient movement disorders appointment -Discontinue/decrease Zoloft, Trazodone as much as possible with guidance from  Psychiatry -Continue Klonopin and Neurontin for now. -B12 replacement -Thiamine level  -Replace Thiamine after drawing levels -Check RPR, ammonia levels -Appreciate psychiatry recommendations on meds and would appreciate assitance with reducing/managing medications to minimize tardive dyskinesia. -Consider LP if no improvement in the coming few days (low suspicion for infectious process due to time frame and atypical presentation). Also consider paraneoplastic panel.  I had a detailed discussion with his wife together history, explained my exam and the philosophy of care. I described in detail that his current presentation is confusing and I do not have a definitive answer to his current presentation but I feel that this might be multifactorial- dementia with behavioral disturbance versus acute stress reaction versus toxic metabolic encephalopathy in the setting of B12 deficiency/Wernicke's.  His wife was extremely tired because of a long day at hospital and made notes of parts of our conversation.  I told her that neurology service will be available and will update her with results as they happen.   -- Amie Portland, MD [facility] Pager: [phone] If 7pm to 7am, please call on call as listed on AMION.

## 2018-01-21 NOTE — ED Notes (Signed)
Pt went to use the bathroom. Was left alone to use the toilet. After sitting on the toilet for about greater than 10 minutes, NT checked on pt and he verbalized being OK.  A little after that, pt let out a very loud scream. This Probation officer and some other staff members ran to the room to find pt in the bathroom stretched out on the commode with head against the corner of the wall and increased work of breathing (deep, long and hard respirations). Pt was having a seizure with jerking movements in upper extremities and legs stretched out straight. Seizure lasted for less than 3 minutes. Help was requested and upon arrival, pt was transferred to Main ED. Pt sustained a skin tear to his back during seizure activity.

## 2018-01-21 NOTE — BHH Counselor (Signed)
Per Dr. Mariea Clonts patient will be medically admitted. Will likely require psych hospitalization once medically cleared.

## 2018-01-21 NOTE — H&P (Addendum)
History and Physical    Calvin Santiago WJX:[id number] DOB: 1945/06/24 DOA: 01/20/2018  PCP: Calvin Pepper, MD Patient coming from: Home  Chief Complaint: Altered mental status  HPI: Calvin Santiago is a 72 y.o. male with medical history significant of recurrent severe major depressive disorder, adenocarcinoma prostate, anxiety and panic attack was brought to the hospital for evaluation of change in mental status at home.  History is per wife at bedside and the brother.  Patient is awake but not alert and oriented.  No focal neurologic deficit. According to the wife for past several years patient has been suffering from severe depressive disorder with frequent phases of panic attacks and anxiety.  Reports that at times he is talking normally and then next second he could be in " [place]".  Back in 2130 he had a metabolic work-up which was negative but was found to have urinary retention.  Had undergone some sort of prostate treatment back then and recently undergoing radiation therapy for his prostate cancer. While in the ER as he was being evaluated by behavioral health personnel, patient had an episode of generalized seizure.  Prior to this patient was alert awake oriented ask for and since his seizure episode he has been pleasantly confused.  CT of the head done in the ER was negative.  Case was discussed by the ER provider with neurology who recommended Ativan and Keppra loading.  Per wife patient does not have diagnosis of seizure disorder in the past.  Upon further questioning to the wife and his brother it appears that off-and-on patient has had constant moving of his jaw at times, upper extremity tremor, blank staring in the space.  I have explained them that these could be the signs of seizures as well.  Difficult to obtain social history and family history from the patient given his mentation.   Review of Systems: As per HPI otherwise 10 point review of systems negative.  Review of  Systems Otherwise negative except as per HPI, including: Unable to obtain due to his mentation  Past Medical History:  Diagnosis Date  . Anxiety   . Bipolar 2 disorder ([place])   . Prostate cancer [facility])     Past Surgical History:  Procedure Laterality Date  . PROSTATE BIOPSY    . REPLACEMENT TOTAL KNEE     bilateral    SOCIAL HISTORY:  reports that he has never smoked. He has never used smokeless tobacco. He reports that he does not drink alcohol or use drugs.  Allergies  Allergen Reactions  . Sulfa Antibiotics Other (See Comments)    As a child, he ran into walls    FAMILY HISTORY: Family History  Problem Relation Age of Onset  . Prostate cancer Father   . Prostate cancer Brother   . Prostate cancer Maternal Grandfather   . Breast cancer Neg Hx   . Pancreatic cancer Neg Hx   . Colon cancer Neg Hx      Prior to Admission medications   Medication Sig Start Date End Date Taking? Authorizing Provider  acetaminophen (TYLENOL) 500 MG tablet Take 1,000 mg by mouth every 6 (six) hours as needed for moderate pain.   Yes [provider]  amLODipine (NORVASC) 5 MG tablet Take 5 mg by mouth daily. 12/13/17  Yes [provider]  buPROPion (WELLBUTRIN XL) 150 MG 24 hr tablet Take 150 mg by mouth daily. 11/11/17  Yes [provider]  clonazePAM (KLONOPIN) 0.5 MG tablet Take 0.5 mg by  mouth 2 (two) times daily.  05/06/17  Yes [provider]  gabapentin (NEURONTIN) 300 MG capsule Take 300 mg by mouth 2 (two) times daily.  05/13/17  Yes [provider]  phenylephrine (NEO-SYNEPHRINE) 1 % nasal spray Place 1 drop into both nostrils every 6 (six) hours as needed for congestion.   Yes [provider]  sertraline (ZOLOFT) 100 MG tablet Take 200 mg by mouth daily.    Yes [provider]  traZODone (DESYREL) 50 MG tablet Take 50 mg by mouth at bedtime. 11/11/17  Yes [provider]    Physical Exam: Vitals:   01/21/18 0642  01/21/18 0645 01/21/18 0700 01/21/18 0825  BP: (!) 145/83 108/73 138/88 (!) 144/79  Pulse: 93 90 81 78  Resp: 20 18 19 18   Temp: 98.2 F (36.8 C)     TempSrc: Oral     SpO2: 93% 94% 94% 96%  Weight:      Height:          Constitutional: NAD, calm, comfortable, awake and follows all the commands Eyes: PERRL, lids and conjunctivae normal ENMT: Mucous membranes are moist. Posterior pharynx clear of any exudate or lesions.Normal dentition.  Neck: normal, supple, no masses, no thyromegaly Respiratory: clear to auscultation bilaterally, no wheezing, no crackles. Normal respiratory effort. No accessory muscle use.  Cardiovascular: Regular rate and rhythm, no murmurs / rubs / gallops. No extremity edema. 2+ pedal pulses. No carotid bruits.  Abdomen: no tenderness, no masses palpated. No hepatosplenomegaly. Bowel sounds positive.  Musculoskeletal: no clubbing / cyanosis. No joint deformity upper and lower extremities. Good ROM, no contractures. Normal muscle tone.  Skin: no rashes, lesions, ulcers. No induration Neurologic: CN 2-12 grossly intact. Sensation intact, DTR normal. Strength 5/5 in all 4.  Psychiatric:  Alert and oriented x 0.  Poor judgment    Labs on Admission: I have personally reviewed following labs and imaging studies  CBC: Recent Labs  Lab 01/20/18 1357 01/21/18 0635 01/21/18 0644  WBC 8.0 13.4*  --   NEUTROABS  --  10.1*  --   HGB 13.9 15.5 15.0  HCT 43.6 50.2 44.0  MCV 88.3 93.1  --   PLT 213 285  --    Basic Metabolic Panel: Recent Labs  Lab 01/20/18 1357 01/21/18 0635 01/21/18 0644  NA 142 139 140  K 3.8 3.4* 3.8  CL 105 100 105  CO2 24 16*  --   GLUCOSE 88 158* 153*  BUN 21 23 28*  CREATININE 1.04 1.07 0.90  CALCIUM 9.3 9.8  --    GFR: Estimated Creatinine Clearance: 102.3 mL/min (by C-G formula based on SCr of 0.9 mg/dL). Liver Function Tests: Recent Labs  Lab 01/20/18 1357  AST 25  ALT 19  ALKPHOS 69  BILITOT 1.5*  PROT 7.7  ALBUMIN  4.5   No results for input(s): LIPASE, AMYLASE in the last 168 hours. No results for input(s): AMMONIA in the last 168 hours. Coagulation Profile: No results for input(s): INR, PROTIME in the last 168 hours. Cardiac Enzymes: No results for input(s): CKTOTAL, CKMB, CKMBINDEX, TROPONINI in the last 168 hours. BNP (last 3 results) No results for input(s): PROBNP in the last 8760 hours. HbA1C: No results for input(s): HGBA1C in the last 72 hours. CBG: Recent Labs  Lab 01/21/18 0631  GLUCAP 137*   Lipid Profile: No results for input(s): CHOL, HDL, LDLCALC, TRIG, CHOLHDL, LDLDIRECT in the last 72 hours. Thyroid Function Tests: No results for input(s): TSH, T4TOTAL, FREET4,  T3FREE, THYROIDAB in the last 72 hours. Anemia Panel: No results for input(s): VITAMINB12, FOLATE, FERRITIN, TIBC, IRON, RETICCTPCT in the last 72 hours. Urine analysis:    Component Value Date/Time   COLORURINE YELLOW 01/20/2018 1459   APPEARANCEUR CLEAR 01/20/2018 1459   LABSPEC 1.016 01/20/2018 1459   PHURINE 6.0 01/20/2018 1459   GLUCOSEU NEGATIVE 01/20/2018 1459   HGBUR NEGATIVE 01/20/2018 1459   BILIRUBINUR NEGATIVE 01/20/2018 1459   KETONESUR 80 (A) 01/20/2018 1459   PROTEINUR 30 (A) 01/20/2018 1459   NITRITE NEGATIVE 01/20/2018 1459   LEUKOCYTESUR NEGATIVE 01/20/2018 1459   Sepsis Labs: !!!!!!!!!!!!!!!!!!!!!!!!!!!!!!!!!!!!!!!!!!!! @LABRCNTIP (procalcitonin:4,lacticidven:4) )No results found for this or any previous visit (from the past 240 hour(s)).   Radiological Exams on Admission: Ct Head Wo Contrast  Result Date: 01/21/2018 CLINICAL DATA:  Seizure-like activity.  Prostate cancer. EXAM: CT HEAD WITHOUT CONTRAST TECHNIQUE: Contiguous axial images were obtained from the base of the skull through the vertex without intravenous contrast. COMPARISON:  05/30/2017 FINDINGS: Brain: Cerebral and cerebellar atrophy. No mass lesion, hemorrhage, hydrocephalus, acute infarct, intra-axial, or extra-axial fluid  collection. Vascular: No hyperdense vessel or unexpected calcification. Skull: Normal Sinuses/Orbits: Normal imaged portions of the orbits and globes. Clear paranasal sinuses and mastoid air cells. Other: None. IMPRESSION: No acute intracranial abnormality. Electronically Signed   By: Calvin Santiago M.D.   On: 01/21/2018 07:31     All images have been reviewed by me personally.   Assessment/Plan Principal Problem:   Seizure ([place]) Active Problems:   Panic disorder (episodic paroxysmal anxiety)   Major depressive disorder, recurrent episode, severe (HCC)   Malignant neoplasm of prostate ([place])   Acute metabolic encephalopathy    Seizure episode, grand mal Acute metabolic encephalopathy, postictal versus delirious - Admit for observation.  Patient has no history of seizures.  CT of the head is negative.  Currently patient is awake but only alert to his name.  No focal neurologic sign.  Baseline he is awake alert oriented x4 - Neurology consulted who recommended Ativan and Keppra loading.  I plan on holding Keppra for now until his EEG or any sedative medications.  But if neurology thinks otherwise, it can be started now. -We will order EEG, check B12, folate and TSH -Seizure precaution - Sitter in place. -We will bladder scan him.  UA is negative, no obvious signs of infections.  History of recurrent major depressive disorder, severe - On outpatient he is on Wellbutrin, Klonopin, gabapentin.  Will hold off on Wellbutrin today.  Have neurology evaluate him.  Eventually he will likely end up needing inpatient psychiatry placement when medically cleared.  Stage T1c adenocarcinoma prostate -Currently undergoing radiotherapy at the cancer center    DVT prophylaxis: Lovenox Code Status: Full code Family Communication: Wife and brother at bedside Disposition Plan: To be determined Consults called: Neurology Admission status: Medical floor for observation   Time Spent: 65 minutes.  >50%  of the time was devoted to discussing the patients care, assessment, plan and disposition with other care givers along with counseling the patient about the risks and benefits of treatment.    Kieran Arreguin Arsenio Loader MD [facility] Pager [phone]  If 7PM-7AM, please contact night-coverage [email or web address] Password [facility]  01/21/2018, 9:33 AM

## 2018-01-21 NOTE — Progress Notes (Signed)
EEG completed, results pending. 

## 2018-01-21 NOTE — ED Notes (Signed)
ED TO INPATIENT HANDOFF REPORT  Name/Age/Gender Calvin Santiago 72 y.o. male  Code Status    Code Status Orders  (From admission, onward)         Start     Ordered   01/21/18 0927  Full code  Continuous     01/21/18 0928        Code Status History    Date Active Date Inactive Code Status Order ID Comments User Context   01/20/2018 1342 01/21/2018 0928 Full Code [id number]  Lajean Saver, MD ED   09/13/2016 1323 09/16/2016 2232 Full Code [id number]  Duffy Bruce, MD ED      Home/SNF/Other Home  Chief Complaint incoherent   Level of Care/Admitting Diagnosis ED Disposition    ED Disposition Condition [facility] Area: [facility] [[id number]]  Level of Care: Med-Surg [16]  Diagnosis: Seizure [facility]) [157262]  Admitting Physician: Gerlean Ren [facility] [[id number]]  Attending Physician: Gerlean Ren [facility] [[id number]]  PT Class (Do Not Modify): Observation [104]  PT Acc Code (Do Not Modify): Observation [10022]       Medical History Past Medical History:  Diagnosis Date  . Anxiety   . Bipolar 2 disorder ([place])   . Prostate cancer (HCC)     Allergies Allergies  Allergen Reactions  . Sulfa Antibiotics Other (See Comments)    As a child, he ran into walls    IV Location/Drains/Wounds Patient Lines/Drains/Airways Status   Active Line/Drains/Airways    Name:   Placement date:   Placement time:   Site:   Days:   Peripheral IV 01/21/18 Left Antecubital   01/21/18    0711    Antecubital   less than 1          Labs/Imaging Results for orders placed or performed during the hospital encounter of 01/20/18 (from the past 48 hour(s))  CBC     Status: None   Collection Time: 01/20/18  1:57 PM  Result Value Ref Range   WBC 8.0 4.0 - 10.5 K/uL   RBC 4.94 4.22 - 5.81 MIL/uL   Hemoglobin 13.9 13.0 - 17.0 g/dL   HCT 43.6 39.0 - 52.0 %   MCV 88.3 80.0 - 100.0 fL   MCH 28.1 26.0 - 34.0 pg   MCHC 31.9 30.0 - 36.0 g/dL   RDW 14.1 11.5 - 15.5 %    Platelets 213 150 - 400 K/uL   nRBC 0.0 0.0 - 0.2 %    Comment: Performed at [facility], [place] [address], [place], [place] [address]  Comprehensive metabolic panel     Status: Abnormal   Collection Time: 01/20/18  1:57 PM  Result Value Ref Range   Sodium 142 135 - 145 mmol/L   Potassium 3.8 3.5 - 5.1 mmol/L   Chloride 105 98 - 111 mmol/L   CO2 24 22 - 32 mmol/L   Glucose, Bld 88 70 - 99 mg/dL   BUN 21 8 - 23 mg/dL   Creatinine, Ser 1.04 0.61 - 1.24 mg/dL   Calcium 9.3 8.9 - 10.3 mg/dL   Total Protein 7.7 6.5 - 8.1 g/dL   Albumin 4.5 3.5 - 5.0 g/dL   AST 25 15 - 41 U/L   ALT 19 0 - 44 U/L   Alkaline Phosphatase 69 38 - 126 U/L   Total Bilirubin 1.5 (H) 0.3 - 1.2 mg/dL   GFR calc non Af Amer >60 >60 mL/min   GFR calc Af Amer >60 >60 mL/min  Comment: (NOTE) The eGFR has been calculated using the CKD EPI equation. This calculation has not been validated in all clinical situations. eGFR's persistently <60 mL/min signify possible Chronic Kidney Disease.    Anion gap 13 5 - 15    Comment: Performed at [facility], [place] [address], [place], [place] [address]  Ethanol     Status: None   Collection Time: 01/20/18  1:57 PM  Result Value Ref Range   Alcohol, Ethyl (B) <10 <10 mg/dL    Comment: (NOTE) Lowest detectable limit for serum alcohol is 10 mg/dL. For medical purposes only. Performed at [facility], [place] [address] [place], [place] [address]   Rapid urine drug screen (hospital performed)     Status: Abnormal   Collection Time: 01/20/18  2:59 PM  Result Value Ref Range   Opiates NONE DETECTED NONE DETECTED   Cocaine NONE DETECTED NONE DETECTED   Benzodiazepines POSITIVE (A) NONE DETECTED   Amphetamines NONE DETECTED NONE DETECTED   Tetrahydrocannabinol NONE DETECTED NONE DETECTED   Barbiturates NONE DETECTED NONE DETECTED    Comment: (NOTE) DRUG SCREEN FOR MEDICAL PURPOSES ONLY.  IF CONFIRMATION IS NEEDED FOR  ANY PURPOSE, NOTIFY LAB WITHIN 5 DAYS. LOWEST DETECTABLE LIMITS FOR URINE DRUG SCREEN Drug Class                     Cutoff (ng/mL) Amphetamine and metabolites    1000 Barbiturate and metabolites    200 Benzodiazepine                 211 Tricyclics and metabolites     300 Opiates and metabolites        300 Cocaine and metabolites        300 THC                            50 Performed at [facility], [place] [address] [place], [place] [address]   Urinalysis, Routine w reflex microscopic     Status: Abnormal   Collection Time: 01/20/18  2:59 PM  Result Value Ref Range   Color, Urine YELLOW YELLOW   APPearance CLEAR CLEAR   Specific Gravity, Urine 1.016 1.005 - 1.030   pH 6.0 5.0 - 8.0   Glucose, UA NEGATIVE NEGATIVE mg/dL   Hgb urine dipstick NEGATIVE NEGATIVE   Bilirubin Urine NEGATIVE NEGATIVE   Ketones, ur 80 (A) NEGATIVE mg/dL   Protein, ur 30 (A) NEGATIVE mg/dL   Nitrite NEGATIVE NEGATIVE   Leukocytes, UA NEGATIVE NEGATIVE   RBC / HPF 0-5 0 - 5 RBC/hpf   WBC, UA 0-5 0 - 5 WBC/hpf   Bacteria, UA NONE SEEN NONE SEEN   Squamous Epithelial / LPF 0-5 0 - 5   Mucus PRESENT    Hyaline Casts, UA PRESENT     Comment: Performed at [facility], [place] [address], [place], [place] [address]  POC CBG, ED     Status: Abnormal   Collection Time: 01/21/18  6:31 AM  Result Value Ref Range   Glucose-Capillary 137 (H) 70 - 99 mg/dL   Comment 1 Notify RN   CBC with Differential     Status: Abnormal   Collection Time: 01/21/18  6:35 AM  Result Value Ref Range   WBC 13.4 (H) 4.0 - 10.5 K/uL   RBC 5.39 4.22 - 5.81 MIL/uL   Hemoglobin 15.5 13.0 - 17.0 g/dL  HCT 50.2 39.0 - 52.0 %   MCV 93.1 80.0 - 100.0 fL   MCH 28.8 26.0 - 34.0 pg   MCHC 30.9 30.0 - 36.0 g/dL   RDW 14.2 11.5 - 15.5 %   Platelets 285 150 - 400 K/uL   nRBC 0.0 0.0 - 0.2 %   Neutrophils Relative % 75 %   Neutro Abs 10.1 (H) 1.7 - 7.7 K/uL   Lymphocytes Relative 16 %   Lymphs Abs  2.2 0.7 - 4.0 K/uL   Monocytes Relative 7 %   Monocytes Absolute 0.9 0.1 - 1.0 K/uL   Eosinophils Relative 1 %   Eosinophils Absolute 0.2 0.0 - 0.5 K/uL   Basophils Relative 0 %   Basophils Absolute 0.0 0.0 - 0.1 K/uL   Immature Granulocytes 1 %   Abs Immature Granulocytes 0.11 (H) 0.00 - 0.07 K/uL    Comment: Performed at [facility], [place] [address] [place], [place] [address]  Basic metabolic panel     Status: Abnormal   Collection Time: 01/21/18  6:35 AM  Result Value Ref Range   Sodium 139 135 - 145 mmol/L    Comment: REPEATED TO VERIFY   Potassium 3.4 (L) 3.5 - 5.1 mmol/L   Chloride 100 98 - 111 mmol/L    Comment: REPEATED TO VERIFY   CO2 16 (L) 22 - 32 mmol/L    Comment: REPEATED TO VERIFY   Glucose, Bld 158 (H) 70 - 99 mg/dL   BUN 23 8 - 23 mg/dL   Creatinine, Ser 1.07 0.61 - 1.24 mg/dL   Calcium 9.8 8.9 - 10.3 mg/dL   GFR calc non Af Amer >60 >60 mL/min   GFR calc Af Amer >60 >60 mL/min    Comment: (NOTE) The eGFR has been calculated using the CKD EPI equation. This calculation has not been validated in all clinical situations. eGFR's persistently <60 mL/min signify possible Chronic Kidney Disease.    Anion gap 23 (H) 5 - 15    Comment: REPEATED TO VERIFY Performed at [facility], [place] [address] [place], [place] [address]   I-Stat Chem 8, ED     Status: Abnormal   Collection Time: 01/21/18  6:44 AM  Result Value Ref Range   Sodium 140 135 - 145 mmol/L   Potassium 3.8 3.5 - 5.1 mmol/L   Chloride 105 98 - 111 mmol/L   BUN 28 (H) 8 - 23 mg/dL   Creatinine, Ser 0.90 0.61 - 1.24 mg/dL   Glucose, Bld 153 (H) 70 - 99 mg/dL   Calcium, Ion 1.12 (L) 1.15 - 1.40 mmol/L   TCO2 22 22 - 32 mmol/L   Hemoglobin 15.0 13.0 - 17.0 g/dL   HCT 44.0 39.0 - 52.0 %  CBC     Status: None   Collection Time: 01/21/18 10:55 AM  Result Value Ref Range   WBC 10.4 4.0 - 10.5 K/uL   RBC 4.73 4.22 - 5.81 MIL/uL   Hemoglobin 13.6 13.0 - 17.0 g/dL    HCT 42.3 39.0 - 52.0 %   MCV 89.4 80.0 - 100.0 fL   MCH 28.8 26.0 - 34.0 pg   MCHC 32.2 30.0 - 36.0 g/dL   RDW 13.9 11.5 - 15.5 %   Platelets 240 150 - 400 K/uL   nRBC 0.0 0.0 - 0.2 %    Comment: Performed at [facility], [place] [address] [place], [place] [address]  Creatinine, serum     Status: None  Collection Time: 01/21/18 10:55 AM  Result Value Ref Range   Creatinine, Ser 0.79 0.61 - 1.24 mg/dL   GFR calc non Af Amer >60 >60 mL/min   GFR calc Af Amer >60 >60 mL/min    Comment: (NOTE) The eGFR has been calculated using the CKD EPI equation. This calculation has not been validated in all clinical situations. eGFR's persistently <60 mL/min signify possible Chronic Kidney Disease. Performed at [facility], [place] [address], [place], [place] [address]   TSH     Status: None   Collection Time: 01/21/18 10:55 AM  Result Value Ref Range   TSH 0.580 0.350 - 4.500 uIU/mL    Comment: Performed by a 3rd Generation assay with a functional sensitivity of <=0.01 uIU/mL. Performed at [facility], [place] [address] [place], [place] [address]   Vitamin B12     Status: Abnormal   Collection Time: 01/21/18 10:55 AM  Result Value Ref Range   Vitamin B-12 77 (L) 180 - 914 pg/mL    Comment: (NOTE) This assay is not validated for testing neonatal or myeloproliferative syndrome specimens for Vitamin B12 levels. Performed at [facility], [place] [address] [place],  [address]   CBG monitoring, ED     Status: Abnormal   Collection Time: 01/21/18  2:46 PM  Result Value Ref Range   Glucose-Capillary 136 (H) 70 - 99 mg/dL   Ct Head Wo Contrast  Result Date: 01/21/2018 CLINICAL DATA:  Seizure-like activity.  Prostate cancer. EXAM: CT HEAD WITHOUT CONTRAST TECHNIQUE: Contiguous axial images were obtained from the base of the skull through the vertex without intravenous contrast. COMPARISON:  05/30/2017 FINDINGS:  Brain: Cerebral and cerebellar atrophy. No mass lesion, hemorrhage, hydrocephalus, acute infarct, intra-axial, or extra-axial fluid collection. Vascular: No hyperdense vessel or unexpected calcification. Skull: Normal Sinuses/Orbits: Normal imaged portions of the orbits and globes. Clear paranasal sinuses and mastoid air cells. Other: None. IMPRESSION: No acute intracranial abnormality. Electronically Signed   By: Abigail Miyamoto M.D.   On: 01/21/2018 07:31    Pending Labs Unresulted Labs (From admission, onward)    Start     Ordered   01/28/18 0500  Creatinine, serum  (enoxaparin (LOVENOX)    CrCl >/= 30 ml/min)  Weekly,   R    Comments:  while on enoxaparin therapy    01/21/18 0928   01/22/18 0500  Comprehensive metabolic panel  Tomorrow morning,   R     01/21/18 0928   01/22/18 0500  CBC  Tomorrow morning,   R     01/21/18 0928   01/21/18 0929  Folate RBC  Once,   R     01/21/18 0928          Vitals/Pain Today's Vitals   01/21/18 1100 01/21/18 1153 01/21/18 1430 01/21/18 1503  BP: (!) 157/97 (!) 172/96 (!) 149/81 (!) 159/98  Pulse: 73 72 (!) 103   Resp: (!) 26 (!) 33 (!) 21   Temp:      TempSrc:      SpO2: 95% 95% 92%   Weight:      Height:      PainSc:        Isolation Precautions No active isolations  Medications Medications  amLODipine (NORVASC) tablet 5 mg (5 mg Oral Given 01/21/18 1503)  sertraline (ZOLOFT) tablet 200 mg (200 mg Oral Given 01/21/18 1500)  traZODone (DESYREL) tablet 50 mg (50 mg Oral Given 01/20/18 2118)  acetaminophen (TYLENOL) tablet 1,000 mg (  has no administration in time range)  clonazePAM (KLONOPIN) tablet 0.5 mg (0.5 mg Oral Given 01/21/18 1503)  gabapentin (NEURONTIN) capsule 300 mg (300 mg Oral Given 01/21/18 1459)  phenylephrine (NEO-SYNEPHRINE) 1 % nasal drops 1 drop (has no administration in time range)  enoxaparin (LOVENOX) injection 40 mg (has no administration in time range)  0.9 %  sodium chloride infusion (has no administration in  time range)  acetaminophen (TYLENOL) tablet 650 mg (has no administration in time range)    Or  acetaminophen (TYLENOL) suppository 650 mg (has no administration in time range)  senna-docusate (Senokot-S) tablet 1 tablet (has no administration in time range)  bisacodyl (DULCOLAX) EC tablet 5 mg (has no administration in time range)  insulin aspart (novoLOG) injection 0-9 Units (1 Units Subcutaneous Given 01/21/18 1504)  LORazepam (ATIVAN) injection 0.5 mg (0.5 mg Intravenous Given 01/21/18 0701)  levETIRAcetam (KEPPRA) IVPB 1000 mg/100 mL premix (0 mg Intravenous Stopped 01/21/18 0846)    Mobility walks

## 2018-01-21 NOTE — Progress Notes (Addendum)
Pt is currently not an MRI candidate . Pt was brought down after pre-meds and pain meds were given prior to coming down to MRI. Pt was moved on to MRI table and immediately sat up to a sitting position and slid himself over to his bed by himself. We will be unable to obtain mri scan until his mental confusion subsides. Or pt will have to be [facility] at [facility] under [facility]ation.

## 2018-01-21 NOTE — Procedures (Signed)
ELECTROENCEPHALOGRAM REPORT   Patient: Calvin Santiago       Room #: 7681 EEG No. ID: [id number] Age: 72 y.o.        Sex: male Referring Physician: Reesa Chew Report Date:  01/21/2018        Interpreting Physician: Alexis Goodell  History: Calvin Santiago is an 72 y.o. male with altered mental status  Medications:  Norvasc, Klonopin, B12, Neurontin, Insulin, Zoloft, Desyrel  Conditions of Recording:  This is a 21 channel routine scalp EEG performed with bipolar and monopolar montages arranged in accordance to the international 10/20 system of electrode placement. One channel was dedicated to EKG recording.  The patient is in the confused and uncooperatiive state.  Description:  Artifact is prominent during the recording due to the patient's level of cooperation often obscuring the background rhythm. When able to be visualized the background is slow and poorly organized.   It consists of a low voltage, polymorphic delta rhythm that is diffusely distributed and continuous throughout the recording.   No epileptiform activity is noted.   Hyperventilation and intermittent photic stimulation were not performed.   IMPRESSION: This is an abnormal EEG secondary to general background slowing.  This finding may be seen with a diffuse disturbance that is etiologically nonspecific, but may include a metabolic encephalopathy, among other possibilities.  No epileptiform activity was noted.     Alexis Goodell, MD Neurology [phone] 01/21/2018, 7:18 PM

## 2018-01-21 NOTE — ED Provider Notes (Addendum)
6:27 AM  I was called emergently to the TCU to evaluate the patient.  Patient noted to have a seizure.  Patient was in the bathroom actively seizing.  Nursing at the bedside.  I have reviewed his chart.  No history of seizures.  Upon my arrival, patient appeared postictal with laboring respirations.  Minimally responsive.  He was transitioned to a bed and brought over to the acute care side.  I have ordered bedside glucose, repeat lab work.  I reviewed his chart.  No recent imaging.  He does have a history of prostate cancer.  Will obtain a CT scan of the head.  Neurologically, he is beginning to be more arousable.  He has no focal deficits that are obvious.  He was originally evaluated for worsening altered mental status.  Has a history of similar symptoms related to psychiatric disorder.  6:42 AM Patient now more awake.  He endorses he has a history of seizures for which he takes gabapentin.  No other seizure medications noted on his chart.  I cannot see any history that supports this in his chart.  He does have Wellbutrin and Klonopin listed.  Wellbutrin would certainly lower seizure threshold as would any benzo withdrawal.  Patient was noted to have an abrasion to his back.  Moves all 4 extremities without other significant pain.  8:20 AM Signed out to Dr. Lita Mains.  CT scan unremarkable.  Patient was given 0.5 mg Ativan and loaded with Keppra.  Neurology consult pending.  Likely will need admission for medical clearance.  8:42 AM Patient oriented to himself.  Can't provide reliable history.  Wife denies known history of seizures.  Does have history of "anxiety attacks."  She is unclear whether he has had a full work-up.  Previously was in [place].  She reports that he has had deteriorating mental status and recurrent episodes over the last 2 years.  She is on her way.  CRITICAL CARE Performed by: Merryl Hacker   Total critical care time: 40 minutes  Critical care time was exclusive  of separately billable procedures and treating other patients.  Critical care was necessary to treat or prevent imminent or life-threatening deterioration.  Critical care was time spent personally by me on the following activities: development of treatment plan with patient and/or surrogate as well as nursing, discussions with consultants, evaluation of patient's response to treatment, examination of patient, obtaining history from patient or surrogate, ordering and performing treatments and interventions, ordering and review of laboratory studies, ordering and review of radiographic studies, pulse oximetry and re-evaluation of patient's condition.    Merryl Hacker, MD 01/21/18 7017    Merryl Hacker, MD 01/21/18 [phone]

## 2018-01-21 NOTE — Progress Notes (Signed)
I spoke with Dr Cheral Marker from neurology this afternoon, recommend also getting MRI Brain without contrast.  Their team will see the patient once both EEG and MRI has resulted to give their final recommendations.  B12 levels are low, repletion ordered.  Call with questions.  Calvin Santiago

## 2018-01-21 NOTE — ED Notes (Addendum)
Pt. Documented in error CT Head w/o contrast.

## 2018-01-22 ENCOUNTER — Inpatient Hospital Stay (HOSPITAL_COMMUNITY): Payer: Medicare Other | Admitting: Certified Registered Nurse Anesthetist

## 2018-01-22 ENCOUNTER — Encounter (HOSPITAL_COMMUNITY): Payer: Self-pay

## 2018-01-22 ENCOUNTER — Ambulatory Visit (HOSPITAL_COMMUNITY)
Admit: 2018-01-22 | Discharge: 2018-01-22 | Disposition: A | Discharge status: Home / Self Care | Payer: Medicare Other | Attending: Internal Medicine | Admitting: Internal Medicine

## 2018-01-22 ENCOUNTER — Encounter (HOSPITAL_COMMUNITY)
Admission: EM | Disposition: A | Discharge status: Other Acute Inpatient Hospital | Payer: Self-pay | Source: Home / Self Care | Attending: Internal Medicine

## 2018-01-22 DIAGNOSIS — I10 Essential (primary) hypertension: Secondary | ICD-10-CM | POA: Diagnosis present

## 2018-01-22 DIAGNOSIS — Z882 Allergy status to sulfonamides status: Secondary | ICD-10-CM | POA: Diagnosis not present

## 2018-01-22 DIAGNOSIS — G92 Toxic encephalopathy: Secondary | ICD-10-CM | POA: Diagnosis present

## 2018-01-22 DIAGNOSIS — G2401 Drug induced subacute dyskinesia: Secondary | ICD-10-CM | POA: Diagnosis present

## 2018-01-22 DIAGNOSIS — F3181 Bipolar II disorder: Secondary | ICD-10-CM | POA: Diagnosis present

## 2018-01-22 DIAGNOSIS — Y93K1 Activity, walking an animal: Secondary | ICD-10-CM | POA: Diagnosis not present

## 2018-01-22 DIAGNOSIS — F430 Acute stress reaction: Secondary | ICD-10-CM | POA: Diagnosis present

## 2018-01-22 DIAGNOSIS — F0390 Unspecified dementia without behavioral disturbance: Secondary | ICD-10-CM | POA: Diagnosis present

## 2018-01-22 DIAGNOSIS — D519 Vitamin B12 deficiency anemia, unspecified: Secondary | ICD-10-CM | POA: Diagnosis not present

## 2018-01-22 DIAGNOSIS — G40409 Other generalized epilepsy and epileptic syndromes, not intractable, without status epilepticus: Secondary | ICD-10-CM | POA: Diagnosis present

## 2018-01-22 DIAGNOSIS — F332 Major depressive disorder, recurrent severe without psychotic features: Secondary | ICD-10-CM | POA: Diagnosis not present

## 2018-01-22 DIAGNOSIS — E512 Wernicke's encephalopathy: Secondary | ICD-10-CM | POA: Diagnosis present

## 2018-01-22 DIAGNOSIS — R569 Unspecified convulsions: Secondary | ICD-10-CM | POA: Diagnosis not present

## 2018-01-22 DIAGNOSIS — W19XXXA Unspecified fall, initial encounter: Secondary | ICD-10-CM | POA: Diagnosis present

## 2018-01-22 DIAGNOSIS — F410 Panic disorder [episodic paroxysmal anxiety] without agoraphobia: Secondary | ICD-10-CM | POA: Diagnosis present

## 2018-01-22 DIAGNOSIS — M19072 Primary osteoarthritis, left ankle and foot: Secondary | ICD-10-CM | POA: Diagnosis not present

## 2018-01-22 DIAGNOSIS — D075 Carcinoma in situ of prostate: Secondary | ICD-10-CM | POA: Diagnosis not present

## 2018-01-22 DIAGNOSIS — Z96653 Presence of artificial knee joint, bilateral: Secondary | ICD-10-CM | POA: Diagnosis present

## 2018-01-22 DIAGNOSIS — Z79899 Other long term (current) drug therapy: Secondary | ICD-10-CM | POA: Diagnosis not present

## 2018-01-22 DIAGNOSIS — S2232XA Fracture of one rib, left side, initial encounter for closed fracture: Secondary | ICD-10-CM | POA: Diagnosis present

## 2018-01-22 DIAGNOSIS — R32 Unspecified urinary incontinence: Secondary | ICD-10-CM | POA: Diagnosis present

## 2018-01-22 DIAGNOSIS — C61 Malignant neoplasm of prostate: Secondary | ICD-10-CM | POA: Diagnosis present

## 2018-01-22 DIAGNOSIS — E538 Deficiency of other specified B group vitamins: Secondary | ICD-10-CM | POA: Diagnosis present

## 2018-01-22 DIAGNOSIS — F419 Anxiety disorder, unspecified: Secondary | ICD-10-CM | POA: Diagnosis present

## 2018-01-22 DIAGNOSIS — Z6836 Body mass index (BMI) 36.0-36.9, adult: Secondary | ICD-10-CM | POA: Diagnosis not present

## 2018-01-22 DIAGNOSIS — Z9114 Patient's other noncompliance with medication regimen: Secondary | ICD-10-CM | POA: Diagnosis not present

## 2018-01-22 DIAGNOSIS — M25472 Effusion, left ankle: Secondary | ICD-10-CM | POA: Diagnosis not present

## 2018-01-22 DIAGNOSIS — R4182 Altered mental status, unspecified: Secondary | ICD-10-CM | POA: Diagnosis not present

## 2018-01-22 DIAGNOSIS — F4310 Post-traumatic stress disorder, unspecified: Secondary | ICD-10-CM | POA: Diagnosis present

## 2018-01-22 DIAGNOSIS — M109 Gout, unspecified: Secondary | ICD-10-CM | POA: Diagnosis present

## 2018-01-22 HISTORY — PX: RADIOLOGY WITH ANESTHESIA: SHX6223

## 2018-01-22 LAB — CBC
HCT: 43.1 % (ref 39.0–52.0)
HEMOGLOBIN: 14 g/dL (ref 13.0–17.0)
MCH: 28.4 pg (ref 26.0–34.0)
MCHC: 32.5 g/dL (ref 30.0–36.0)
MCV: 87.4 fL (ref 80.0–100.0)
NRBC: 0 % (ref 0.0–0.2)
Platelets: 224 10*3/uL (ref 150–400)
RBC: 4.93 MIL/uL (ref 4.22–5.81)
RDW: 14 % (ref 11.5–15.5)
WBC: 10.2 10*3/uL (ref 4.0–10.5)

## 2018-01-22 LAB — COMPREHENSIVE METABOLIC PANEL
ALT: 18 U/L (ref 0–44)
ANION GAP: 11 (ref 5–15)
AST: 23 U/L (ref 15–41)
Albumin: 4.1 g/dL (ref 3.5–5.0)
Alkaline Phosphatase: 69 U/L (ref 38–126)
BUN: 21 mg/dL (ref 8–23)
CO2: 24 mmol/L (ref 22–32)
Calcium: 8.8 mg/dL — ABNORMAL LOW (ref 8.9–10.3)
Chloride: 106 mmol/L (ref 98–111)
Creatinine, Ser: 0.7 mg/dL (ref 0.61–1.24)
GFR calc Af Amer: >60 mL/min (ref >60)
Glucose, Bld: 112 mg/dL — ABNORMAL HIGH (ref 70–99)
POTASSIUM: 3.6 mmol/L (ref 3.5–5.1)
Sodium: 141 mmol/L (ref 135–145)
TOTAL PROTEIN: 7.5 g/dL (ref 6.5–8.1)
Total Bilirubin: 0.6 mg/dL (ref 0.3–1.2)

## 2018-01-22 LAB — FOLATE RBC
FOLATE, HEMOLYSATE: 344.8 ng/mL
FOLATE, RBC: 735 ng/mL (ref >498)
HEMATOCRIT: 46.9 % (ref 37.5–51.0)

## 2018-01-22 LAB — GLUCOSE, CAPILLARY
GLUCOSE-CAPILLARY: 106 mg/dL — AB (ref 70–99)
GLUCOSE-CAPILLARY: 117 mg/dL — AB (ref 70–99)
GLUCOSE-CAPILLARY: 95 mg/dL (ref 70–99)
Glucose-Capillary: 101 mg/dL — ABNORMAL HIGH (ref 70–99)

## 2018-01-22 LAB — RPR: RPR Ser Ql: NONREACTIVE

## 2018-01-22 SURGERY — MRI WITH ANESTHESIA
Anesthesia: General

## 2018-01-22 MED ORDER — VITAMIN B-12 100 MCG PO TABS
100.0000 ug | ORAL_TABLET | Freq: Every day | ORAL | Status: DC
  Administered 2018-01-22 – 2018-01-27 (×6): 100 ug via ORAL
  Filled 2018-01-22 (×6): qty 1

## 2018-01-22 NOTE — Care Management Obs Status (Signed)
[facility] NOTIFICATION   Patient Details  Name: Calvin Santiago MRN: [id number] Date of Birth: [birth year]   Medicare Observation Status Notification Given:  Yes    Purcell Mouton, RN 01/22/2018, 3:00 PM

## 2018-01-22 NOTE — Progress Notes (Signed)
PROGRESS NOTE    Calvin Santiago  WEX:[id number] DOB: Nov 24, 1945 DOA: 01/20/2018 PCP: London Pepper, MD   Brief Narrative: Patient is a 72 year old male with past medical history of severe recurrent major depressive disorder, adenocarcinoma of the prostate, panic attack, PTSD who was brought to the hospital for the evaluation of change in mental status at home as recommended by his psychiatrist.  He follows with Dr. Darleene Cleaver who suggested  him to go to the emergency department for further evaluation of bizarre behavior.  He was noticed to have change in the usual behavior after he visited her mom who has terminal cancer.  He had similar presentation about a year ago with similar situation.  Patient was being planned for admission to the psychiatric unit when he was noticed to have seizure episode.  Then he was admitted under medical service.  Neurology is following.  Started on antiseizure medications.  Plan for MRI of the brain on sedation.  Assessment & Plan:   Principal Problem:   Altered mental status Active Problems:   Panic disorder (episodic paroxysmal anxiety)   Major depressive disorder, recurrent episode, severe (HCC)   Malignant neoplasm of prostate ([place])   Seizure ([place])   Acute metabolic encephalopathy  Altered mental status: Patient is still altered, confused and disoriented.  Easily distractible.  Could not pinpoint the exact etiology of altered mental status but it could be issues with his anxiety/depression.  Continue current medications.  Plan for MRI today. Ammonia level normal.  Follow-up thiamine, RPR.  Also started on thiamine.  Suspected seizure disorder: As per the records, he was noticed to have generalized tonic-clonic seizure in the emergency department.  He never had seizure in the past.  No history of seizure disorder.  Not on any seizure medication.  Neurology consulted.  Loaded with valproate.  Continue valproate for now.  EEG did not show any clear epileptiform  activities but was concern for encephalopathy.  Continue seizure precaution.  Vitamin B12 deficiency: Started on supplementation.  Severe recurrent depression/anxiety: Initially planned to be admitted to psychiatry unit.  Psychiatry will be following.  He will need psychiatric admission after clearance from medicine and surgery.  Continue current medications.  His altered mental status could be also from psychosis from severe depression.  Adenocarcinoma of the prostate: Stage T1c.  Currently undergoing radiotherapy at the cancer center  Hypertension: Currently above stable.  Continue amlodipine.     DVT prophylaxis:Lovenox Code Status: Full Family Communication: Discuss extensively with wife on phone Disposition Plan: Needs to be reminded   Consultants: Neurology, psychiatry  Procedures: None  Antimicrobials: None  Subjective: Patient seen and examined at the bedside this morning.  He is hemodynamically stable.  He is completely disoriented and acts bizarrely.  Looked comfortable during my evaluation  Objective: Vitals:   01/21/18 1503 01/21/18 1626 01/21/18 2128 01/22/18 0517  BP: (!) 159/98 (!) 153/87 138/90 (!) 156/98  Pulse:  80 85 85  Resp:  (!) 22 20 16   Temp:  98 F (36.7 C) 98.7 F (37.1 C) 99.6 F (37.6 C)  TempSrc:  Oral Oral Oral  SpO2:  94% 94% 95%  Weight:    122.3 kg  Height:        Intake/Output Summary (Last 24 hours) at 01/22/2018 1259 Last data filed at 01/22/2018 0838 Gross per 24 hour  Intake 1186.94 ml  Output -  Net 1186.94 ml   Filed Weights   01/20/18 1224 01/22/18 0517  Weight: 125.6 kg 122.3 kg  Examination:  General exam: Not in distress, obese HEENT:PERRL,Oral mucosa moist, Ear/Nose normal on gross exam Respiratory system: Bilateral equal air entry, normal vesicular breath sounds, no wheezes or crackles  Cardiovascular system: S1 & S2 heard, RRR. No JVD, murmurs, rubs, gallops or clicks. No pedal edema. Gastrointestinal system:  Abdomen is nondistended, soft and nontender. No organomegaly or masses felt. Normal bowel sounds heard. Central nervous system: Alert but not oriented. No focal neurological deficits. Extremities: No edema, no clubbing ,no cyanosis, distal peripheral pulses palpable. Skin: No rashes, lesions or ulcers,no icterus ,no pallor MSK: Normal muscle bulk,tone ,power Psychiatry: Judgement and insight appear impaired.  Very distractible.  Confused.  Talks inappropriately   Data Reviewed: I have personally reviewed following labs and imaging studies  CBC: Recent Labs  Lab 01/20/18 1357 01/21/18 0635 01/21/18 0644 01/21/18 1055 01/22/18 0425  WBC 8.0 13.4*  --  10.4 10.2  NEUTROABS  --  10.1*  --   --   --   HGB 13.9 15.5 15.0 13.6 14.0  HCT 43.6 50.2 44.0 42.3 43.1  MCV 88.3 93.1  --  89.4 87.4  PLT 213 285  --  240 638   Basic Metabolic Panel: Recent Labs  Lab 01/20/18 1357 01/21/18 0635 01/21/18 0644 01/21/18 1055 01/22/18 0425  NA 142 139 140  --  141  K 3.8 3.4* 3.8  --  3.6  CL 105 100 105  --  106  CO2 24 16*  --   --  24  GLUCOSE 88 158* 153*  --  112*  BUN 21 23 28*  --  21  CREATININE 1.04 1.07 0.90 0.79 0.70  CALCIUM 9.3 9.8  --   --  8.8*   GFR: Estimated Creatinine Clearance: 113.5 mL/min (by C-G formula based on SCr of 0.7 mg/dL). Liver Function Tests: Recent Labs  Lab 01/20/18 1357 01/22/18 0425  AST 25 23  ALT 19 18  ALKPHOS 69 69  BILITOT 1.5* 0.6  PROT 7.7 7.5  ALBUMIN 4.5 4.1   No results for input(s): LIPASE, AMYLASE in the last 168 hours. Recent Labs  Lab 01/21/18 2253  AMMONIA 31   Coagulation Profile: No results for input(s): INR, PROTIME in the last 168 hours. Cardiac Enzymes: No results for input(s): CKTOTAL, CKMB, CKMBINDEX, TROPONINI in the last 168 hours. BNP (last 3 results) No results for input(s): PROBNP in the last 8760 hours. HbA1C: No results for input(s): HGBA1C in the last 72 hours. CBG: Recent Labs  Lab 01/21/18 1446  01/21/18 1723 01/21/18 2352 01/22/18 0731 01/22/18 1204  GLUCAP 136* 100* 101* 106* 117*   Lipid Profile: No results for input(s): CHOL, HDL, LDLCALC, TRIG, CHOLHDL, LDLDIRECT in the last 72 hours. Thyroid Function Tests: Recent Labs    01/21/18 1055  TSH 0.580   Anemia Panel: Recent Labs    01/21/18 1055  VITAMINB12 77*   Sepsis Labs: No results for input(s): PROCALCITON, LATICACIDVEN in the last 168 hours.  No results found for this or any previous visit (from the past 240 hour(s)).       Radiology Studies: Ct Head Wo Contrast  Result Date: 01/21/2018 CLINICAL DATA:  Seizure-like activity.  Prostate cancer. EXAM: CT HEAD WITHOUT CONTRAST TECHNIQUE: Contiguous axial images were obtained from the base of the skull through the vertex without intravenous contrast. COMPARISON:  05/30/2017 FINDINGS: Brain: Cerebral and cerebellar atrophy. No mass lesion, hemorrhage, hydrocephalus, acute infarct, intra-axial, or extra-axial fluid collection. Vascular: No hyperdense vessel or unexpected calcification. Skull: Normal Sinuses/Orbits:  Normal imaged portions of the orbits and globes. Clear paranasal sinuses and mastoid air cells. Other: None. IMPRESSION: No acute intracranial abnormality. Electronically Signed   By: Abigail Miyamoto M.D.   On: 01/21/2018 07:31        Scheduled Meds: . amLODipine  5 mg Oral Daily  . clonazePAM  0.5 mg Oral BID  . enoxaparin (LOVENOX) injection  40 mg Subcutaneous Q24H  . gabapentin  300 mg Oral BID  . insulin aspart  0-9 Units Subcutaneous TID WC  . sertraline  200 mg Oral Daily  . traZODone  50 mg Oral QHS  . vitamin B-12  100 mcg Oral Daily   Continuous Infusions: . sodium chloride 100 mL/hr at 01/22/18 0952  . thiamine injection Stopped (01/22/18 0656)  . valproate sodium 500 mg (01/22/18 1004)     LOS: 0 days    Time spent: 35 mins.More than 50% of that time was spent in counseling and/or coordination of care.      Shelly Coss, MD [facility] Pager [phone]  If 7PM-7AM, please contact night-coverage [email or web address] Password [facility] 01/22/2018, 12:59 PM

## 2018-01-22 NOTE — Anesthesia Procedure Notes (Signed)
Procedure Name: Intubation Date/Time: 01/22/2018 4:20 PM Performed by: Trinna Post., CRNA Pre-anesthesia Checklist: Patient identified, Emergency Drugs available, Suction available, Patient being monitored and Timeout performed Patient Re-evaluated:Patient Re-evaluated prior to induction Oxygen Delivery Method: Circle system utilized Preoxygenation: Pre-oxygenation with 100% oxygen Induction Type: IV induction, Rapid sequence and Cricoid Pressure applied Laryngoscope Size: Mac and 4 Grade View: Grade I Tube type: Oral Tube size: 7.5 mm Airway Equipment and Method: Stylet Placement Confirmation: ETT inserted through vocal cords under direct vision,  positive ETCO2 and breath sounds checked- equal and bilateral Secured at: 23 cm Tube secured with: Tape Dental Injury: Teeth and Oropharynx as per pre-operative assessment

## 2018-01-22 NOTE — Anesthesia Preprocedure Evaluation (Signed)
Anesthesia Evaluation  Patient identified by MRN, date of birth, ID band Patient confused    Reviewed: Allergy & Precautions, NPO status , Patient's Chart, lab work & pertinent test results  History of Anesthesia Complications Negative for: history of anesthetic complications  Airway Mallampati: III  TM Distance: >3 FB Neck ROM: Full    Dental   Pulmonary neg pulmonary ROS,    breath sounds clear to auscultation       Cardiovascular  Rhythm:Regular     Neuro/Psych Seizures -,  PSYCHIATRIC DISORDERS Anxiety Depression Bipolar Disorder    GI/Hepatic negative GI ROS, Neg liver ROS,   Endo/Other  Morbid obesity  Renal/GU negative Renal ROSProstate Ca     Musculoskeletal negative musculoskeletal ROS (+)   Abdominal   Peds  Hematology negative hematology ROS (+)   Anesthesia Other Findings   Reproductive/Obstetrics                             Anesthesia Physical Anesthesia Plan  ASA: III  Anesthesia Plan: General   Post-op Pain Management:    Induction: Intravenous, Rapid sequence and Cricoid pressure planned  PONV Risk Score and Plan: 2 and Ondansetron and Dexamethasone  Airway Management Planned: Oral ETT  Additional Equipment: None  Intra-op Plan:   Post-operative Plan: Possible Post-op intubation/ventilation and Extubation in OR  Informed Consent:   Consent reviewed with POA  Plan Discussed with: CRNA and Surgeon  Anesthesia Plan Comments:         Anesthesia Quick Evaluation

## 2018-01-22 NOTE — Transfer of Care (Signed)
Immediate Anesthesia Transfer of Care Note  Patient: Calvin Santiago  Procedure(s) Performed: MRI of the brain WITH ANESTHESIA (N/A )  Patient Location: PACU  Anesthesia Type:General  Level of Consciousness: awake, alert  and confused  Airway & Oxygen Therapy: Patient Spontanous Breathing and Patient connected to face mask oxygen  Post-op Assessment: Report given to RN and Post -op Vital signs reviewed and stable  Post vital signs: Reviewed and stable  Last Vitals:  Vitals Value Taken Time  BP 140/89 01/22/2018  5:27 PM  Temp    Pulse 96 01/22/2018  5:33 PM  Resp 22 01/22/2018  5:33 PM  SpO2 93 % 01/22/2018  5:33 PM  Vitals shown include unvalidated device data.  Last Pain:  Vitals:   01/22/18 1425  TempSrc: Oral  PainSc:          Complications: No apparent anesthesia complications

## 2018-01-23 ENCOUNTER — Encounter (HOSPITAL_COMMUNITY): Payer: Self-pay | Admitting: Radiology

## 2018-01-23 LAB — GLUCOSE, CAPILLARY
Glucose-Capillary: 92 mg/dL (ref 70–99)
Glucose-Capillary: 100 mg/dL — ABNORMAL HIGH (ref 70–99)
Glucose-Capillary: 92 mg/dL (ref 70–99)

## 2018-01-23 MED ORDER — DIVALPROEX SODIUM 125 MG PO CSDR
500.0000 mg | DELAYED_RELEASE_CAPSULE | Freq: Two times a day (BID) | ORAL | Status: DC
  Administered 2018-01-23 – 2018-01-27 (×8): 500 mg via ORAL
  Filled 2018-01-23 (×8): qty 4

## 2018-01-23 MED FILL — Rocuronium Bromide IV Soln 50 MG/5ML (10 MG/ML): INTRAVENOUS | Qty: 5 | Status: AC

## 2018-01-23 MED FILL — Sugammadex Sodium IV 200 MG/2ML (Base Equivalent): INTRAVENOUS | Qty: 2 | Status: AC

## 2018-01-23 MED FILL — Ephedrine Sulf-NaCl Soln Pref Syr 50 MG/10ML-0.9% (5 MG/ML): INTRAVENOUS | Qty: 10 | Status: AC

## 2018-01-23 MED FILL — Lactated Ringer's Solution: INTRAVENOUS | Qty: 1000 | Status: AC

## 2018-01-23 MED FILL — Succinylcholine Chloride Sol Pref Syr 200 MG/10ML (20 MG/ML): INTRAVENOUS | Qty: 10 | Status: AC

## 2018-01-23 MED FILL — Lidocaine HCl(Cardiac) IV PF Soln Pref Syr 100 MG/5ML (2%): INTRAVENOUS | Qty: 5 | Status: AC

## 2018-01-23 MED FILL — Propofol IV Emul 200 MG/20ML (10 MG/ML): INTRAVENOUS | Qty: 20 | Status: AC

## 2018-01-23 MED FILL — Ondansetron HCl Inj 4 MG/2ML (2 MG/ML): INTRAMUSCULAR | Qty: 2 | Status: AC

## 2018-01-23 NOTE — Progress Notes (Signed)
PROGRESS NOTE    Calvin Santiago  QQV:[id number] DOB: 1946/01/20 DOA: 01/20/2018 PCP: London Pepper, MD   Brief Narrative: Patient is a 72 year old male with past medical history of severe recurrent major depressive disorder, adenocarcinoma of the prostate, panic attack, PTSD who was brought to the hospital for the evaluation of change in mental status at home as recommended by his psychiatrist.  He follows with Dr. Darleene Cleaver who suggested  him to go to the emergency department for further evaluation of bizarre behavior.  He was noticed to have change in the usual behavior after he visited her mom who has terminal cancer.  He had similar presentation about a year ago with similar situation.  Patient was being planned for admission to the psychiatric unit when he was noticed to have seizure episode.  Then he was admitted under medical service.  Neurology /psychiatry were following.  Started on antiseizure medications.   MRI of the brain did not show any acute intracranial abnormalities.  Neurology signed off.  Plan is to transfer him to inpatient psych as soon as the bed is available.  Social worker consulted.  Assessment & Plan:   Principal Problem:   Altered mental status Active Problems:   Panic disorder (episodic paroxysmal anxiety)   Major depressive disorder, recurrent episode, severe (HCC)   Malignant neoplasm of prostate (HCC)   Seizure ([place])   Acute metabolic encephalopathy  Altered mental status:Alert and oriented today.Back to his baseline.  Continue current medications.  MRI did not show any acute intracranial normalities.  Suspected seizure disorder: As per the records, he was noticed to have generalized tonic-clonic seizure in the emergency department.  He never had seizure in the past.  No history of seizure disorder.  Not on any seizure medication.  Neurology was consulted.  Loaded with valproate.  Continue oral valproate for now.  EEG did not show any clear epileptiform activities  but was concern for encephalopathy.  He will follow-up with neurology as an outpatient.  Recommended to get a Depakote level in 2 weeks.  Vitamin B12 deficiency: Started on supplementation.  Severe recurrent depression/anxiety: Plan is to admit him to psych now.  Psychiatry will be following. Continue current medications.  His altered mental status could be also from psychosis from severe depression.  Adenocarcinoma of the prostate: Stage T1c.  Currently undergoing radiotherapy at the cancer center  Hypertension: Currently above stable.  Continue amlodipine.     DVT prophylaxis:Lovenox Code Status: Full Family Communication: Discuss extensively with wife face-to-face  disposition Plan: Inpatient psych as soon as the  bed is available.  Social worker following   Consultants: Neurology, psychiatry  Procedures: None  Antimicrobials: None  Subjective: Patient seen and examined at the bedside this morning.  He is hemodynamically stable.  Looked comfortable during my evaluation.  He is alert and oriented this morning.  Objective: Vitals:   01/22/18 1900 01/22/18 1945 01/22/18 2024 01/23/18 0538  BP: 131/67 138/68 (!) 146/86 138/80  Pulse: 97 (!) 105 (!) 104 84  Resp: (!) 33 (!) 22 18 18   Temp:  98.1 F (36.7 C) 99.6 F (37.6 C) 98.7 F (37.1 C)  TempSrc:   Oral Oral  SpO2: 95% 93% 93% 93%  Weight:      Height:        Intake/Output Summary (Last 24 hours) at 01/23/2018 1431 Last data filed at 01/23/2018 1300 Gross per 24 hour  Intake 783.27 ml  Output 100 ml  Net 683.27 ml   [employer]  01/20/18 1224 01/22/18 0517  Weight: 125.6 kg 122.3 kg    Examination:  General exam: Not in distress, obese HEENT:PERRL,Oral mucosa moist, Ear/Nose normal on gross exam Respiratory system: Bilateral equal air entry, normal vesicular breath sounds, no wheezes or crackles  Cardiovascular system: S1 & S2 heard, RRR. No JVD, murmurs, rubs, gallops or clicks. No pedal  edema. Gastrointestinal system: Abdomen is nondistended, soft and nontender. No organomegaly or masses felt. Normal bowel sounds heard. Central nervous system: Alert and oriented. No focal neurological deficits. Extremities: No edema, no clubbing ,no cyanosis, distal peripheral pulses palpable. Skin: No rashes, lesions or ulcers,no icterus ,no pallor MSK: Normal muscle bulk,tone ,power   Data Reviewed: I have personally reviewed following labs and imaging studies  CBC: Recent Labs  Lab 01/20/18 1357 01/21/18 0635 01/21/18 0644 01/21/18 1055 01/22/18 0425  WBC 8.0 13.4*  --  10.4 10.2  NEUTROABS  --  10.1*  --   --   --   HGB 13.9 15.5 15.0 13.6 14.0  HCT 43.6 50.2 44.0 42.3  46.9 43.1  MCV 88.3 93.1  --  89.4 87.4  PLT 213 285  --  240 623   Basic Metabolic Panel: Recent Labs  Lab 01/20/18 1357 01/21/18 0635 01/21/18 0644 01/21/18 1055 01/22/18 0425  NA 142 139 140  --  141  K 3.8 3.4* 3.8  --  3.6  CL 105 100 105  --  106  CO2 24 16*  --   --  24  GLUCOSE 88 158* 153*  --  112*  BUN 21 23 28*  --  21  CREATININE 1.04 1.07 0.90 0.79 0.70  CALCIUM 9.3 9.8  --   --  8.8*   GFR: Estimated Creatinine Clearance: 113.5 mL/min (by C-G formula based on SCr of 0.7 mg/dL). Liver Function Tests: Recent Labs  Lab 01/20/18 1357 01/22/18 0425  AST 25 23  ALT 19 18  ALKPHOS 69 69  BILITOT 1.5* 0.6  PROT 7.7 7.5  ALBUMIN 4.5 4.1   No results for input(s): LIPASE, AMYLASE in the last 168 hours. Recent Labs  Lab 01/21/18 2253  AMMONIA 31   Coagulation Profile: No results for input(s): INR, PROTIME in the last 168 hours. Cardiac Enzymes: No results for input(s): CKTOTAL, CKMB, CKMBINDEX, TROPONINI in the last 168 hours. BNP (last 3 results) No results for input(s): PROBNP in the last 8760 hours. HbA1C: No results for input(s): HGBA1C in the last 72 hours. CBG: Recent Labs  Lab 01/22/18 0731 01/22/18 1204 01/22/18 2026 01/23/18 0813 01/23/18 1150  GLUCAP  106* 117* 95 92 100*   Lipid Profile: No results for input(s): CHOL, HDL, LDLCALC, TRIG, CHOLHDL, LDLDIRECT in the last 72 hours. Thyroid Function Tests: Recent Labs    01/21/18 1055  TSH 0.580   Anemia Panel: Recent Labs    01/21/18 1055  VITAMINB12 77*   Sepsis Labs: No results for input(s): PROCALCITON, LATICACIDVEN in the last 168 hours.  No results found for this or any previous visit (from the past 240 hour(s)).       Radiology Studies: Mr Brain Wo Contrast  Result Date: 01/22/2018 CLINICAL DATA:  Altered mental status. History of prostate cancer and severe recurrent depression. EXAM: MRI HEAD WITHOUT CONTRAST TECHNIQUE: Multiplanar, multiecho pulse sequences of the brain and surrounding structures were obtained without intravenous contrast. COMPARISON:  CT HEAD January 21, 2018 FINDINGS: INTRACRANIAL CONTENTS: No reduced diffusion to suggest acute ischemia. No susceptibility artifact to suggest hemorrhage. The ventricles and sulci  are normal for patient's age. No suspicious parenchymal signal, masses, mass effect. No abnormal extra-axial fluid collections. No extra-axial masses. VASCULAR: Normal major intracranial vascular flow voids present at skull base. SKULL AND UPPER CERVICAL SPINE: No abnormal sellar expansion. No suspicious calvarial bone marrow signal. Craniocervical junction maintained. SINUSES/ORBITS: The mastoid air-cells and included paranasal sinuses are well-aerated.The included ocular globes and orbital contents are non-suspicious. OTHER: Life support lines in place with layering secretions in pharynx. IMPRESSION: Normal noncontrast MRI head for age. Electronically Signed   By: Elon Alas M.D.   On: 01/22/2018 17:42        Scheduled Meds: . amLODipine  5 mg Oral Daily  . clonazePAM  0.5 mg Oral BID  . divalproex  500 mg Oral Q12H  . enoxaparin (LOVENOX) injection  40 mg Subcutaneous Q24H  . gabapentin  300 mg Oral BID  . insulin aspart  0-9  Units Subcutaneous TID WC  . sertraline  200 mg Oral Daily  . vitamin B-12  100 mcg Oral Daily   Continuous Infusions:    LOS: 1 day    Time spent: 35 mins.More than 50% of that time was spent in counseling and/or coordination of care.      Shelly Coss, MD [facility] Pager [phone]  If 7PM-7AM, please contact night-coverage [email or web address] Password [facility] 01/23/2018, 2:31 PM

## 2018-01-23 NOTE — Progress Notes (Addendum)
Subjective: Patient has no complaints at this time.  He is stating that he has a depression syndrome.  He has not been taking his meds.  Then he tangentially goes into how his therapist had told him never to take off his glasses.  As he can must keep his glasses on to keep his from getting depressed.  Exam: Vitals:   01/22/18 2024 01/23/18 0538  BP: (!) 146/86 138/80  Pulse: (!) 104 84  Resp: 18 18  Temp: 99.6 F (37.6 C) 98.7 F (37.1 C)  SpO2: 93% 93%    Physical Exam   HEENT-  Normocephalic, no lesions, without obvious abnormality.  Normal external eye and conjunctiva.   Extremities- Warm, dry and intact Musculoskeletal-no joint tenderness, deformity or swelling Skin-warm and dry, no hyperpigmentation, vitiligo, or suspicious lesions    Neuro:  Mental Status: Alert, oriented, initially was talking coherently and then tangential that he went into why he needs to wear his glasses to keep him from becoming depressed.  He also states that he did not take his medications and his wife brings him to home in the hospital.  He is able to name objects such as a pen, watch, badge.  Speech fluent without evidence of aphasia.  Able to follow 3 step commands without difficulty-he was able to take his thumb, touch his nose, and point to the ceiling. Cranial Nerves: II:  Visual fields grossly normal,  III,IV, VI: ptosis not present, extra-ocular motions intact bilaterally pupils equal, round, reactive to light and accommodation V,VII: smile symmetric, facial light touch sensation normal bilaterally VIII: hearing normal bilaterally IX,X: uvula rises midline XI: bilateral shoulder shrug XII: midline tongue extension Motor: Right : Upper extremity   5/5    Left:     Upper extremity   5/5  Lower extremity   5/5     Lower extremity   5/5 Tone and bulk:normal tone throughout; no atrophy noted Sensory: Pinprick and light touch intact throughout, bilaterally Deep Tendon Reflexes: 2+ and symmetric  throughout however initially he is very agitated will allow me to do his deep tendon reflexes Plantars: Right: downgoing   Left: downgoing      Medications:  Prior to Admission:  Medications Prior to Admission  Medication Sig Dispense Refill Last Dose  . acetaminophen (TYLENOL) 500 MG tablet Take 1,000 mg by mouth every 6 (six) hours as needed for moderate pain.   01/19/2018 at Unknown time  . amLODipine (NORVASC) 5 MG tablet Take 5 mg by mouth daily.   01/19/2018 at Unknown time  . buPROPion (WELLBUTRIN XL) 150 MG 24 hr tablet Take 150 mg by mouth daily.   01/20/2018 at Unknown time  . clonazePAM (KLONOPIN) 0.5 MG tablet Take 0.5 mg by mouth 2 (two) times daily.    01/20/2018 at Unknown time  . gabapentin (NEURONTIN) 300 MG capsule Take 300 mg by mouth 2 (two) times daily.    01/20/2018 at Unknown time  . phenylephrine (NEO-SYNEPHRINE) 1 % nasal spray Place 1 drop into both nostrils every 6 (six) hours as needed for congestion.   01/19/2018 at Unknown time  . sertraline (ZOLOFT) 100 MG tablet Take 200 mg by mouth daily.    01/20/2018 at Unknown time  . traZODone (DESYREL) 50 MG tablet Take 50 mg by mouth at bedtime.   01/19/2018 at Unknown time   Scheduled: . amLODipine  5 mg Oral Daily  . clonazePAM  0.5 mg Oral BID  . enoxaparin (LOVENOX) injection  40 mg Subcutaneous  Q24H  . gabapentin  300 mg Oral BID  . insulin aspart  0-9 Units Subcutaneous TID WC  . sertraline  200 mg Oral Daily  . traZODone  50 mg Oral QHS  . vitamin B-12  100 mcg Oral Daily   Continuous: . thiamine injection 500 mg (01/23/18 0718)  . valproate sodium 500 mg (01/23/18 0145)    Pertinent Labs/Diagnostics:   Mr Brain Wo Contrast  Result Date: 01/22/2018 CLINICAL DATA:  Altered mental status. History of prostate cancer and severe recurrent depression. EXAM: MRI HEAD WITHOUT CONTRAST TECHNIQUE: Multiplanar, multiecho pulse sequences of the brain and surrounding structures were obtained without  intravenous contrast. COMPARISON:  CT HEAD January 21, 2018 FINDINGS: INTRACRANIAL CONTENTS: No reduced diffusion to suggest acute ischemia. No susceptibility artifact to suggest hemorrhage. The ventricles and sulci are normal for patient's age. No suspicious parenchymal signal, masses, mass effect. No abnormal extra-axial fluid collections. No extra-axial masses. VASCULAR: Normal major intracranial vascular flow voids present at skull base. SKULL AND UPPER CERVICAL SPINE: No abnormal sellar expansion. No suspicious calvarial bone marrow signal. Craniocervical junction maintained. SINUSES/ORBITS: The mastoid air-cells and included paranasal sinuses are well-aerated.The included ocular globes and orbital contents are non-suspicious. OTHER: Life support lines in place with layering secretions in pharynx. IMPRESSION: Normal noncontrast MRI head for age. Electronically Signed   By: Elon Alas M.D.   On: 01/22/2018 17:42     Etta Quill PA-C [facility] [phone]   Assessment: 72 year old male presenting to hospital with altered mental status.  Suspected that he has had a seizure thus was placed on valproic acid which both is a seizure medication and also a mood stabilizing medication.  EEG did not show any seizure activity.  MRI was obtained and did not show any abnormalities.   Recommendations: -At this time would continue Depakote 500 BID PO and get Depakote level in approximately 2 weeks - As MRI and EEG did not show any abnormalities no further work-up from neurology is warranted. --agree with discontinuing Wellbutrin as this can lower seizure threshold.  --Neurology will S/O    01/23/2018, 11:37 AM

## 2018-01-23 NOTE — Progress Notes (Signed)
CSW following for inpatient psych placement. Referral has been made to [facility] who will f/u when bed is available.     Pricilla Holm, MSW, [employer] Clinical Social Work [phone] (Weekend)

## 2018-01-23 NOTE — Consult Note (Signed)
[facility] Psych Consult Progress Note  01/23/2018 1:18 PM Calvin Santiago  MRN:  [id number] Subjective:   Calvin Santiago was last seen on 11/20 when he presented to the ED with altered mental status and bizarre behavior. He was admitted to the medicine service for seizure. EEG was remarkable for generalized background slowing. He was placed on Depakote 500 mg BID. Vitamin B12 level was 77 (low) so he is receiving supplementation. He completed IV thiamine supplementation. His workup has been otherwise unremarkable including head CT and MRI. He continues to exhibit bizarre behavior.  On interview, Calvin Santiago reports that he is feeling much better. He reports that he was feeling overwhelmed due to multiple stressors including the recent visit with his mother. He slept well overnight for the first time in several nights. He complains of left sided pain due to falling while walking his dog. He reports an improvement in his appetite. He denies SI, HI or AVH. He is oriented to person, place and time. He provides verbal consent to speak to his wife. She was contacted by phone. She reports that his mood has been labile. He was angry this morning and there was not an identifiable stressor. He continues to have periods where he is not himself which is worst than last year when he was hospitalized. She does not feel like he is safe to return home and would like him to go to a psychiatric hospital for further stabilization.   Principal Problem: Major depressive disorder, recurrent episode, severe (HCC) Diagnosis:  Principal Problem:   Altered mental status Active Problems:   Panic disorder (episodic paroxysmal anxiety)   Major depressive disorder, recurrent episode, severe (HCC)   Malignant neoplasm of prostate ([place])   Seizure ([place])   Acute metabolic encephalopathy  Total Time spent with patient: 15 minutes  Past Psychiatric History: MDD and anxiety with panic attacks.   Past Medical History:  Past Medical History:   Diagnosis Date  . Anxiety   . Bipolar 2 disorder ([place])   . Prostate cancer [facility])     Past Surgical History:  Procedure Laterality Date  . PROSTATE BIOPSY    . RADIOLOGY WITH ANESTHESIA N/A 01/22/2018   Procedure: MRI of the brain WITH ANESTHESIA;  Surgeon: Radiologist, Medication, MD;  Location: [place];  Service: Radiology;  Laterality: N/A;  . REPLACEMENT TOTAL KNEE     bilateral   Family History:  Family History  Problem Relation Age of Onset  . Prostate cancer Father   . Prostate cancer Brother   . Prostate cancer Maternal Grandfather   . Breast cancer Neg Hx   . Pancreatic cancer Neg Hx   . Colon cancer Neg Hx    Family Psychiatric  History: Denies  Social History:  Social History   Substance and Sexual Activity  Alcohol Use Never  . Frequency: Never     Social History   Substance and Sexual Activity  Drug Use Never    Social History   Socioeconomic History  . Marital status: Married    Spouse name: Not on file  . Number of children: 1  . Years of education: Not on file  . Highest education level: Not on file  Occupational History  . Not on file  Social Needs  . Financial resource strain: Not on file  . Food insecurity:    Worry: Not on file    Inability: Not on file  . Transportation needs:    Medical: Not on file    Non-medical: Not  on file  Tobacco Use  . Smoking status: Never Smoker  . Smokeless tobacco: Never Used  Substance and Sexual Activity  . Alcohol use: Never    Frequency: Never  . Drug use: Never  . Sexual activity: Not Currently  Lifestyle  . Physical activity:    Days per week: Not on file    Minutes per session: Not on file  . Stress: Not on file  Relationships  . Social connections:    Talks on phone: Not on file    Gets together: Not on file    Attends religious service: Not on file    Active member of club or organization: Not on file    Attends meetings of clubs or organizations: Not on file    Relationship status: Not  on file  Other Topics Concern  . Not on file  Social History Narrative   12-17-17 Unable to ask abuse questions wife with with him today.    Sleep: Good  Appetite:  Fair  Current Medications: Current Facility-Administered Medications  Medication Dose Route Frequency Provider Last Rate Last Dose  . acetaminophen (TYLENOL) tablet 650 mg  650 mg Oral Q6H PRN Amin, Ankit Chirag, MD       Or  . acetaminophen (TYLENOL) suppository 650 mg  650 mg Rectal Q6H PRN Amin, Ankit Chirag, MD      . acetaminophen (TYLENOL) tablet 1,000 mg  1,000 mg Oral Q6H PRN Amin, Ankit Chirag, MD      . amLODipine (NORVASC) tablet 5 mg  5 mg Oral Daily Amin, Ankit Chirag, MD   5 mg at 01/23/18 1000  . bisacodyl (DULCOLAX) EC tablet 5 mg  5 mg Oral Daily PRN Amin, Ankit Chirag, MD      . clonazePAM (KLONOPIN) tablet 0.5 mg  0.5 mg Oral BID Amin, Ankit Chirag, MD   0.5 mg at 01/23/18 1000  . divalproex (DEPAKOTE SPRINKLE) capsule 500 mg  500 mg Oral Q12H Adhikari, Amrit, MD      . enoxaparin (LOVENOX) injection 40 mg  40 mg Subcutaneous Q24H Amin, Ankit Chirag, MD   40 mg at 01/22/18 2325  . gabapentin (NEURONTIN) capsule 300 mg  300 mg Oral BID Amin, Ankit Chirag, MD   300 mg at 01/23/18 1000  . insulin aspart (novoLOG) injection 0-9 Units  0-9 Units Subcutaneous TID WC Amin, Ankit Chirag, MD   1 Units at 01/21/18 1504  . phenylephrine (NEO-SYNEPHRINE) 1 % nasal drops 1 drop  1 drop Each Nare Q6H PRN Amin, Ankit Chirag, MD      . senna-docusate (Senokot-S) tablet 1 tablet  1 tablet Oral QHS PRN Amin, Ankit Chirag, MD      . sertraline (ZOLOFT) tablet 200 mg  200 mg Oral Daily Amin, Ankit Chirag, MD   200 mg at 01/23/18 1000  . vitamin B-12 (CYANOCOBALAMIN) tablet 100 mcg  100 mcg Oral Daily Shelly Coss, MD   100 mcg at 01/23/18 1000    Lab Results:  Results for orders placed or performed during the hospital encounter of 01/20/18 (from the past 48 hour(s))  CBG monitoring, ED     Status: Abnormal   Collection  Time: 01/21/18  2:46 PM  Result Value Ref Range   Glucose-Capillary 136 (H) 70 - 99 mg/dL  Glucose, capillary     Status: Abnormal   Collection Time: 01/21/18  5:23 PM  Result Value Ref Range   Glucose-Capillary 100 (H) 70 - 99 mg/dL  Ammonia     Status:  None   Collection Time: 01/21/18 10:53 PM  Result Value Ref Range   Ammonia 31 9 - 35 umol/L    Comment: Performed at [facility], [place] [address], [place], [place] [address]  RPR     Status: None   Collection Time: 01/21/18 10:54 PM  Result Value Ref Range   RPR Ser Ql Non Reactive Non Reactive    Comment: (NOTE) Performed At: [facility] [address] [place], [place] [id number] Rush Farmer MD [place]:[id number]   Glucose, capillary     Status: Abnormal   Collection Time: 01/21/18 11:52 PM  Result Value Ref Range   Glucose-Capillary 101 (H) 70 - 99 mg/dL  Comprehensive metabolic panel     Status: Abnormal   Collection Time: 01/22/18  4:25 AM  Result Value Ref Range   Sodium 141 135 - 145 mmol/L   Potassium 3.6 3.5 - 5.1 mmol/L   Chloride 106 98 - 111 mmol/L   CO2 24 22 - 32 mmol/L   Glucose, Bld 112 (H) 70 - 99 mg/dL   BUN 21 8 - 23 mg/dL   Creatinine, Ser 0.70 0.61 - 1.24 mg/dL   Calcium 8.8 (L) 8.9 - 10.3 mg/dL   Total Protein 7.5 6.5 - 8.1 g/dL   Albumin 4.1 3.5 - 5.0 g/dL   AST 23 15 - 41 U/L   ALT 18 0 - 44 U/L   Alkaline Phosphatase 69 38 - 126 U/L   Total Bilirubin 0.6 0.3 - 1.2 mg/dL   GFR calc non Af Amer >60 >60 mL/min   GFR calc Af Amer >60 >60 mL/min    Comment: (NOTE) The eGFR has been calculated using the CKD EPI equation. This calculation has not been validated in all clinical situations. eGFR's persistently <60 mL/min signify possible Chronic Kidney Disease.    Anion gap 11 5 - 15    Comment: Performed at [facility], [place] [address] [place], [place] [address]  CBC     Status: None   Collection Time: 01/22/18  4:25 AM  Result Value Ref Range   WBC  10.2 4.0 - 10.5 K/uL   RBC 4.93 4.22 - 5.81 MIL/uL   Hemoglobin 14.0 13.0 - 17.0 g/dL   HCT 43.1 39.0 - 52.0 %   MCV 87.4 80.0 - 100.0 fL   MCH 28.4 26.0 - 34.0 pg   MCHC 32.5 30.0 - 36.0 g/dL   RDW 14.0 11.5 - 15.5 %   Platelets 224 150 - 400 K/uL   nRBC 0.0 0.0 - 0.2 %    Comment: Performed at [facility], [place] [address], [place], [place] [address]  Glucose, capillary     Status: Abnormal   Collection Time: 01/22/18  7:31 AM  Result Value Ref Range   Glucose-Capillary 106 (H) 70 - 99 mg/dL  Glucose, capillary     Status: Abnormal   Collection Time: 01/22/18 12:04 PM  Result Value Ref Range   Glucose-Capillary 117 (H) 70 - 99 mg/dL   Comment 1 Notify RN    Comment 2 Document in Chart   Glucose, capillary     Status: None   Collection Time: 01/22/18  8:26 PM  Result Value Ref Range   Glucose-Capillary 95 70 - 99 mg/dL  Glucose, capillary     Status: None   Collection Time: 01/23/18  8:13 AM  Result Value Ref Range   Glucose-Capillary 92 70 - 99 mg/dL   Comment 1 Notify RN  Comment 2 Document in Chart   Glucose, capillary     Status: Abnormal   Collection Time: 01/23/18 11:50 AM  Result Value Ref Range   Glucose-Capillary 100 (H) 70 - 99 mg/dL    Blood Alcohol level:  Lab Results  Component Value Date   ETH <10 01/20/2018   ETH <5 09/13/2016    Musculoskeletal: Strength & Muscle Tone: within normal limits Gait & Station: UTA since patient is lying in bed. Patient leans: N/A  Psychiatric Specialty Exam: Physical Exam  Nursing note and vitals reviewed. Constitutional: He is oriented to person, place, and time. He appears well-developed and well-nourished.  HENT:  Head: Normocephalic and atraumatic.  Neck: Normal range of motion.  Respiratory: Effort normal.  Musculoskeletal: Normal range of motion.  Neurological: He is alert and oriented to person, place, and time.  Psychiatric: He has a normal mood and affect. His speech is normal and  behavior is normal. Judgment and thought content normal. Cognition and memory are normal.    Review of Systems  Constitutional: Negative for chills and fever.  Cardiovascular: Positive for chest pain (Left sided rib pain due to fall.).  Gastrointestinal: Negative for abdominal pain, constipation, diarrhea, nausea and vomiting.  Psychiatric/Behavioral: Negative for depression, hallucinations and suicidal ideas. The patient does not have insomnia.   All other systems reviewed and are negative.   Blood pressure 138/80, pulse 84, temperature 98.7 F (37.1 C), temperature source Oral, resp. rate 18, height 6' 1.5" (1.867 m), weight 122.3 kg, SpO2 93 %.Body mass index is 35.09 kg/m.  General Appearance: Fairly Groomed, elderly, Caucasian male wearing a hospital gown with corrective lenses who is sitting in a chair. NAD.   Eye Contact:  Good  Speech:  Clear and Coherent and Normal Rate  Volume:  Normal  Mood:  Euthymic  Affect:  Appropriate and Congruent  Thought Process:  Goal Directed, Linear and Descriptions of Associations: Intact  Orientation:  Full (Time, Place, and Person)  Thought Content:  Logical  Suicidal Thoughts:  No  Homicidal Thoughts:  No  Memory:  Immediate;   Fair Recent;   Fair Remote;   Fair  Judgement:  Fair  Insight:  Fair  Psychomotor Activity:  Normal  Concentration:  Concentration: Good and Attention Span: Good  Recall:  Good  Fund of Knowledge:  Good  Language:  Good  Akathisia:  No  Handed:  Right  AIMS (if indicated):     Assets:  Communication Skills Desire for Improvement Housing Intimacy Social Support  ADL's:  Intact  Cognition:  WNL  Sleep:   Okay     Assessment:  Calvin Santiago is a 72 y.o. male who was admitted with altered mental status and bizarre behavior in the setting of recently visiting his ill mother. He was witnessed to have a generalized seizure and was admitted to the medicine service. His workup has been unremarkable. EEG was  unremarkable for seizure activity. He is receiving vitamin B12 supplementation and received IV thiamine supplementation. He continues to exhibit bizarre behavior with mood lability. His wife reports that his presentation is similar to a year ago after his mother was diagnosed with cancer. Differential diagnosis includes MDD versus acute stress reaction. He warrants inpatient psychiatric hospitalization for further stabilization and treatment.   Treatment Plan Summary: -Patient warrants inpatient psychiatric hospitalization given high risk of inability to care for self in setting of altered mental status/mood lability. -Continue bedside sitter.  -Continue Zoloft 200 mg daily for depression and anxiety. -Continue  Gabapentin 300 mg BID for anxiety. -Continue Klonopin 0.5 mg BID for anxiety. This medication is currently being tapered to discontinuation by his outpatient doctor.  -Continue Depakote 500 mg BID for mood stabilization/seizure prophylaxis.  -Wellbutrin was discontinued due to risk of lowering seizure threshold.  -Please pursue involuntary commitment if patient refuses voluntary psychiatric hospitalization or attempts to leave the hospital.  -Will sign off on patient at this time. Please consult psychiatry again as needed.    Faythe Dingwall, DO 01/23/2018, 1:18 PM

## 2018-01-24 LAB — GLUCOSE, CAPILLARY
GLUCOSE-CAPILLARY: 94 mg/dL (ref 70–99)
Glucose-Capillary: 75 mg/dL (ref 70–99)
Glucose-Capillary: 113 mg/dL — ABNORMAL HIGH (ref 70–99)
Glucose-Capillary: 171 mg/dL — ABNORMAL HIGH (ref 70–99)
Glucose-Capillary: 101 mg/dL — ABNORMAL HIGH (ref 70–99)

## 2018-01-24 LAB — VITAMIN B1: Vitamin B1 (Thiamine): 115.9 nmol/L (ref 66.5–200.0)

## 2018-01-24 MED ORDER — TRAMADOL HCL 50 MG PO TABS: 50.0000 mg | ORAL_TABLET | Freq: Four times a day (QID) | ORAL | 0 refills | Status: DC | PRN

## 2018-01-24 MED ORDER — DIVALPROEX SODIUM 125 MG PO CSDR: 500.0000 mg | DELAYED_RELEASE_CAPSULE | Freq: Two times a day (BID) | ORAL | 0 refills | Status: DC

## 2018-01-24 MED ORDER — CYANOCOBALAMIN 100 MCG PO TABS: 100.0000 ug | ORAL_TABLET | Freq: Every day | ORAL | 0 refills | Status: DC

## 2018-01-24 MED ORDER — TRAMADOL HCL 50 MG PO TABS
50.0000 mg | ORAL_TABLET | Freq: Four times a day (QID) | ORAL | Status: DC | PRN
  Administered 2018-01-25 – 2018-01-26 (×3): 50 mg via ORAL
  Filled 2018-01-24 (×3): qty 1

## 2018-01-24 NOTE — Discharge Summary (Signed)
Physician Discharge Summary  Calvin Santiago TXM:[id number] DOB: February 27, 1946 DOA: 01/20/2018  PCP: London Pepper, MD  Admit date: 01/20/2018 Discharge date: 01/24/2018  Admitted From: Home Disposition:  Inpatient psych  Discharge Condition:Stable CODE STATUS:FULL, Diet recommendation: Heart Healthy  Brief/Interim Summary:  Patient is a 73 year old male with past medical history of severe recurrent major depressive disorder, adenocarcinoma of the prostate, panic attack, PTSD who was brought to the hospital for the evaluation of change in mental status at home as recommended by his psychiatrist.  He follows with Dr. Darleene Cleaver who suggested  him to go to the emergency department for further evaluation of bizarre behavior.  He was noticed to have change in the usual behavior after he visited her mom who has terminal cancer.  He had similar presentation about a year ago with similar situation.  Patient was being planned for admission to the psychiatric unit when he was noticed to have seizure episode.  Then he was admitted under medical service.  Neurology /psychiatry were following.  Started on antiseizure medications.   MRI of the brain did not show any acute intracranial abnormalities.  Neurology signed off.    Psychiatry has again evaluated him on the floor and said his mental status changes could be from major depressive disorder versus acute stress reaction.  Psychiatry recommended inpatient psychiatric hospitalization for further stabilization and treatment.  Plan is to transfer him to inpatient psych as soon as the bed is available.  Social worker consulted.  Following problems were addressed during his hospitalization:   Altered mental status:Alert and oriented back to his baseline.  Continue current medications.  MRI did not show any acute intracranial normalities. Psychiatry has again evaluated him on the floor and said his mental status changes could be from major depressive disorder versus  acute stress reaction.  Psychiatry recommended inpatient psychiatric hospitalization for further stabilization and treatment.  He currently agrees with transfer to psych unit.  If he denies, we should initiate IVC.  Suspected seizure disorder: As per the records, he was noticed to have generalized tonic-clonic seizure in the emergency department.  He never had seizure in the past.  No history of seizure disorder.  Not on any seizure medication.  Neurology was consulted.  Loaded with valproate.  Continue oral valproate for now.  EEG did not show any clear epileptiform activities but was concern for encephalopathy.  He will follow-up with neurology as an outpatient. Recommended to get a Depakote level in 2 weeks.  Vitamin B12 deficiency: Started on supplementation.  Severe recurrent depression/anxiety: Plan is to admit him to psych now.  Psychiatry will be following. Continue current medications.  His altered mental status could be also from psychosis from severe depression.  Adenocarcinoma of the prostate: Stage T1c.  Currently undergoing radiotherapy at the cancer center  Hypertension: Currently above stable.  Continue amlodipine.  Chest pain/pain: He recently fell at home and as per the wife was found to have a rib fracture on the left side.  Continue pain management.On tramadol.  Ambulates very well on the hallway.No need for PT now.      Discharge Diagnoses:  Principal Problem:   Major depressive disorder, recurrent episode, severe (HCC) Active Problems:   Panic disorder (episodic paroxysmal anxiety)   Malignant neoplasm of prostate (HCC)   Seizure (HCC)   Acute metabolic encephalopathy   Altered mental status    Discharge Instructions  Discharge Instructions    Diet - low sodium heart healthy   Complete by:  As directed  Discharge instructions   Complete by:  As directed    1) Follow up with psychiatry. Continue current meds 2)Check Depakote level in 2 weeks.Follow up  with neurology as an outpatient in 2 weeks.   Increase activity slowly   Complete by:  As directed      Allergies as of 01/24/2018      Reactions   Sulfa Antibiotics Other (See Comments)   As a child, he ran into walls      Medication List    STOP taking these medications   buPROPion 150 MG 24 hr tablet Commonly known as:  WELLBUTRIN XL     TAKE these medications   acetaminophen 500 MG tablet Commonly known as:  TYLENOL Take 1,000 mg by mouth every 6 (six) hours as needed for moderate pain.   amLODipine 5 MG tablet Commonly known as:  NORVASC Take 5 mg by mouth daily.   clonazePAM 0.5 MG tablet Commonly known as:  KLONOPIN Take 0.5 mg by mouth 2 (two) times daily.   cyanocobalamin 100 MCG tablet Take 1 tablet (100 mcg total) by mouth daily. Start taking on:  01/25/2018   divalproex 125 MG capsule Commonly known as:  DEPAKOTE SPRINKLE Take 4 capsules (500 mg total) by mouth every 12 (twelve) hours.   gabapentin 300 MG capsule Commonly known as:  NEURONTIN Take 300 mg by mouth 2 (two) times daily.   phenylephrine 1 % nasal spray Commonly known as:  NEO-SYNEPHRINE Place 1 drop into both nostrils every 6 (six) hours as needed for congestion.   sertraline 100 MG tablet Commonly known as:  ZOLOFT Take 200 mg by mouth daily.   traMADol 50 MG tablet Commonly known as:  ULTRAM Take 1 tablet (50 mg total) by mouth every 6 (six) hours as needed for moderate pain.   traZODone 50 MG tablet Commonly known as:  DESYREL Take 50 mg by mouth at bedtime.      Follow-up Information    London Pepper, MD. Schedule an appointment as soon as possible for a visit in 1 week(s).   Specialty:  Family Medicine Contact information: [address] [address] [phone]        [facility] Follow up in 2 day(s).   Specialty:  Neurology Contact information: [place] [phone]         Allergies  Allergen Reactions  . Sulfa Antibiotics Other (See Comments)    As a child, he ran into walls    Consultations: Neurology, psychiatry  Procedures/Studies: Ct Head Wo Contrast  Result Date: 01/21/2018 CLINICAL DATA:  Seizure-like activity.  Prostate cancer. EXAM: CT HEAD WITHOUT CONTRAST TECHNIQUE: Contiguous axial images were obtained from the base of the skull through the vertex without intravenous contrast. COMPARISON:  05/30/2017 FINDINGS: Brain: Cerebral and cerebellar atrophy. No mass lesion, hemorrhage, hydrocephalus, acute infarct, intra-axial, or extra-axial fluid collection. Vascular: No hyperdense vessel or unexpected calcification. Skull: Normal Sinuses/Orbits: Normal imaged portions of the orbits and globes. Clear paranasal sinuses and mastoid air cells. Other: None. IMPRESSION: No acute intracranial abnormality. Electronically Signed   By: Abigail Miyamoto M.D.   On: 01/21/2018 07:31   Mr Brain Wo Contrast  Result Date: 01/22/2018 CLINICAL DATA:  Altered mental status. History of prostate cancer and severe recurrent depression. EXAM: MRI HEAD WITHOUT CONTRAST TECHNIQUE: Multiplanar, multiecho pulse sequences of the brain and surrounding structures were obtained without intravenous contrast. COMPARISON:  CT HEAD January 21, 2018 FINDINGS: INTRACRANIAL CONTENTS: No reduced diffusion to suggest acute ischemia. No susceptibility artifact to suggest hemorrhage. The ventricles and sulci are normal for patient's age. No suspicious parenchymal signal, masses, mass effect. No abnormal extra-axial fluid collections. No extra-axial masses. VASCULAR: Normal major intracranial vascular flow voids present at skull base. SKULL AND UPPER CERVICAL SPINE: No abnormal sellar expansion. No suspicious calvarial bone marrow signal. Craniocervical junction maintained. SINUSES/ORBITS: The mastoid air-cells and included paranasal sinuses are well-aerated.The included  ocular globes and orbital contents are non-suspicious. OTHER: Life support lines in place with layering secretions in pharynx. IMPRESSION: Normal noncontrast MRI head for age. Electronically Signed   By: Elon Alas M.D.   On: 01/22/2018 17:42      Subjective: Patient seen and examined the bedside this morning.  Remains comfortable.  No active issues.  Discussed extensively with the wife about the current plan.  Stable for transfer to inpatient psych as soon as the bed is available.  Social worker notified  Discharge Exam: Vitals:   01/23/18 2127 01/24/18 0622  BP: 139/89 (!) 145/96  Pulse: 77 80  Resp: 18 18  Temp: 99.3 F (37.4 C) 98.6 F (37 C)  SpO2: 97% 96%   Vitals:   01/23/18 0538 01/23/18 1452 01/23/18 2127 01/24/18 0622  BP: 138/80 133/89 139/89 (!) 145/96  Pulse: 84 91 77 80  Resp: 18 16 18 18   Temp: 98.7 F (37.1 C) 99.2 F (37.3 C) 99.3 F (37.4 C) 98.6 F (37 C)  TempSrc: Oral Oral Oral Oral  SpO2: 93% 93% 97% 96%  Weight:      Height:        General: Pt is alert, awake, not in acute distress Cardiovascular: RRR, S1/S2 +, no rubs, no gallops Respiratory: CTA bilaterally, no wheezing, no rhonchi Abdominal: Soft, NT, ND, bowel sounds + Extremities: no edema, no cyanosis    The results of significant diagnostics from this hospitalization (including imaging, microbiology, ancillary and laboratory) are listed below for reference.     Microbiology: No results found for this or any previous visit (from the past 240 hour(s)).   Labs: BNP (last 3 results) No results for input(s): BNP in the last 8760 hours. Basic Metabolic Panel: Recent Labs  Lab 01/20/18 1357 01/21/18 0635 01/21/18 0644 01/21/18 1055 01/22/18 0425  NA 142 139 140  --  141  K 3.8 3.4* 3.8  --  3.6  CL 105 100 105  --  106  CO2 24 16*  --   --  24  GLUCOSE 88 158* 153*  --  112*  BUN 21 23 28*  --  21  CREATININE 1.04 1.07 0.90 0.79 0.70  CALCIUM 9.3 9.8  --   --  8.8*    Liver Function Tests: Recent Labs  Lab 01/20/18 1357 01/22/18 0425  AST 25 23  ALT 19 18  ALKPHOS 69 69  BILITOT 1.5* 0.6  PROT 7.7 7.5  ALBUMIN 4.5 4.1   No results for input(s): LIPASE, AMYLASE in the last 168 hours. Recent Labs  Lab 01/21/18 2253  AMMONIA 31   CBC: Recent Labs  Lab 01/20/18 1357 01/21/18 0635 01/21/18 0644 01/21/18 1055 01/22/18 0425  WBC 8.0 13.4*  --  10.4 10.2  NEUTROABS  --  10.1*  --   --   --   HGB 13.9 15.5 15.0 13.6 14.0  HCT 43.6 50.2 44.0 42.3  46.9 43.1  MCV 88.3 93.1  --  89.4 87.4  PLT 213 285  --  240 224   Cardiac Enzymes: No results for input(s): CKTOTAL, CKMB, CKMBINDEX, TROPONINI in the last 168 hours. BNP: Invalid input(s): POCBNP CBG: Recent Labs  Lab 01/23/18 1150 01/23/18 1712 01/23/18 2100 01/24/18 0726 01/24/18 1149  GLUCAP 100* 92 94 75 113*   D-Dimer No results for input(s): DDIMER in the last 72 hours. Hgb A1c No results for input(s): HGBA1C in the last 72 hours. Lipid Profile No results for input(s): CHOL, HDL, LDLCALC, TRIG, CHOLHDL, LDLDIRECT in the last 72 hours. Thyroid function studies No results for input(s): TSH, T4TOTAL, T3FREE, THYROIDAB in the last 72 hours.  Invalid input(s): FREET3 Anemia work up No results for input(s): VITAMINB12, FOLATE, FERRITIN, TIBC, IRON, RETICCTPCT in the last 72 hours. Urinalysis    Component Value Date/Time   COLORURINE YELLOW 01/20/2018 1459   APPEARANCEUR CLEAR 01/20/2018 1459   LABSPEC 1.016 01/20/2018 1459   PHURINE 6.0 01/20/2018 1459   GLUCOSEU NEGATIVE 01/20/2018 1459   HGBUR NEGATIVE 01/20/2018 1459   BILIRUBINUR NEGATIVE 01/20/2018 1459   KETONESUR 80 (A) 01/20/2018 1459   PROTEINUR 30 (A) 01/20/2018 1459   NITRITE NEGATIVE 01/20/2018 1459   LEUKOCYTESUR NEGATIVE 01/20/2018 1459   Sepsis Labs Invalid input(s): PROCALCITONIN,  WBC,  LACTICIDVEN Microbiology No results found for this or any previous visit (from the past 240 hour(s)).  Please  note: You were cared for by a hospitalist during your hospital stay. Once you are discharged, your primary care physician will handle any further medical issues. Please note that NO REFILLS for any discharge medications will be authorized once you are discharged, as it is imperative that you return to your primary care physician (or establish a relationship with a primary care physician if you do not have one) for your post hospital discharge needs so that they can reassess your need for medications and monitor your lab values.    Time coordinating discharge: 40 minutes  SIGNED:   Shelly Coss, MD  [facility] 01/24/2018, 11:54 AM Pager [phone]  If 7PM-7AM, please contact night-coverage [email or web address] Password [email or web address]

## 2018-01-25 ENCOUNTER — Inpatient Hospital Stay (HOSPITAL_COMMUNITY): Payer: Medicare Other

## 2018-01-25 DIAGNOSIS — M25472 Effusion, left ankle: Secondary | ICD-10-CM

## 2018-01-25 LAB — GLUCOSE, CAPILLARY
GLUCOSE-CAPILLARY: 95 mg/dL (ref 70–99)
GLUCOSE-CAPILLARY: 92 mg/dL (ref 70–99)
Glucose-Capillary: 126 mg/dL — ABNORMAL HIGH (ref 70–99)
Glucose-Capillary: 99 mg/dL (ref 70–99)

## 2018-01-25 MED ORDER — LIP MEDEX EX OINT
TOPICAL_OINTMENT | CUTANEOUS | Status: DC | PRN
  Administered 2018-01-25: 07:00:00 via TOPICAL
  Filled 2018-01-25: qty 7

## 2018-01-25 MED ORDER — COLCHICINE 0.6 MG PO TABS
0.6000 mg | ORAL_TABLET | Freq: Two times a day (BID) | ORAL | Status: DC
  Administered 2018-01-25 – 2018-01-27 (×4): 0.6 mg via ORAL
  Filled 2018-01-25 (×4): qty 1

## 2018-01-25 MED ORDER — ACETAMINOPHEN 500 MG PO TABS
1000.0000 mg | ORAL_TABLET | Freq: Four times a day (QID) | ORAL | Status: DC | PRN
  Administered 2018-01-25: 1000 mg via ORAL
  Filled 2018-01-25: qty 2

## 2018-01-25 NOTE — Care Management Important Message (Signed)
Important Message  Patient Details  Name: Calvin Santiago MRN: [id number] Date of Birth: 06/29/1945   Medicare Important Message Given:  Yes    Erenest Rasher, RN 01/25/2018, 11:56 AM

## 2018-01-25 NOTE — Progress Notes (Signed)
[facility]  PROGRESS NOTE  Calvin Santiago MWU:[id number] DOB: Oct 28, 1945 DOA: 01/20/2018 PCP: London Pepper, MD   Brief HPI:   72 year old male with a history of severe recurrent major depressive disorder, adenocarcinoma of the prostate, panic disorder, PTSD was brought to the hospital for evaluation of mental status change at home.  Patient was planned to have inpatient psychiatric unit admission when he was noted to have a seizure episode.  Patient was seen by neurology and psychiatry.  Patient was placed on valproic acid, EEG did not show any seizure activity.  MRI showed no acute abnormalities.  Neurology signed off.  Psychiatry has also recommended inpatient psychiatric transfer.  Patient is awaiting transfer to inpatient psych center.    Subjective   This morning patient complained of left foot pain.  He does have a history of gout.   Assessment/Plan:     1. ?  Gout flare up-patient has a history of gout and now has increased redness, pain and swelling of left foot.  Suspect gout.  Will start him on colchicine 0.6 mg p.o. twice daily, obtain x-ray of the left foot.  2. Altered mental status-resolved, patient is back to baseline.  MRI did not show any acute abnormalities.  3. ?  Seizure-patient had questionable seizure in the ED, neurology was consulted patient given valproate.  EEG did not show any epileptiform activity.  Patient will follow-up with neurology as outpatient.  Recommended to check Depakote level in 2 weeks.  4. Vitamin B12 deficiency-continue supplementation.  5. Severe recurrent depression/anxiety-psychiatry has seen the patient and recommend inpatient psychiatric transfer.  6. Adenocarcinoma the prostate-currently undergoing radiotherapy at the cancer center.  7. Hypertension-blood pressure stable, continue amlodipine.     CBG: Recent Labs  Lab 01/24/18 1149 01/24/18 1650 01/24/18 2101 01/25/18 0725 01/25/18 1150  GLUCAP 113* 171* 101* 95 126*     CBC: Recent Labs  Lab 01/20/18 1357 01/21/18 0635 01/21/18 0644 01/21/18 1055 01/22/18 0425  WBC 8.0 13.4*  --  10.4 10.2  NEUTROABS  --  10.1*  --   --   --   HGB 13.9 15.5 15.0 13.6 14.0  HCT 43.6 50.2 44.0 42.3  46.9 43.1  MCV 88.3 93.1  --  89.4 87.4  PLT 213 285  --  240 725    Basic Metabolic Panel: Recent Labs  Lab 01/20/18 1357 01/21/18 0635 01/21/18 0644 01/21/18 1055 01/22/18 0425  NA 142 139 140  --  141  K 3.8 3.4* 3.8  --  3.6  CL 105 100 105  --  106  CO2 24 16*  --   --  24  GLUCOSE 88 158* 153*  --  112*  BUN 21 23 28*  --  21  CREATININE 1.04 1.07 0.90 0.79 0.70  CALCIUM 9.3 9.8  --   --  8.8*     DVT prophylaxis: Lovenox  Code Status: Full code  Family Communication: No family at bedside  Disposition Plan: likely home when medically ready for discharge   Consultants:  Neurology  Psychiatry  Procedures:  None   Antibiotics:   Anti-infectives (From admission, onward)   None       Objective   Vitals:   01/24/18 2058 01/25/18 0500 01/25/18 1019 01/25/18 1108  BP: (!) 146/97 (!) 147/90 136/87   Pulse: 91 72    Resp: (!) 22 18    Temp: 98.8 F (37.1 C) 98.2 F (36.8 C)    TempSrc: Oral Oral  SpO2: 94% 96%  93%  Weight:  120.3 kg    Height:        Intake/Output Summary (Last 24 hours) at 01/25/2018 1223 Last data filed at 01/25/2018 0113 Gross per 24 hour  Intake 240 ml  Output 200 ml  Net 40 ml   Filed Weights   01/20/18 1224 01/22/18 0517 01/25/18 0500  Weight: 125.6 kg 122.3 kg 120.3 kg     Physical Examination:    General: Appears in no acute distress  Cardiovascular: S1-S2, regular, no murmurs auscultated  Respiratory: Clear to auscultation bilaterally  Abdomen: Soft, nontender, no organomegaly  Extremities: Left foot has tenderness at the lateral aspect with mild erythema and warmth to touch.  Neurologic: Alert, oriented x3, no focal deficit noted.     Data Reviewed: I have  personally reviewed following labs and imaging studies   No results found for this or any previous visit (from the past 240 hour(s)).   Liver Function Tests: Recent Labs  Lab 01/20/18 1357 01/22/18 0425  AST 25 23  ALT 19 18  ALKPHOS 69 69  BILITOT 1.5* 0.6  PROT 7.7 7.5  ALBUMIN 4.5 4.1   No results for input(s): LIPASE, AMYLASE in the last 168 hours. Recent Labs  Lab 01/21/18 2253  AMMONIA 31    Cardiac Enzymes: No results for input(s): CKTOTAL, CKMB, CKMBINDEX, TROPONINI in the last 168 hours. BNP (last 3 results) No results for input(s): BNP in the last 8760 hours.  ProBNP (last 3 results) No results for input(s): PROBNP in the last 8760 hours.    Studies: No results found.  Scheduled Meds: . amLODipine  5 mg Oral Daily  . clonazePAM  0.5 mg Oral BID  . colchicine  0.6 mg Oral BID  . divalproex  500 mg Oral Q12H  . enoxaparin (LOVENOX) injection  40 mg Subcutaneous Q24H  . gabapentin  300 mg Oral BID  . insulin aspart  0-9 Units Subcutaneous TID WC  . sertraline  200 mg Oral Daily  . vitamin B-12  100 mcg Oral Daily    Admission status: Inpatient: Based on patients clinical presentation and evaluation of above clinical data, I have made determination that patient meets Inpatient criteria at this time.  Patient is awaiting transfer to inpatient psychiatric facility.  Time spent: 25 min  [facility] Pager [phone]. If 7PM-7AM, please contact night-coverage at [email or web address], Office  [phone]  password [email or web address]  01/25/2018, 12:23 PM  LOS: 3 days

## 2018-01-25 NOTE — Anesthesia Postprocedure Evaluation (Signed)
Anesthesia Post Note  Patient: Jakyron Fabro  Procedure(s) Performed: MRI of the brain WITH ANESTHESIA (N/A )     Patient location during evaluation: PACU Anesthesia Type: General Level of consciousness: awake and patient cooperative Pain management: pain level controlled Vital Signs Assessment: post-procedure vital signs reviewed and stable Respiratory status: spontaneous breathing, nonlabored ventilation, respiratory function stable and patient connected to nasal cannula oxygen Cardiovascular status: blood pressure returned to baseline and stable Postop Assessment: no apparent nausea or vomiting Anesthetic complications: no    Last Vitals:  Vitals:   01/25/18 1108 01/25/18 1255  BP:  133/76  Pulse:    Resp:  18  Temp:  37.6 C  SpO2: 93%     Last Pain:  Vitals:   01/25/18 1255  TempSrc: Oral  PainSc:                  Dianne Whelchel

## 2018-01-25 NOTE — Care Management Note (Signed)
Case Management Note  Patient Details  Name: Calvin Santiago MRN: [id number] Date of Birth: 1945-03-16  Subjective/Objective:   AMS, Suspected Seizure Disorder, Cancer of prostate                 Action/Plan: NCM spoke to pt and wife at bedside. States he experience unsteadiness while standing. Wife is requesting Conservation officer, nature with seat. NCM notified MD. Will discuss having PT evaluation. Contacted [facility] for RW with seat to be delivered to room prior to dc.   Expected Discharge Date:  01/24/18               Expected Discharge Plan:  Psychiatric Hospital(not sure at present time SNF vs [facility])  In-House Referral:  Clinical Social Work  Discharge planning Services     Post Acute Care Choice:  NA Choice offered to:  NA  DME Arranged:  Walker rolling with seat DME Agency:  [place]:  NA [place] Agency:  NA  Status of Service:  Completed, signed off  If discussed at [place] of Stay Meetings, dates discussed:    Additional Comments:  Erenest Rasher, RN 01/25/2018, 11:59 AM

## 2018-01-26 LAB — GLUCOSE, CAPILLARY
GLUCOSE-CAPILLARY: 74 mg/dL (ref 70–99)
Glucose-Capillary: 111 mg/dL — ABNORMAL HIGH (ref 70–99)
Glucose-Capillary: 164 mg/dL — ABNORMAL HIGH (ref 70–99)
Glucose-Capillary: 91 mg/dL (ref 70–99)

## 2018-01-26 NOTE — Progress Notes (Signed)
Met with pt at bedside to discuss disposition plans- inpatient geri-psych admission recommended during psychiatric evaluation 01/23/18. This morning both pt and wife report that he "is much more like back to normal," and asking if pt could be re-evaluated for psychiatric needs. Requesting to go home and follow up with his psychiatrist (Dr Akintayo) on December 10th. Denies SI today and states he is feeling less depressed/angry. Wife reports she agrees in pt's assessment of his improvement. They are both still open to any recommendations. Updated attending. Will follow to continue assisting.   , MSW, LCSW Clinical Social Work 01/26/2018 [phone]   

## 2018-01-26 NOTE — Progress Notes (Signed)
[facility]  PROGRESS NOTE  Calvin Santiago DDU:[id number] DOB: [birth year] DOA: 01/20/2018 PCP: London Pepper, MD   Brief HPI:   72 year old male with a history of severe recurrent major depressive disorder, adenocarcinoma of the prostate, panic disorder, PTSD was brought to the hospital for evaluation of mental status change at home.  Patient was planned to have inpatient psychiatric unit admission when he was noted to have a seizure episode.  Patient was seen by neurology and psychiatry.  Patient was placed on valproic acid, EEG did not show any seizure activity.  MRI showed no acute abnormalities.  Neurology signed off.  Psychiatry has also recommended inpatient psychiatric transfer.  Patient is awaiting transfer to inpatient psych center.    Subjective   Patient seen and examined, left foot pain has improved.  He was started on colchicine for gout.  X-ray of left foot also confirms gouty arthritis.   Assessment/Plan:     1. ?  Gout flare up-patient has a history of gout and complained of  increased redness, pain and swelling of left foot.  X-ray of the left foot confirm gouty arthritis.  Improved on colchicine.  Continue colchicine 0.6 mg p.o. twice daily.  2. Altered mental status-resolved, patient is back to baseline.  MRI did not show any acute abnormalities.  3. ?  Seizure-patient had questionable seizure in the ED, neurology was consulted patient given valproate.  EEG did not show any epileptiform activity.  Patient will follow-up with neurology as outpatient.  Recommended to check Depakote level in 2 weeks.  4. Vitamin B12 deficiency-continue supplementation.  5. Severe recurrent depression/anxiety-psychiatry has seen the patient and recommend inpatient psychiatric transfer.  Patient and his wife are recommending reevaluation by psychiatry.  Will again consult psych for evaluation as patient is feeling better.  6. Adenocarcinoma the prostate-currently undergoing radiotherapy  at the cancer center.  7. Hypertension-blood pressure stable, continue amlodipine.     CBG: Recent Labs  Lab 01/25/18 1150 01/25/18 1653 01/25/18 2124 01/26/18 0742 01/26/18 1156  GLUCAP 126* 92 99 74 111*    CBC: Recent Labs  Lab 01/20/18 1357 01/21/18 0635 01/21/18 0644 01/21/18 1055 01/22/18 0425  WBC 8.0 13.4*  --  10.4 10.2  NEUTROABS  --  10.1*  --   --   --   HGB 13.9 15.5 15.0 13.6 14.0  HCT 43.6 50.2 44.0 42.3  46.9 43.1  MCV 88.3 93.1  --  89.4 87.4  PLT 213 285  --  240 237    Basic Metabolic Panel: Recent Labs  Lab 01/20/18 1357 01/21/18 0635 01/21/18 0644 01/21/18 1055 01/22/18 0425  NA 142 139 140  --  141  K 3.8 3.4* 3.8  --  3.6  CL 105 100 105  --  106  CO2 24 16*  --   --  24  GLUCOSE 88 158* 153*  --  112*  BUN 21 23 28*  --  21  CREATININE 1.04 1.07 0.90 0.79 0.70  CALCIUM 9.3 9.8  --   --  8.8*     DVT prophylaxis: Lovenox  Code Status: Full code  Family Communication: No family at bedside  Disposition Plan: likely home when medically ready for discharge   Consultants:  Neurology  Psychiatry  Procedures:  None   Antibiotics:   Anti-infectives (From admission, onward)   None       Objective   Vitals:   01/25/18 1108 01/25/18 1255 01/25/18 2049 01/26/18 0412  BP:  133/76 139/86 132/87  Pulse:   88 75  Resp:  18 16 14   Temp:  99.6 F (37.6 C) 99.6 F (37.6 C) 98.2 F (36.8 C)  TempSrc:  Oral Oral Oral  SpO2: 93%  97% 98%  Weight:    119.6 kg  Height:        Intake/Output Summary (Last 24 hours) at 01/26/2018 1329 Last data filed at 01/26/2018 1309 Gross per 24 hour  Intake 977 ml  Output 1200 ml  Net -223 ml   Filed Weights   01/22/18 0517 01/25/18 0500 01/26/18 0412  Weight: 122.3 kg 120.3 kg 119.6 kg     Physical Examination:    Mouth: Oral mucosa is moist, no lesions on palate,  Neck: Supple, no deformities, masses, or tenderness Lungs: Normal respiratory effort, bilateral clear  to auscultation, no crackles or wheezes.  Heart: Regular rate and rhythm, S1 and S2 normal, no murmurs, rubs auscultated Abdomen: BS normoactive,soft,nondistended,non-tender to palpation,no organomegaly Extremities: No pretibial edema, no erythema, no cyanosis, no clubbing Neuro : Alert and oriented to time, place and person, No focal deficits  Skin: No rashes seen on exam     Data Reviewed: I have personally reviewed following labs and imaging studies   No results found for this or any previous visit (from the past 240 hour(s)).   Liver Function Tests: Recent Labs  Lab 01/20/18 1357 01/22/18 0425  AST 25 23  ALT 19 18  ALKPHOS 69 69  BILITOT 1.5* 0.6  PROT 7.7 7.5  ALBUMIN 4.5 4.1   No results for input(s): LIPASE, AMYLASE in the last 168 hours. Recent Labs  Lab 01/21/18 2253  AMMONIA 31    Cardiac Enzymes: No results for input(s): CKTOTAL, CKMB, CKMBINDEX, TROPONINI in the last 168 hours. BNP (last 3 results) No results for input(s): BNP in the last 8760 hours.  ProBNP (last 3 results) No results for input(s): PROBNP in the last 8760 hours.    Studies: Dg Foot 2 Views Left  Result Date: 01/25/2018 CLINICAL DATA:  Left foot swelling and pain dorsally. History of gout EXAM: LEFT FOOT - 2 VIEW COMPARISON:  None. FINDINGS: Degenerative changes noted of the left foot first and second MTP joints and the first toe interphalangeal joint. These areas demonstrate joint space loss, sclerosis, irregularity and bony spurring. Small erosions suspected of the left first toe interphalangeal joint. Findings may represent osteoarthritis or gout arthritis. No acute osseous finding, fracture, malalignment. No soft tissue abnormality. No focal swelling. IMPRESSION: Degenerative arthritis of the left foot as above compatible with osteoarthritis versus gout arthritis. No acute osseous finding or fracture. Electronically Signed   By: Jerilynn Mages.  Shick M.D.   On: 01/25/2018 14:27    Scheduled  Meds: . amLODipine  5 mg Oral Daily  . clonazePAM  0.5 mg Oral BID  . colchicine  0.6 mg Oral BID  . divalproex  500 mg Oral Q12H  . enoxaparin (LOVENOX) injection  40 mg Subcutaneous Q24H  . gabapentin  300 mg Oral BID  . insulin aspart  0-9 Units Subcutaneous TID WC  . sertraline  200 mg Oral Daily  . vitamin B-12  100 mcg Oral Daily    Admission status: Inpatient: Based on patients clinical presentation and evaluation of above clinical data, I have made determination that patient meets Inpatient criteria at this time.  Patient is awaiting transfer to inpatient psychiatric facility.  Time spent: 25 min  [facility] Pager [phone]. If 7PM-7AM, please contact night-coverage at  [email or web address], Office  [phone]  password [email or web address]  01/26/2018, 1:29 PM  LOS: 4 days

## 2018-01-27 DIAGNOSIS — F430 Acute stress reaction: Secondary | ICD-10-CM

## 2018-01-27 LAB — GLUCOSE, CAPILLARY
Glucose-Capillary: 132 mg/dL — ABNORMAL HIGH (ref 70–99)
Glucose-Capillary: 150 mg/dL — ABNORMAL HIGH (ref 70–99)

## 2018-01-27 MED ORDER — CALCIUM CARBONATE ANTACID 500 MG PO CHEW
1.0000 | CHEWABLE_TABLET | Freq: Three times a day (TID) | ORAL | Status: DC | PRN
  Filled 2018-01-27: qty 1

## 2018-01-27 MED ORDER — VITAMIN B-12 1000 MCG PO TABS: 1000.0000 ug | ORAL_TABLET | Freq: Every day | ORAL | 3 refills | Status: DC

## 2018-01-27 MED ORDER — LORATADINE 10 MG PO TABS: 10.0000 mg | ORAL_TABLET | Freq: Every day | ORAL | Status: DC

## 2018-01-27 MED ORDER — DIVALPROEX SODIUM 500 MG PO DR TAB: 500.0000 mg | DELAYED_RELEASE_TABLET | Freq: Two times a day (BID) | ORAL | 3 refills | Status: DC

## 2018-01-27 MED ORDER — CYANOCOBALAMIN 100 MCG PO TABS: 1000.0000 ug | ORAL_TABLET | Freq: Every day | ORAL | 3 refills | Status: DC

## 2018-01-27 MED ORDER — LORATADINE 10 MG PO TABS
10.0000 mg | ORAL_TABLET | Freq: Every day | ORAL | Status: DC
  Administered 2018-01-27: 10 mg via ORAL
  Filled 2018-01-27: qty 1

## 2018-01-27 NOTE — Discharge Summary (Addendum)
Physician Discharge Summary  Calvin Santiago QMV:[id number] DOB: 06/05/1945 DOA: 01/20/2018  PCP: London Pepper, MD  Admit date: 01/20/2018 Discharge date: 01/27/2018  Time spent: 45* minutes  Recommendations for Outpatient Follow-up:  1. Follow up PCP in one week 2. Check LFTs in one week 3. Follow up [facility] in 2 weeks 4. Check Depakote level in 2 weeks    Discharge Diagnoses:  Principal Problem:   Major depressive disorder, recurrent episode, severe ([place]) Active Problems:   Panic disorder (episodic paroxysmal anxiety)   Malignant neoplasm of prostate ([place])   Seizure ([place])   Acute metabolic encephalopathy   Altered mental status   Discharge Condition: Stable  Diet recommendation: Regular diet  Filed Weights   01/22/18 0517 01/25/18 0500 01/26/18 0412  Weight: 122.3 kg 120.3 kg 119.6 kg    History of present illness:  72 year old male with a history of severe recurrent major depressive disorder, adenocarcinoma of the prostate, panic disorder, PTSD was brought to the hospital for evaluation of mental status change at home.  Patient was planned to have inpatient psychiatric unit admission when he was noted to have a seizure episode.  Patient was seen by neurology and psychiatry.  Patient was placed on valproic acid, EEG did not show any seizure activity.  MRI showed no acute abnormalities.  Neurology signed off.  Psychiatry has also recommended inpatient psychiatric transfer.    Hospital Course:   1.   Gout flare up-patient has a history of gout and complained of  increased redness, pain and swelling of left foot.  X-ray of the left foot confirm gouty arthritis.  Improved on colchicine.    2. Altered mental status-resolved, patient is back to baseline.  MRI did not show any acute abnormalities.  3. ?  Seizure-patient had questionable seizure in the ED, neurology was consulted patient given valproate.  EEG did not show any epileptiform activity.   Patient will follow-up with neurology as outpatient.  Recommended to check Depakote level in 2 weeks.  4. Vitamin B12 deficiency-continue supplementation.  5. Severe recurrent depression/anxiety-psychiatry has seen the patient and recommend inpatient psychiatric transfer.  Patient and his wife wanted re evaluation by psychiatry. Psychiatry saw the patient and recommended that he could be discharged home.  6. Adenocarcinoma the prostate-currently undergoing radiotherapy at the cancer center.  7. Hypertension-blood pressure stable, continue amlodipine  Procedures:  None   Consultations:  Psychiatry   Discharge Exam: Vitals:   01/26/18 2155 01/27/18 0700  BP: (!) 158/92 132/88  Pulse: 77 76  Resp: 17 18  Temp: 99.4 F (37.4 C) 98.7 F (37.1 C)  SpO2: 95% 96%    General: Appears in no acute distress Cardiovascular: S1S2 RRR Respiratory: Clear bilaterally  Discharge Instructions   Discharge Instructions    Diet - low sodium heart healthy   Complete by:  As directed    Diet - low sodium heart healthy   Complete by:  As directed    Discharge instructions   Complete by:  As directed    1) Follow up with psychiatry. Continue current meds 2)Check Depakote level in 2 weeks.Follow up with neurology as an outpatient in 2 weeks.   Increase activity slowly   Complete by:  As directed    Increase activity slowly   Complete by:  As directed      Allergies as of 01/27/2018      Reactions   Sulfa Antibiotics Other (See Comments)   As a child, he ran into walls  Medication List    STOP taking these medications   buPROPion 150 MG 24 hr tablet Commonly known as:  WELLBUTRIN XL     TAKE these medications   acetaminophen 500 MG tablet Commonly known as:  TYLENOL Take 1,000 mg by mouth every 6 (six) hours as needed for moderate pain.   amLODipine 5 MG tablet Commonly known as:  NORVASC Take 5 mg by mouth daily.   clonazePAM 0.5 MG tablet Commonly known as:   KLONOPIN Take 0.5 mg by mouth 2 (two) times daily.   cyanocobalamin 100 MCG tablet Take 10 tablets (1,000 mcg total) by mouth daily.   divalproex 500 MG DR tablet Commonly known as:  DEPAKOTE Take 1 tablet (500 mg total) by mouth 2 (two) times daily.   gabapentin 300 MG capsule Commonly known as:  NEURONTIN Take 300 mg by mouth 2 (two) times daily.   loratadine 10 MG tablet Commonly known as:  CLARITIN Take 1 tablet (10 mg total) by mouth daily. Start taking on:  01/28/2018   phenylephrine 1 % nasal spray Commonly known as:  NEO-SYNEPHRINE Place 1 drop into both nostrils every 6 (six) hours as needed for congestion.   sertraline 100 MG tablet Commonly known as:  ZOLOFT Take 200 mg by mouth daily.   traZODone 50 MG tablet Commonly known as:  DESYREL Take 50 mg by mouth at bedtime.            Durable Medical Equipment  (From admission, onward)         Start     Ordered   01/25/18 1207  For home use only DME 4 wheeled rolling walker with seat  Once    Question:  Patient needs a walker to treat with the following condition  Answer:  Seizure ([place])   01/25/18 1206         Allergies  Allergen Reactions  . Sulfa Antibiotics Other (See Comments)    As a child, he ran into walls   Follow-up Information    London Pepper, MD. Schedule an appointment as soon as possible for a visit in 1 week(s).   Specialty:  Family Medicine Contact information: [address] [place] [address] [phone]        [facility] Follow up in 2 week(s).   Specialty:  Neurology Contact information: [address] [place] [phone]           The results of significant diagnostics from this hospitalization (including imaging, microbiology, ancillary and laboratory) are listed below for reference.    Significant Diagnostic Studies: Ct Head Wo Contrast  Result Date: 01/21/2018 CLINICAL DATA:   Seizure-like activity.  Prostate cancer. EXAM: CT HEAD WITHOUT CONTRAST TECHNIQUE: Contiguous axial images were obtained from the base of the skull through the vertex without intravenous contrast. COMPARISON:  05/30/2017 FINDINGS: Brain: Cerebral and cerebellar atrophy. No mass lesion, hemorrhage, hydrocephalus, acute infarct, intra-axial, or extra-axial fluid collection. Vascular: No hyperdense vessel or unexpected calcification. Skull: Normal Sinuses/Orbits: Normal imaged portions of the orbits and globes. Clear paranasal sinuses and mastoid air cells. Other: None. IMPRESSION: No acute intracranial abnormality. Electronically Signed   By: Abigail Miyamoto M.D.   On: 01/21/2018 07:31   Mr Brain Wo Contrast  Result Date: 01/22/2018 CLINICAL DATA:  Altered mental status. History of prostate cancer and severe recurrent depression. EXAM: MRI HEAD WITHOUT CONTRAST TECHNIQUE: Multiplanar, multiecho pulse sequences of the brain and surrounding structures were obtained without intravenous  contrast. COMPARISON:  CT HEAD January 21, 2018 FINDINGS: INTRACRANIAL CONTENTS: No reduced diffusion to suggest acute ischemia. No susceptibility artifact to suggest hemorrhage. The ventricles and sulci are normal for patient's age. No suspicious parenchymal signal, masses, mass effect. No abnormal extra-axial fluid collections. No extra-axial masses. VASCULAR: Normal major intracranial vascular flow voids present at skull base. SKULL AND UPPER CERVICAL SPINE: No abnormal sellar expansion. No suspicious calvarial bone marrow signal. Craniocervical junction maintained. SINUSES/ORBITS: The mastoid air-cells and included paranasal sinuses are well-aerated.The included ocular globes and orbital contents are non-suspicious. OTHER: Life support lines in place with layering secretions in pharynx. IMPRESSION: Normal noncontrast MRI head for age. Electronically Signed   By: Elon Alas M.D.   On: 01/22/2018 17:42   Dg Foot 2 Views  Left  Result Date: 01/25/2018 CLINICAL DATA:  Left foot swelling and pain dorsally. History of gout EXAM: LEFT FOOT - 2 VIEW COMPARISON:  None. FINDINGS: Degenerative changes noted of the left foot first and second MTP joints and the first toe interphalangeal joint. These areas demonstrate joint space loss, sclerosis, irregularity and bony spurring. Small erosions suspected of the left first toe interphalangeal joint. Findings may represent osteoarthritis or gout arthritis. No acute osseous finding, fracture, malalignment. No soft tissue abnormality. No focal swelling. IMPRESSION: Degenerative arthritis of the left foot as above compatible with osteoarthritis versus gout arthritis. No acute osseous finding or fracture. Electronically Signed   By: Jerilynn Mages.  Shick M.D.   On: 01/25/2018 14:27    Microbiology: No results found for this or any previous visit (from the past 240 hour(s)).   Labs: Basic Metabolic Panel: Recent Labs  Lab 01/21/18 0635 01/21/18 0644 01/21/18 1055 01/22/18 0425  NA 139 140  --  141  K 3.4* 3.8  --  3.6  CL 100 105  --  106  CO2 16*  --   --  24  GLUCOSE 158* 153*  --  112*  BUN 23 28*  --  21  CREATININE 1.07 0.90 0.79 0.70  CALCIUM 9.8  --   --  8.8*   Liver Function Tests: Recent Labs  Lab 01/22/18 0425  AST 23  ALT 18  ALKPHOS 69  BILITOT 0.6  PROT 7.5  ALBUMIN 4.1   No results for input(s): LIPASE, AMYLASE in the last 168 hours. Recent Labs  Lab 01/21/18 2253  AMMONIA 31   CBC: Recent Labs  Lab 01/21/18 0635 01/21/18 0644 01/21/18 1055 01/22/18 0425  WBC 13.4*  --  10.4 10.2  NEUTROABS 10.1*  --   --   --   HGB 15.5 15.0 13.6 14.0  HCT 50.2 44.0 42.3  46.9 43.1  MCV 93.1  --  89.4 87.4  PLT 285  --  240 224    CBG: Recent Labs  Lab 01/26/18 1156 01/26/18 1730 01/26/18 2153 01/27/18 0729 01/27/18 1211  GLUCAP 111* 164* 91 132* 150*       Signed:  Oswald Hillock MD.  [facility] 01/27/2018, 2:34 PM

## 2018-01-27 NOTE — Consult Note (Signed)
[facility] Psych Consult Progress Note  01/27/2018 12:02 PM Calvin Santiago  MRN:  [id number] Subjective:   Calvin Santiago was last seen on 11/22 and recommended for inpatient psychiatric hospitalization due to continued bizarre behavior and otherwise unremarkable workup. He was started on Depakote after he was observed to have a seizure in the ED. He was seen by the primary team yesterday and was noted to have improvement in his mental status. The patient and his wife both report that he is doing better. He would like to follow up with his outpatient psychiatrist, Dr. Darleene Cleaver. He has an appointment on 12/10.   On interview, Calvin Santiago reports doing well.  He realizes that he must reduce his stressors and therefore make changes in his life.  He reports that his wife and brother are very supportive.  His wife is at bedside with his verbal consent and she agrees to help him with this process.  She reports that he is at his baseline and she does not have any concerns for his safety.  He denies SI, HI or AVH.  He denies problems with sleep or appetite.  He plans to follow up with his outpatient psychiatrist.  Principal Problem: Acute stress reaction Diagnosis:  Principal Problem:   Major depressive disorder, recurrent episode, severe (HCC) Active Problems:   Panic disorder (episodic paroxysmal anxiety)   Malignant neoplasm of prostate (HCC)   Seizure ([place])   Acute metabolic encephalopathy   Altered mental status  Total Time spent with patient: 15 minutes  Past Psychiatric History: MDD and anxiety with panic attacks.  Past Medical History:  Past Medical History:  Diagnosis Date  . Anxiety   . Bipolar 2 disorder ([place])   . Prostate cancer [facility])     Past Surgical History:  Procedure Laterality Date  . PROSTATE BIOPSY    . RADIOLOGY WITH ANESTHESIA N/A 01/22/2018   Procedure: MRI of the brain WITH ANESTHESIA;  Surgeon: Radiologist, Medication, MD;  Location: [facility];  Service: Radiology;  Laterality: N/A;  .  REPLACEMENT TOTAL KNEE     bilateral   Family History:  Family History  Problem Relation Age of Onset  . Prostate cancer Father   . Prostate cancer Brother   . Prostate cancer Maternal Grandfather   . Breast cancer Neg Hx   . Pancreatic cancer Neg Hx   . Colon cancer Neg Hx    Family Psychiatric  History: Denies  Social History:  Social History   Substance and Sexual Activity  Alcohol Use Never  . Frequency: Never     Social History   Substance and Sexual Activity  Drug Use Never    Social History   Socioeconomic History  . Marital status: Married    Spouse name: Not on file  . Number of children: 1  . Years of education: Not on file  . Highest education level: Not on file  Occupational History  . Not on file  Social Needs  . Financial resource strain: Not on file  . Food insecurity:    Worry: Not on file    Inability: Not on file  . Transportation needs:    Medical: Not on file    Non-medical: Not on file  Tobacco Use  . Smoking status: Never Smoker  . Smokeless tobacco: Never Used  Substance and Sexual Activity  . Alcohol use: Never    Frequency: Never  . Drug use: Never  . Sexual activity: Not Currently  Lifestyle  . Physical activity:  Days per week: Not on file    Minutes per session: Not on file  . Stress: Not on file  Relationships  . Social connections:    Talks on phone: Not on file    Gets together: Not on file    Attends religious service: Not on file    Active member of club or organization: Not on file    Attends meetings of clubs or organizations: Not on file    Relationship status: Not on file  Other Topics Concern  . Not on file  Social History Narrative   12-17-17 Unable to ask abuse questions wife with with him today.    Sleep: Good  Appetite:  Good  Current Medications: Current Facility-Administered Medications  Medication Dose Route Frequency Provider Last Rate Last Dose  . acetaminophen (TYLENOL) tablet 650 mg  650  mg Oral Q6H PRN Amin, Ankit Chirag, MD       Or  . acetaminophen (TYLENOL) suppository 650 mg  650 mg Rectal Q6H PRN Amin, Ankit Chirag, MD      . acetaminophen (TYLENOL) tablet 1,000 mg  1,000 mg Oral Q6H PRN Oswald Hillock, MD   1,000 mg at 01/25/18 2128  . amLODipine (NORVASC) tablet 5 mg  5 mg Oral Daily Amin, Ankit Chirag, MD   5 mg at 01/27/18 1000  . bisacodyl (DULCOLAX) EC tablet 5 mg  5 mg Oral Daily PRN Amin, Ankit Chirag, MD      . calcium carbonate (TUMS - dosed in mg elemental calcium) chewable tablet 200 mg of elemental calcium  1 tablet Oral TID PRN Oswald Hillock, MD      . clonazePAM Bobbye [place]) tablet 0.5 mg  0.5 mg Oral BID Amin, Ankit Chirag, MD   0.5 mg at 01/27/18 1001  . colchicine tablet 0.6 mg  0.6 mg Oral BID Oswald Hillock, MD   0.6 mg at 01/27/18 1000  . divalproex (DEPAKOTE SPRINKLE) capsule 500 mg  500 mg Oral Q12H Adhikari, Amrit, MD   500 mg at 01/27/18 1158  . enoxaparin (LOVENOX) injection 40 mg  40 mg Subcutaneous Q24H Amin, Ankit Chirag, MD   40 mg at 01/26/18 2138  . gabapentin (NEURONTIN) capsule 300 mg  300 mg Oral BID Amin, Ankit Chirag, MD   300 mg at 01/27/18 1000  . insulin aspart (novoLOG) injection 0-9 Units  0-9 Units Subcutaneous TID WC Amin, Ankit Chirag, MD   1 Units at 01/25/18 1320  . lip balm (CARMEX) ointment   Topical PRN Oswald Hillock, MD      . loratadine (CLARITIN) tablet 10 mg  10 mg Oral Daily Oswald Hillock, MD   10 mg at 01/27/18 1117  . phenylephrine (NEO-SYNEPHRINE) 1 % nasal drops 1 drop  1 drop Each Nare Q6H PRN Amin, Ankit Chirag, MD      . senna-docusate (Senokot-S) tablet 1 tablet  1 tablet Oral QHS PRN Amin, Ankit Chirag, MD      . sertraline (ZOLOFT) tablet 200 mg  200 mg Oral Daily Amin, Ankit Chirag, MD   200 mg at 01/27/18 1000  . traMADol (ULTRAM) tablet 50 mg  50 mg Oral Q6H PRN Shelly Coss, MD   50 mg at 01/26/18 2138  . vitamin B-12 (CYANOCOBALAMIN) tablet 100 mcg  100 mcg Oral Daily Shelly Coss, MD   100 mcg at  01/27/18 1000    Lab Results:  Results for orders placed or performed during the hospital encounter of 01/20/18 (from the past  48 hour(s))  Glucose, capillary     Status: None   Collection Time: 01/25/18  4:53 PM  Result Value Ref Range   Glucose-Capillary 92 70 - 99 mg/dL  Glucose, capillary     Status: None   Collection Time: 01/25/18  9:24 PM  Result Value Ref Range   Glucose-Capillary 99 70 - 99 mg/dL  Glucose, capillary     Status: None   Collection Time: 01/26/18  7:42 AM  Result Value Ref Range   Glucose-Capillary 74 70 - 99 mg/dL   Comment 1 Notify RN    Comment 2 Document in Chart   Glucose, capillary     Status: Abnormal   Collection Time: 01/26/18 11:56 AM  Result Value Ref Range   Glucose-Capillary 111 (H) 70 - 99 mg/dL   Comment 1 Notify RN    Comment 2 Document in Chart   Glucose, capillary     Status: Abnormal   Collection Time: 01/26/18  5:30 PM  Result Value Ref Range   Glucose-Capillary 164 (H) 70 - 99 mg/dL   Comment 1 Notify RN    Comment 2 Document in Chart   Glucose, capillary     Status: None   Collection Time: 01/26/18  9:53 PM  Result Value Ref Range   Glucose-Capillary 91 70 - 99 mg/dL    Blood Alcohol level:  Lab Results  Component Value Date   ETH <10 01/20/2018   ETH <5 09/13/2016     Musculoskeletal: Strength & Muscle Tone: within normal limits Gait & Station: UTA since patient is lying in bed. Patient leans: N/A  Psychiatric Specialty Exam: Physical Exam  Nursing note and vitals reviewed. Constitutional: He is oriented to person, place, and time. He appears well-developed and well-nourished.  HENT:  Head: Normocephalic and atraumatic.  Neck: Normal range of motion.  Respiratory: Effort normal.  Musculoskeletal: Normal range of motion.  Neurological: He is alert and oriented to person, place, and time.  Psychiatric: He has a normal mood and affect. His speech is normal and behavior is normal. Judgment and thought content  normal. Cognition and memory are normal.    Review of Systems  Cardiovascular: Negative for chest pain.  Gastrointestinal: Negative for abdominal pain, constipation, diarrhea, nausea and vomiting.  Psychiatric/Behavioral: Negative for depression, hallucinations, substance abuse and suicidal ideas. The patient does not have insomnia.   All other systems reviewed and are negative.   Blood pressure 132/88, pulse 76, temperature 98.7 F (37.1 C), temperature source Oral, resp. rate 18, height 6' 1.5" (1.867 m), weight 119.6 kg, SpO2 96 %.Body mass index is 34.32 kg/m.  General Appearance: Fairly Groomed, elderly, Caucasian male, wearing a hospital gown and lying in bed. NAD.   Eye Contact:  Good  Speech:  Clear and Coherent and Normal Rate  Volume:  Normal  Mood:  Euthymic  Affect:  Appropriate and Congruent  Thought Process:  Goal Directed, Linear and Descriptions of Associations: Intact  Orientation:  Full (Time, Place, and Person)  Thought Content:  Logical  Suicidal Thoughts:  No  Homicidal Thoughts:  No  Memory:  Immediate;   Good Recent;   Good Remote;   Good  Judgement:  Good  Insight:  Good  Psychomotor Activity:  Normal  Concentration:  Concentration: Good and Attention Span: Good  Recall:  Good  Fund of Knowledge:  Good  Language:  Good  Akathisia:  No  Handed:  Right  AIMS (if indicated):   N/A  Assets:  Communication Skills  Desire for Improvement Housing Intimacy Resilience Social Support  ADL's:  Intact  Cognition:  WNL  Sleep:   Okay    Assessment:  Calvin Santiago is a 72 y.o. male who was admitted with altered mental status and bizarre behavior in the setting of recently visiting his ill mother. He was witnessed to have a generalized seizure and was admitted to the medicine service. His mood and mental status have significantly improved. He denies SI, HI or AVH. His wife also feels like he is at his baseline. He does not warrant inpatient psychiatric  hospitalization at this time. He will follow up with his outpatient provider.    Treatment Plan Summary: Continue Zoloft 200 mg daily for depression and anxiety. -Continue Gabapentin 300 mg BID for anxiety. -Continue Klonopin 0.5 mg BID for anxiety. This medication is currently being tapered to discontinuation by his outpatient doctor.  -Continue Depakote 500 mg BID for mood stabilization/seizure prophylaxis.  -Follow up with outpatient provider for medication management.  -Psychiatry will sign off on patient at this time. Please consult psychiatry again as needed.    Faythe Dingwall, DO 01/27/2018, 12:02 PM

## 2018-02-02 ENCOUNTER — Emergency Department (HOSPITAL_COMMUNITY)
Admission: EM | Admit: 2018-02-02 | Discharge: 2018-02-03 | Disposition: A | Discharge status: Other Acute Inpatient Hospital | Payer: Medicare Other | Attending: Emergency Medicine | Admitting: Emergency Medicine

## 2018-02-02 ENCOUNTER — Encounter (HOSPITAL_COMMUNITY): Payer: Self-pay

## 2018-02-02 ENCOUNTER — Other Ambulatory Visit: Payer: Self-pay

## 2018-02-02 DIAGNOSIS — R4182 Altered mental status, unspecified: Secondary | ICD-10-CM | POA: Diagnosis not present

## 2018-02-02 DIAGNOSIS — F319 Bipolar disorder, unspecified: Secondary | ICD-10-CM | POA: Insufficient documentation

## 2018-02-02 DIAGNOSIS — Z79899 Other long term (current) drug therapy: Secondary | ICD-10-CM | POA: Insufficient documentation

## 2018-02-02 DIAGNOSIS — F29 Unspecified psychosis not due to a substance or known physiological condition: Secondary | ICD-10-CM

## 2018-02-02 DIAGNOSIS — F4311 Post-traumatic stress disorder, acute: Secondary | ICD-10-CM | POA: Diagnosis not present

## 2018-02-02 DIAGNOSIS — R443 Hallucinations, unspecified: Secondary | ICD-10-CM | POA: Diagnosis not present

## 2018-02-02 DIAGNOSIS — R9431 Abnormal electrocardiogram [ECG] [EKG]: Secondary | ICD-10-CM | POA: Diagnosis not present

## 2018-02-02 DIAGNOSIS — Z8546 Personal history of malignant neoplasm of prostate: Secondary | ICD-10-CM | POA: Diagnosis not present

## 2018-02-02 LAB — TROPONIN I: Troponin I: <0.03 ng/mL (ref <0.03)

## 2018-02-02 LAB — COMPREHENSIVE METABOLIC PANEL
ALBUMIN: 4 g/dL (ref 3.5–5.0)
ALT: 28 U/L (ref 0–44)
AST: 26 U/L (ref 15–41)
Alkaline Phosphatase: 79 U/L (ref 38–126)
Anion gap: 12 (ref 5–15)
BUN: 21 mg/dL (ref 8–23)
CO2: 23 mmol/L (ref 22–32)
Calcium: 9.1 mg/dL (ref 8.9–10.3)
Chloride: 107 mmol/L (ref 98–111)
Creatinine, Ser: 1.24 mg/dL (ref 0.61–1.24)
GFR calc Af Amer: >60 mL/min (ref >60)
GFR calc non Af Amer: 57 mL/min — ABNORMAL LOW (ref >60)
Glucose, Bld: 143 mg/dL — ABNORMAL HIGH (ref 70–99)
Potassium: 3.9 mmol/L (ref 3.5–5.1)
Sodium: 142 mmol/L (ref 135–145)
Total Bilirubin: 0.7 mg/dL (ref 0.3–1.2)
Total Protein: 7.1 g/dL (ref 6.5–8.1)

## 2018-02-02 LAB — CBC WITH DIFFERENTIAL/PLATELET
ABS IMMATURE GRANULOCYTES: 0.09 10*3/uL — AB (ref 0.00–0.07)
Basophils Absolute: 0 10*3/uL (ref 0.0–0.1)
Basophils Relative: 0 %
EOS PCT: 0 %
Eosinophils Absolute: 0 10*3/uL (ref 0.0–0.5)
HEMATOCRIT: 47.6 % (ref 39.0–52.0)
HEMOGLOBIN: 14.7 g/dL (ref 13.0–17.0)
Immature Granulocytes: 1 %
LYMPHS ABS: 0.6 10*3/uL — AB (ref 0.7–4.0)
LYMPHS PCT: 6 %
MCH: 28.1 pg (ref 26.0–34.0)
MCHC: 30.9 g/dL (ref 30.0–36.0)
MCV: 91 fL (ref 80.0–100.0)
Monocytes Absolute: 0.8 10*3/uL (ref 0.1–1.0)
Monocytes Relative: 8 %
NEUTROS ABS: 8.2 10*3/uL — AB (ref 1.7–7.7)
NRBC: 0 % (ref 0.0–0.2)
Neutrophils Relative %: 85 %
Platelets: 237 10*3/uL (ref 150–400)
RBC: 5.23 MIL/uL (ref 4.22–5.81)
RDW: 13.8 % (ref 11.5–15.5)
WBC: 9.7 10*3/uL (ref 4.0–10.5)

## 2018-02-02 LAB — ETHANOL: Alcohol, Ethyl (B): <10 mg/dL (ref <10)

## 2018-02-02 LAB — I-STAT TROPONIN, ED: Troponin i, poc: 0.01 ng/mL (ref 0.00–0.08)

## 2018-02-02 LAB — CBG MONITORING, ED: Glucose-Capillary: 159 mg/dL — ABNORMAL HIGH (ref 70–99)

## 2018-02-02 MED ORDER — LORAZEPAM 2 MG/ML IJ SOLN
INTRAMUSCULAR | Status: AC
  Administered 2018-02-02: 2 mg via INTRAMUSCULAR
  Filled 2018-02-02: qty 1

## 2018-02-02 MED ORDER — STERILE WATER FOR INJECTION IJ SOLN
INTRAMUSCULAR | Status: AC
  Administered 2018-02-02: 20:00:00
  Filled 2018-02-02: qty 10

## 2018-02-02 MED ORDER — ZIPRASIDONE MESYLATE 20 MG IM SOLR
INTRAMUSCULAR | Status: AC
  Administered 2018-02-02: 10 mg via INTRAMUSCULAR
  Filled 2018-02-02: qty 20

## 2018-02-02 NOTE — ED Notes (Signed)
Pt extremely aggitated, thinking he is in [place].  Pt diaphoretic, unable to get vital signs on pt due to violent behavior.  Dr Cheral Bay to bedside, orders given

## 2018-02-02 NOTE — ED Notes (Signed)
Bed: WA27 Expected date:  Expected time:  Means of arrival:  Comments: triage

## 2018-02-02 NOTE — ED Triage Notes (Signed)
Per pts brother pt has been hallucinating and yelling like he is back in [place]. He is very aggitated with security presence, yellling " I dont like those young guys, they are the pilots, they have the guns"  EDP viewed patient, meds ordered

## 2018-02-02 NOTE — BH Assessment (Signed)
[facility] Assessment Progress Note   Pt was given ativan and geodon at 20:18.  Clinician talked with RN Colletta [place] who said that patient is very sleepy still.  He will be assessed later when patient is alert and oriented.

## 2018-02-02 NOTE — ED Notes (Signed)
Bed: WA20 Expected date:  Expected time:  Means of arrival:  Comments: Hold for TCU 27

## 2018-02-02 NOTE — ED Notes (Signed)
Pt remains diaphoretic, sleeping in bed. Dr Cheral Bay to bedside due to low BP and O2 sat.  Pt moved out to main ED side Rm 20, placed on O2 and IV #20 ga started left wrist x 1 attempt. 500cc bolus NS infusing. Pt on cardiac monitor

## 2018-02-02 NOTE — ED Provider Notes (Signed)
[facility] DEPT Provider Note   CSN: [id number] Arrival date & time: 02/02/18  1850     History   Chief Complaint Chief Complaint  Patient presents with  . Psychiatric Evaluation    hallucinations    HPI Calvin Santiago is a 72 y.o. male.  HPI Patient was recently released from inpatient psychiatric facility.  Family states of the last 5 days he has become increasingly agitated, delusional and having hallucinations.  Worsened this evening.  Patient is difficult to redirect and unable to contribute to history.  Level 5 caveat applies. Past Medical History:  Diagnosis Date  . Anxiety   . Bipolar 2 disorder ([place])   . Prostate cancer [facility])     Patient Active Problem List   Diagnosis Date Noted  . Acute stress reaction   . Seizure ([place]) 01/21/2018  . Acute metabolic encephalopathy 76/28/3151  . Altered mental status 01/21/2018  . Malignant neoplasm of prostate ([place]) 07/31/2017  . Panic disorder (episodic paroxysmal anxiety) 09/14/2016  . Major depressive disorder, recurrent episode, severe ([place]) 09/14/2016    Past Surgical History:  Procedure Laterality Date  . PROSTATE BIOPSY    . RADIOLOGY WITH ANESTHESIA N/A 01/22/2018   Procedure: MRI of the brain WITH ANESTHESIA;  Surgeon: Radiologist, Medication, MD;  Location: [place];  Service: Radiology;  Laterality: N/A;  . REPLACEMENT TOTAL KNEE     bilateral        Home Medications    Prior to Admission medications   Medication Sig Start Date End Date Taking? Authorizing Provider  amLODipine (NORVASC) 5 MG tablet Take 5 mg by mouth daily. 12/13/17  Yes [provider]  clonazePAM (KLONOPIN) 0.5 MG tablet Take 0.5 mg by mouth 2 (two) times daily.  05/06/17  Yes [provider]  divalproex (DEPAKOTE) 500 MG DR tablet Take 1 tablet (500 mg total) by mouth 2 (two) times daily. 01/27/18 05/27/18 Yes Oswald Hillock, MD  gabapentin (NEURONTIN) 300 MG capsule Take 300 mg by mouth 2 (two)  times daily.  05/13/17  Yes [provider]  loratadine (CLARITIN) 10 MG tablet Take 1 tablet (10 mg total) by mouth daily. 01/28/18  Yes Oswald Hillock, MD  sertraline (ZOLOFT) 100 MG tablet Take 200 mg by mouth daily.    Yes [provider]  traZODone (DESYREL) 50 MG tablet Take 50 mg by mouth at bedtime. 11/11/17  Yes [provider]  vitamin B-12 (CYANOCOBALAMIN) 1000 MCG tablet Take 1 tablet (1,000 mcg total) by mouth daily. 01/27/18  Yes Oswald Hillock, MD  acetaminophen (TYLENOL) 500 MG tablet Take 1,000 mg by mouth every 6 (six) hours as needed for moderate pain.    [provider]  phenylephrine (NEO-SYNEPHRINE) 1 % nasal spray Place 1 drop into both nostrils every 6 (six) hours as needed for congestion.    [provider]    Family History Family History  Problem Relation Age of Onset  . Prostate cancer Father   . Prostate cancer Brother   . Prostate cancer Maternal Grandfather   . Breast cancer Neg Hx   . Pancreatic cancer Neg Hx   . Colon cancer Neg Hx     Social History Social History   Tobacco Use  . Smoking status: Never Smoker  . Smokeless tobacco: Never Used  Substance Use Topics  . Alcohol use: Never    Frequency: Never  . Drug use: Never     Allergies   Sulfa antibiotics   Review  of Systems Review of Systems  Unable to perform ROS: Mental status change     Physical Exam Updated Vital Signs BP (!) 131/94   Pulse (!) 101   Temp 99.8 F (37.7 C)   Resp 18   SpO2 93%   Physical Exam  Constitutional: He appears well-developed.  HENT:  Head: Normocephalic and atraumatic.  Mouth/Throat: No oropharyngeal exudate.  Eyes: Pupils are equal, round, and reactive to light. Conjunctivae and EOM are normal.  Neck: Normal range of motion. Neck supple. No JVD present.  Cardiovascular: Normal rate. Exam reveals no gallop and no friction rub.  No murmur heard. Pulmonary/Chest: Effort normal and breath sounds normal.  No stridor. No respiratory distress. He has no wheezes. He has no rales. He exhibits no tenderness.  Abdominal: Soft. Bowel sounds are normal. There is no tenderness. There is no rebound and no guarding.  Musculoskeletal: Normal range of motion. He exhibits no edema or tenderness.  Lymphadenopathy:    He has no cervical adenopathy.  Neurological: He is alert.  Moving all extremities without focal deficit.  Sensation intact.  Skin: Skin is warm and dry. No rash noted. No erythema.  Psychiatric:  Agitated.  Pressured speech.  Visual hallucinations.  Nursing note and vitals reviewed.    ED Treatments / Results  Labs (all labs ordered are listed, but only abnormal results are displayed) Labs Reviewed  CBC WITH DIFFERENTIAL/PLATELET - Abnormal; Notable for the following components:      Result Value   Neutro Abs 8.2 (*)    Lymphs Abs 0.6 (*)    Abs Immature Granulocytes 0.09 (*)    All other components within normal limits  COMPREHENSIVE METABOLIC PANEL - Abnormal; Notable for the following components:   Glucose, Bld 143 (*)    GFR calc non Af Amer 57 (*)    All other components within normal limits  RAPID URINE DRUG SCREEN, HOSP PERFORMED - Abnormal; Notable for the following components:   Benzodiazepines POSITIVE (*)    All other components within normal limits  URINALYSIS, ROUTINE W REFLEX MICROSCOPIC - Abnormal; Notable for the following components:   Ketones, ur 5 (*)    Protein, ur 30 (*)    All other components within normal limits  CBG MONITORING, ED - Abnormal; Notable for the following components:   Glucose-Capillary 159 (*)    All other components within normal limits  ETHANOL  TROPONIN I  I-STAT TROPONIN, ED    EKG EKG Interpretation  Date/Time:  Monday February 02 2018 20:44:11 EST Ventricular Rate:  96 PR Interval:  152 QRS Duration: 88 QT Interval:  342 QTC Calculation: 432 R Axis:     Text Interpretation:  Normal sinus rhythm Inferior infarct , age  undetermined Abnormal ECG Confirmed by Julianne Rice [phone]) on 02/02/2018 10:35:08 PM   Radiology No results found.  Procedures Procedures (including critical care time)  Medications Ordered in ED Medications  ziprasidone (GEODON) 20 MG injection (10 mg Intramuscular Given 02/02/18 2019)  sterile water (preservative free) injection (  Push 02/02/18 2020)  LORazepam (ATIVAN) 2 MG/ML injection (2 mg Intramuscular Given 02/02/18 2018)  sterile water (preservative free) injection (  Given 02/02/18 2020)     Initial Impression / Assessment and Plan / ED Course  I have reviewed the triage vital signs and the nursing notes.  Pertinent labs & imaging results that were available during my care of the patient were reviewed by me and considered in my medical decision making (see chart for  details).    Patient appears acutely psychotic.  Will give IM sedation and reassess.   Given IM Geodon and Ativan.  Patient is now somnolent.  Mild hypoxia and hypotension.  Given IV fluids and placed on supplemental oxygen.  We will continue to observe closely.  Patient is now more awake and alert.  Maintaining saturations off oxygen.  Will have TTS consult. Final Clinical Impressions(s) / ED Diagnoses   Final diagnoses:  Altered mental status, unspecified altered mental status type    ED Discharge Orders    None       Julianne Rice, MD 02/05/18 1513

## 2018-02-02 NOTE — ED Notes (Addendum)
Per pts wife, Calvin Calvin , 3 weeks ago pt had severe panic attack after seeing mother who is dying.  Pt was in [place] last year for severe panic attack after seeing mother . Dr told pt not to see her again because she was a trigger.  Pt lied to family that he was allowed to see mother again, so he visited her.  3 days after visiting mother pt began having the same signs, incoherent episodes.  Pt saw Dr A. Last Monday a week ago.  At that time pt had a short incoherent episode for 45 minutes but snapped out of it in the dr. Gabriel Carina while speaking to Dr A.   Dr. Rockey Situ patient to go immediately to [facility].  Pt was incoherent again on Tuesday morning so was brought to [facility].  [facility] Dr Mariea Clonts  discharged him the following Tuesday 11/26  because he snapped out of it and was lucid.  Pt went home with wife.  He slowly became incoherent again, ate thanksgiving meal with his hands, hallucinating.  Today he was hallucinating about [place], yelling, paranoid, doesn't recognize wife or family.   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 Pt has written a 18-20 incoherent notes saying he thinks he is dying and is afraid of dying  Psych Provider Please call wife Calvin Calvin in the am after seeing patient.  [phone]

## 2018-02-02 NOTE — ED Notes (Signed)
Condom cath placed on pt to try and obtain a urine specimen

## 2018-02-02 NOTE — ED Notes (Signed)
Per pt is is ok to share PHI with pts Brother and wife

## 2018-02-02 NOTE — ED Notes (Signed)
Pt's belongings taken from locker 27 and placed at nurses station (cabinets 16-18/Res A) outside of Rm 20. Pt placed on cardiac monitoring.

## 2018-02-03 ENCOUNTER — Emergency Department (HOSPITAL_COMMUNITY): Payer: Medicare Other

## 2018-02-03 DIAGNOSIS — F4311 Post-traumatic stress disorder, acute: Secondary | ICD-10-CM | POA: Diagnosis not present

## 2018-02-03 DIAGNOSIS — F209 Schizophrenia, unspecified: Secondary | ICD-10-CM | POA: Diagnosis not present

## 2018-02-03 DIAGNOSIS — Z8546 Personal history of malignant neoplasm of prostate: Secondary | ICD-10-CM | POA: Diagnosis not present

## 2018-02-03 DIAGNOSIS — R443 Hallucinations, unspecified: Secondary | ICD-10-CM | POA: Diagnosis not present

## 2018-02-03 DIAGNOSIS — Z79899 Other long term (current) drug therapy: Secondary | ICD-10-CM | POA: Diagnosis not present

## 2018-02-03 DIAGNOSIS — F319 Bipolar disorder, unspecified: Secondary | ICD-10-CM | POA: Diagnosis not present

## 2018-02-03 DIAGNOSIS — I10 Essential (primary) hypertension: Secondary | ICD-10-CM | POA: Diagnosis present

## 2018-02-03 DIAGNOSIS — F0390 Unspecified dementia without behavioral disturbance: Secondary | ICD-10-CM | POA: Diagnosis present

## 2018-02-03 DIAGNOSIS — F333 Major depressive disorder, recurrent, severe with psychotic symptoms: Secondary | ICD-10-CM | POA: Diagnosis present

## 2018-02-03 LAB — RAPID URINE DRUG SCREEN, HOSP PERFORMED
Amphetamines: NOT DETECTED
Barbiturates: NOT DETECTED
Benzodiazepines: POSITIVE — AB
Cocaine: NOT DETECTED
Opiates: NOT DETECTED
TETRAHYDROCANNABINOL: NOT DETECTED

## 2018-02-03 LAB — URINALYSIS, ROUTINE W REFLEX MICROSCOPIC
Bacteria, UA: NONE SEEN
Bilirubin Urine: NEGATIVE
Glucose, UA: NEGATIVE mg/dL
Hgb urine dipstick: NEGATIVE
Ketones, ur: 5 mg/dL — AB
LEUKOCYTES UA: NEGATIVE
Nitrite: NEGATIVE
PROTEIN: 30 mg/dL — AB
Specific Gravity, Urine: 1.018 (ref 1.005–1.030)
pH: 7 (ref 5.0–8.0)

## 2018-02-03 NOTE — ED Notes (Signed)
Patient transported to X-ray 

## 2018-02-03 NOTE — ED Notes (Signed)
Pt returned from xray at this time. Per Dorisann Frames- Pt is recommended for geri-psych placement. [facility] working on placement on this time.

## 2018-02-03 NOTE — BH Assessment (Signed)
[facility] Assessment Progress Note  Per Buford Dresser, DO, this pt requires psychiatric hospitalization at this time.  Pt presents under IVC initiated by EDP Julianne Rice, MD.  The following facilities have been contacted to seek placement for this pt, with results as noted:  Beds available, information sent, decision pending:  [facility]   At capacity:  [facility] [place] Coordinator [phone]

## 2018-02-03 NOTE — ED Notes (Signed)
Pt ambulated out with sheriffs department without difficulty. Environmental manager provided with [place] and IVC paper work.

## 2018-02-03 NOTE — BH Assessment (Signed)
Assessment Note  Calvin Santiago is a 72 y.o. male in [facility] due to hallucinations, paranoia and general AMS. Pt was d/c from the ED last week after a 7 day stay for same. Per his wife, after d/c, pt slowly decompensated to the point where he became incoherent, ate thanksgiving meal with his hands, was hallucinating about [place], was yelling, paranoid, and didn't recognize his wife or family. Pt also wrote 18-20 incoherent notes about being afraid of dying and thinking he's going to die. Pt was bought to the ED and had to subsequently be chemically restrained with Geodon and Ativan last night.  Pt is very pleasant and conversational this AM during TTS assessment. He is garrulous and tangential. He is not able to verbalize (or he doesn't remember) what happened to him to cause him to be in the hospital.   Case staffed with Dr. Mariea Clonts & Sheran Fava, [place]. Pt is recommended for gero-psych due to his rapid mental decline directly after being d/c from the ED last time.   Diagnosis: F43.11 PTSD, acute  Past Medical History:  Past Medical History:  Diagnosis Date  . Anxiety   . Bipolar 2 disorder ([place])   . Prostate cancer [facility])     Past Surgical History:  Procedure Laterality Date  . PROSTATE BIOPSY    . RADIOLOGY WITH ANESTHESIA N/A 01/22/2018   Procedure: MRI of the brain WITH ANESTHESIA;  Surgeon: Radiologist, Medication, MD;  Location: [place];  Service: Radiology;  Laterality: N/A;  . REPLACEMENT TOTAL KNEE     bilateral    Family History:  Family History  Problem Relation Age of Onset  . Prostate cancer Father   . Prostate cancer Brother   . Prostate cancer Maternal Grandfather   . Breast cancer Neg Hx   . Pancreatic cancer Neg Hx   . Colon cancer Neg Hx     Social History:  reports that he has never smoked. He has never used smokeless tobacco. He reports that he does not drink alcohol or use drugs.  Additional Social History:  Alcohol / Drug Use Pain Medications: See  MAR Prescriptions: See MAR Over the Counter: See MAR History of alcohol / drug use?: No history of alcohol / drug abuse  CIWA: CIWA-Ar BP: 134/80 Pulse Rate: 81 COWS:    Allergies:  Allergies  Allergen Reactions  . Sulfa Antibiotics Other (See Comments)    As a child, he ran into walls    Home Medications:  (Not in a hospital admission)  OB/GYN Status:  No LMP for male patient.  General Assessment Data Assessment unable to be completed: Yes Reason for not completing assessment: Ativan and Geodon given at 20:18 [facility] T[facility] Assessment: In system Is this a Tele or Face-to-Face Assessment?: Face-to-Face Is this an Initial Assessment or a Re-assessment for this encounter?: Initial Assessment Patient Accompanied by:: N/A Language Other than English: No Living Arrangements: Other (Comment) What gender do you identify as?: Male Marital status: Married Living Arrangements: Spouse/significant other Can pt return to current living arrangement?: Yes Admission Status: Voluntary Is patient capable of signing voluntary admission?: Yes Referral Source: Self/Family/Friend     Crisis Care Plan Living Arrangements: Spouse/significant other Name of Psychiatrist: Dr. Darleene Cleaver Name of Therapist: n/a  Education Status Is patient currently in school?: No Is the patient employed, unemployed or receiving disability?: Receiving disability income, Unemployed  Risk to self with the past 6 months Suicidal Ideation: No Has patient been a risk to self within  the past 6 months prior to admission? : No Suicidal Intent: No Has patient had any suicidal intent within the past 6 months prior to admission? : No Is patient at risk for suicide?: No Suicidal Plan?: No Has patient had any suicidal plan within the past 6 months prior to admission? : No Access to Means: No Previous Attempts/Gestures: No Intentional Self Injurious Behavior: None Family Suicide History:  No Recent stressful life event(s): Recent negative physical changes Persecutory voices/beliefs?: No Depression: Yes Depression Symptoms: Feeling angry/irritable, Feeling worthless/self pity, Guilt Substance abuse history and/or treatment for substance abuse?: No Suicide prevention information given to non-admitted patients: Not applicable  Risk to Others within the past 6 months Homicidal Ideation: No Does patient have any lifetime risk of violence toward others beyond the six months prior to admission? : No Thoughts of Harm to Others: No Current Homicidal Intent: No Current Homicidal Plan: No Access to Homicidal Means: No History of harm to others?: No Assessment of Violence: On admission Violent Behavior Description: Pt was aggressive on admission and required a chemical restraint Does patient have access to weapons?: No Criminal Charges Pending?: No Does patient have a court date: No Is patient on probation?: No  Psychosis Hallucinations: None noted Delusions: None noted  Mental Status Report Appearance/Hygiene: Unremarkable, In scrubs Eye Contact: Good Motor Activity: Unremarkable Speech: Logical/coherent, Unremarkable Level of Consciousness: Alert Mood: Pleasant, Euthymic Affect: Appropriate to circumstance Anxiety Level: Minimal Thought Processes: Coherent, Relevant, Tangential Judgement: Partial Orientation: Person, Place, Time, Situation Obsessive Compulsive Thoughts/Behaviors: None  Cognitive Functioning Concentration: Fair Memory: Recent Impaired, Remote Intact Is patient IDD: No Insight: Fair Impulse Control: Poor Appetite: Fair Have you had any weight changes? : No Change Sleep: No Change Vegetative Symptoms: None  ADLScreening [facility] Assessment Services) Patient's cognitive ability adequate to safely complete daily activities?: No Patient able to express need for assistance with ADLs?: No Independently performs ADLs?: Yes (appropriate for  developmental age)  Prior Inpatient Therapy Prior Inpatient Therapy: Yes Prior Therapy Dates: 09/2016 Prior Therapy Facilty/Provider(s): [facility] Reason for Treatment: depression; anxiety  Prior Outpatient Therapy Prior Outpatient Therapy: No Does patient have an ACCT team?: No Does patient have Intensive In-House Services?  : No Does patient have [facility] services? : No Does patient have P4CC services?: No  ADL Screening (condition at time of admission) Patient's cognitive ability adequate to safely complete daily activities?: No Is the patient deaf or have difficulty hearing?: No Does the patient have difficulty seeing, even when wearing glasses/contacts?: No Does the patient have difficulty concentrating, remembering, or making decisions?: Yes Patient able to express need for assistance with ADLs?: No Does the patient have difficulty dressing or bathing?: No Independently performs ADLs?: Yes (appropriate for developmental age) Does the patient have difficulty walking or climbing stairs?: Yes Weakness of Legs: Both Weakness of Arms/Hands: None  Home Assistive Devices/Equipment Home Assistive Devices/Equipment: Cane (specify quad or straight), Eyeglasses(single point cane, left upper extremity sling)      Values / Beliefs Cultural Requests During Hospitalization: None Spiritual Requests During Hospitalization: None   Advance Directives (For Healthcare) Does Patient Have a Medical Advance Directive?: No Would patient like information on creating a medical advance directive?: No - Patient declined          Disposition:  Disposition Initial Assessment Completed for this Encounter: Yes  On Site Evaluation by:   Reviewed with Physician:    Rexene Edison 02/03/2018 10:12 AM

## 2018-02-03 NOTE — BH Assessment (Signed)
[facility] Assessment Progress Note  Per Buford Dresser, DO, this pt requires psychiatric hospitalization at this time.  Pt presents under IVC initiated by EDP Julianne Rice, MD.  At 14:11 Roderic Palau calls from [facility] to report that pt has been accepted to their facility by Dr Dareen Piano to the [facility].  Sheran Fava, NP concurs with this decision.  Pt's nurse has been notified, and agrees to call report to [phone].  Pt is to be transported via [facility].  [place] Coordinator [phone]

## 2018-02-03 NOTE — ED Notes (Signed)
Pt provided with breakfast tray at this time.

## 2018-02-03 NOTE — ED Notes (Signed)
Per pt: May update wife Santiago Glad. Wife updated via telephone.

## 2018-02-03 NOTE — ED Notes (Signed)
Spoke to staff at [facility] about pts qualifications for placement

## 2018-02-03 NOTE — ED Notes (Signed)
[facility] de[facility] notified need for transport. Pt is cooperative with plan.

## 2018-02-03 NOTE — ED Notes (Signed)
Bed: WA29 Expected date:  Expected time:  Means of arrival:  Comments: Hold for room 20

## 2018-02-06 DIAGNOSIS — F410 Panic disorder [episodic paroxysmal anxiety] without agoraphobia: Secondary | ICD-10-CM | POA: Diagnosis not present

## 2018-02-06 DIAGNOSIS — F331 Major depressive disorder, recurrent, moderate: Secondary | ICD-10-CM | POA: Diagnosis not present

## 2018-02-06 DIAGNOSIS — R410 Disorientation, unspecified: Secondary | ICD-10-CM | POA: Diagnosis not present

## 2018-02-09 ENCOUNTER — Other Ambulatory Visit: Payer: Self-pay | Admitting: *Deleted

## 2018-02-09 ENCOUNTER — Ambulatory Visit (INDEPENDENT_AMBULATORY_CARE_PROVIDER_SITE_OTHER): Payer: Medicare Other | Admitting: Neurology

## 2018-02-09 ENCOUNTER — Encounter: Payer: Self-pay | Admitting: Neurology

## 2018-02-09 VITALS — BP 121/78 | HR 115 | Ht 74.0 in | Wt 248.5 lb

## 2018-02-09 DIAGNOSIS — F0390 Unspecified dementia without behavioral disturbance: Secondary | ICD-10-CM | POA: Diagnosis not present

## 2018-02-09 DIAGNOSIS — E538 Deficiency of other specified B group vitamins: Secondary | ICD-10-CM | POA: Diagnosis not present

## 2018-02-09 MED ORDER — CYANOCOBALAMIN 1000 MCG/ML IJ SOLN: 1000.0000 ug | Freq: Once | INTRAMUSCULAR | 11 refills | Status: AC

## 2018-02-09 MED ORDER — "SYRINGE/NEEDLE (DISP) 23G X 1"" 3 ML MISC": 0 refills | Status: DC

## 2018-02-09 NOTE — Progress Notes (Signed)
PATIENT: Michelle Vanhise DOB: 09-Jun-1945  Chief Complaint  Patient presents with  . Referral    Referral from ED and PCP for altered mental status,  onset of seizure room the wife stated they were sent her for neuro testing to rule out dementia  in back hallway pt with  wife Santiago Glad, pt sees pyschiatrist for severe pain attacks and depression     HISTORICAL  Kevan Prouty is a 72 year old male, accompanied by his wife seen in request for hospital discharge on January 27, 2018.  I have reviewed and summarized the referring note from the referring physician.  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  He suffered major family stress in 2017 lost his father from lung cancer, mother was diagnosed with lung cancer in 2018, he had recurrent depression again, getting worse in November 2019 after he visited his mother, developed hallucination, aggressive behavior, required hospital admission from November 19-26,   He was seen by neurologist during his hospital stay, was diagnosed with toxic metabolic encephalopathy, multifactorial, including vitamin B12 deficiency, possible underlying cognitive impairment with acute behavioral disturbance,   Following hospital discharge Depakote DR 500mg  bid was reduced to 250mg  bid, , seroquel 100mg  qhs,   I personally reviewed MRI of  the brain without contrast January 22, 2018: no acute abnormality, generalized atrophy, periventricular small vessel disease.  Lab evaluation in Dec 2019: UDS positive for benzodiazepine, negative troponin, ETOH, B12 77, TSh,  CMP, glucose 143,  CBC Hg 14.7  He was given B12 intramuscular shots by his primary care office, but not consistently.  REVIEW OF SYSTEMS: Full 14 system review of systems performed and notable only for activity change, appetite change, fatigue, blurred vision, diarrhea, incontinence of bladder, joint pain, achy muscles, walking difficulty, memory loss, dizziness, speech difficulty, weakness, tremor, agitation, behavioral problem confusion, depression anxiety hallucinations.  All other review of systems were negative.  ALLERGIES: Allergies  Allergen Reactions  . Sulfa Antibiotics Other (See Comments)    As a child, he ran into walls    HOME MEDICATIONS: Current Outpatient Medications  Medication Sig Dispense Refill  . acetaminophen (TYLENOL) 500 MG tablet Take 1,000 mg by mouth every 6 (six) hours as needed for moderate pain.    Marland Kitchen amantadine (SYMMETREL) 100 MG capsule     . amLODipine (NORVASC) 5 MG tablet Take 5 mg by mouth daily.    . divalproex (DEPAKOTE) 250 MG DR tablet     . QUEtiapine (SEROQUEL) 100 MG tablet     . sertraline (ZOLOFT) 100 MG tablet Take 200 mg by mouth daily.     . vitamin B-12 (CYANOCOBALAMIN) 1000 MCG tablet Take 1 tablet (1,000 mcg total) by mouth daily. 30 tablet 3   No current facility-administered medications for this visit.     PAST MEDICAL HISTORY: Past Medical History:  Diagnosis Date  . Anxiety   .  Bipolar 2 disorder ([place])   . Prostate cancer ([place])     PAST SURGICAL HISTORY: Past Surgical History:  Procedure Laterality Date  . PROSTATE BIOPSY    . RADIOLOGY WITH ANESTHESIA N/A 01/22/2018   Procedure: MRI of the brain WITH ANESTHESIA;  Surgeon: Radiologist, Medication, MD;  Location: [place];  Service: Radiology;   Laterality: N/A;  . REPLACEMENT TOTAL KNEE     bilateral    FAMILY HISTORY: Family History  Problem Relation Age of Onset  . Prostate cancer Father   . Prostate cancer Brother   . Prostate cancer Maternal Grandfather   . Breast cancer Neg Hx   . Pancreatic cancer Neg Hx   . Colon cancer Neg Hx     SOCIAL HISTORY: Social History   Socioeconomic History  . Marital status: Married    Spouse name: Not on file  . Number of children: 1  . Years of education: Not on file  . Highest education level: Not on file  Occupational History  . Not on file  Social Needs  . Financial resource strain: Not on file  . Food insecurity:    Worry: Not on file    Inability: Not on file  . Transportation needs:    Medical: Not on file    Non-medical: Not on file  Tobacco Use  . Smoking status: Never Smoker  . Smokeless tobacco: Never Used  Substance and Sexual Activity  . Alcohol use: Never    Frequency: Never  . Drug use: Never  . Sexual activity: Not Currently  Lifestyle  . Physical activity:    Days per week: Not on file    Minutes per session: Not on file  . Stress: Not on file  Relationships  . Social connections:    Talks on phone: Not on file    Gets together: Not on file    Attends religious service: Not on file    Active member of club or organization: Not on file    Attends meetings of clubs or organizations: Not on file    Relationship status: Not on file  . Intimate partner violence:    Fear of current or ex partner: Not on file    Emotionally abused: Not on file    Physically abused: Not on file    Forced sexual activity: Not on file  Other Topics Concern  . Not on file  Social History Narrative   12-17-17 Unable to ask abuse questions wife with with him today.     PHYSICAL EXAM   Vitals:   02/09/18 1351  BP: 121/78  Pulse: (!) 115  Weight: 248 lb 8 oz (112.7 kg)  Height: 6\' 2"  (1.88 m)    Not recorded      Body mass index is 31.91  kg/m.  PHYSICAL EXAMNIATION:  Gen: NAD, conversant, well nourised, obese, well groomed                     Cardiovascular: Regular rate rhythm, no peripheral edema, warm, nontender. Eyes: Conjunctivae clear without exudates or hemorrhage Neck: Supple, no carotid bruits. Pulmonary: Clear to auscultation bilaterally   NEUROLOGICAL EXAM:  MENTAL STATUS: Montreal Cognitive Assessment  02/10/2018  Visuospatial/ Executive (0/5) 0  Naming (0/3) 3  Attention: Read list of digits (0/2) 2  Attention: Read list of letters (0/1) 0  Attention: Serial 7 subtraction starting at 100 (0/3) 0  Language: Repeat phrase (0/2) 2  Language : Fluency (0/1) 1  Abstraction (0/2)  1  Delayed Recall (0/5) 0  Orientation (0/6) 2  Total 11   Anxious looking elderly male   CRANIAL NERVES: CN II: Visual fields are full to confrontation.  Pupils are round equal and briskly reactive to light. CN III, IV, VI: extraocular movement are normal. No ptosis. CN V: Facial sensation is intact to pinprick in all 3 divisions bilaterally. Corneal responses are intact.  CN VII: Face is symmetric with normal eye closure and smile. CN VIII: Hearing is normal to rubbing fingers CN IX, X: Palate elevates symmetrically. Phonation is normal. CN XI: Head turning and shoulder shrug are intact CN XII: Tongue is midline with normal movements and no atrophy.  MOTOR: There is no pronator drift of out-stretched arms. Muscle bulk and tone are normal. Muscle strength is normal.  REFLEXES: Reflexes are hypoactive and symmetric at the biceps, triceps, knees, and ankles. Plantar responses are flexor.  SENSORY: Intact to light touch, pinprick   COORDINATION: Rapid alternating movements and fine finger movements are intact. There is no dysmetria on finger-to-nose and heel-knee-shin.    GAIT/STANCE: He needs assistance to get up from seated position, lean forward, cautious, unsteady gait. Romberg is absent.   DIAGNOSTIC DATA  (LABS, IMAGING, TESTING) - I reviewed patient records, labs, notes, testing and imaging myself where available.   ASSESSMENT AND PLAN  Sekou Zuckerman is a 72 y.o. male    Cognitive impairment  Likely a component of central nervous system degenerative disorder with superimposed mood disorder  Proceed with home health care  Optimize the management of depression  Vit B12 deficiency  Repeat B12 level  B12 im supplement     Marcial Pacas, M.D. Ph.D.  [facility] [address] [place], [place] [address] Ph: [phone] Fax: [phone]  CC: Referring Provider

## 2018-02-10 ENCOUNTER — Encounter: Payer: Self-pay | Admitting: Neurology

## 2018-02-12 ENCOUNTER — Telehealth: Payer: Self-pay | Admitting: *Deleted

## 2018-02-12 DIAGNOSIS — F0390 Unspecified dementia without behavioral disturbance: Secondary | ICD-10-CM

## 2018-02-12 DIAGNOSIS — R269 Unspecified abnormalities of gait and mobility: Secondary | ICD-10-CM

## 2018-02-12 LAB — VITAMIN B12: VITAMIN B 12: 1872 pg/mL — AB (ref 232–1245)

## 2018-02-12 LAB — FOLATE: Folate: 2.5 ng/mL — ABNORMAL LOW (ref >3.0)

## 2018-02-12 LAB — METHYLMALONIC ACID, SERUM: Methylmalonic Acid: 97 nmol/L (ref 0–378)

## 2018-02-12 NOTE — Telephone Encounter (Signed)
Called patient's wife who stated the patient is unable to speak on the phone. This RN informed her his labs showed a  mild decreased level of folic acid. Advised her Dr Krista Blue recommends he take folic acid 1 mg daily, and continue vitamin  B12 supplement.  She verbalized understanding, appreciation of call.

## 2018-02-16 MED ORDER — FOLIC ACID 1 MG PO TABS: 1.0000 mg | ORAL_TABLET | Freq: Every day | ORAL | 4 refills | Status: DC

## 2018-02-16 NOTE — Telephone Encounter (Signed)
Spoke to patient's wife and she is aware of the orders placed below.  Additionally, she will pick up the prescription for the folic acid from the pharmacy.

## 2018-02-16 NOTE — Addendum Note (Signed)
Addended by: Marcial Pacas on: 02/16/2018 12:20 PM   Modules accepted: Orders

## 2018-02-16 NOTE — Telephone Encounter (Signed)
Pts wife called stating folic acid 1mg  was never called in, stating this will needed to go to [facility]. Also stating Dr. Krista Blue had mentioned a referral for home health and for more Neuro testing but hasn't heard anything regarding the appts.

## 2018-02-16 NOTE — Telephone Encounter (Signed)
Order was placed for home health PT, social work, neuropsychology testing folic acid 1mg  daily Rx

## 2018-02-17 ENCOUNTER — Telehealth: Payer: Self-pay | Admitting: Neurology

## 2018-02-17 DIAGNOSIS — Z8546 Personal history of malignant neoplasm of prostate: Secondary | ICD-10-CM | POA: Diagnosis not present

## 2018-02-17 DIAGNOSIS — Z9181 History of falling: Secondary | ICD-10-CM | POA: Diagnosis not present

## 2018-02-17 DIAGNOSIS — R569 Unspecified convulsions: Secondary | ICD-10-CM | POA: Diagnosis not present

## 2018-02-17 DIAGNOSIS — G9341 Metabolic encephalopathy: Secondary | ICD-10-CM | POA: Diagnosis not present

## 2018-02-17 DIAGNOSIS — F319 Bipolar disorder, unspecified: Secondary | ICD-10-CM | POA: Diagnosis not present

## 2018-02-17 DIAGNOSIS — F410 Panic disorder [episodic paroxysmal anxiety] without agoraphobia: Secondary | ICD-10-CM | POA: Diagnosis not present

## 2018-02-17 DIAGNOSIS — F0280 Dementia in other diseases classified elsewhere without behavioral disturbance: Secondary | ICD-10-CM | POA: Diagnosis not present

## 2018-02-17 DIAGNOSIS — I10 Essential (primary) hypertension: Secondary | ICD-10-CM | POA: Diagnosis not present

## 2018-02-17 DIAGNOSIS — F4310 Post-traumatic stress disorder, unspecified: Secondary | ICD-10-CM | POA: Diagnosis not present

## 2018-02-17 DIAGNOSIS — Z96653 Presence of artificial knee joint, bilateral: Secondary | ICD-10-CM | POA: Diagnosis not present

## 2018-02-17 DIAGNOSIS — E538 Deficiency of other specified B group vitamins: Secondary | ICD-10-CM | POA: Diagnosis not present

## 2018-02-17 DIAGNOSIS — Z87440 Personal history of urinary (tract) infections: Secondary | ICD-10-CM | POA: Diagnosis not present

## 2018-02-17 NOTE — Telephone Encounter (Signed)
PT [facility] has called to inform that he just did evaluation of pt and is requesting 9 weeks for physical therapy and a RN evaluation for nutrition.

## 2018-02-17 NOTE — Telephone Encounter (Signed)
Referral has been sent to [facility] with [facility] . Thanks Hinton Dyer

## 2018-02-17 NOTE — Telephone Encounter (Signed)
Verneita Griffes . Can you please His Home Health referral in like you are putting a referral in . Thanks Hinton Dyer

## 2018-02-17 NOTE — Telephone Encounter (Signed)
Per vo by Dr. Krista Blue, ok to provide requested orders.  Spoke to Byron at [facility] and provided her verbal orders.

## 2018-02-17 NOTE — Addendum Note (Signed)
Addended by: Minna Antis on: 02/17/2018 09:55 AM   Modules accepted: Orders

## 2018-02-17 NOTE — Telephone Encounter (Signed)
Order for PT, SW and face-to-face encounter for Medicare/Medicaid patients entered.

## 2018-02-18 DIAGNOSIS — I10 Essential (primary) hypertension: Secondary | ICD-10-CM | POA: Diagnosis not present

## 2018-02-18 DIAGNOSIS — E538 Deficiency of other specified B group vitamins: Secondary | ICD-10-CM | POA: Diagnosis not present

## 2018-02-18 DIAGNOSIS — F410 Panic disorder [episodic paroxysmal anxiety] without agoraphobia: Secondary | ICD-10-CM | POA: Diagnosis not present

## 2018-02-18 DIAGNOSIS — F4310 Post-traumatic stress disorder, unspecified: Secondary | ICD-10-CM | POA: Diagnosis not present

## 2018-02-18 DIAGNOSIS — F0280 Dementia in other diseases classified elsewhere without behavioral disturbance: Secondary | ICD-10-CM | POA: Diagnosis not present

## 2018-02-18 DIAGNOSIS — F319 Bipolar disorder, unspecified: Secondary | ICD-10-CM | POA: Diagnosis not present

## 2018-02-20 ENCOUNTER — Encounter: Payer: Self-pay | Admitting: Psychology

## 2018-02-20 DIAGNOSIS — F0280 Dementia in other diseases classified elsewhere without behavioral disturbance: Secondary | ICD-10-CM | POA: Diagnosis not present

## 2018-02-20 DIAGNOSIS — F4310 Post-traumatic stress disorder, unspecified: Secondary | ICD-10-CM | POA: Diagnosis not present

## 2018-02-20 DIAGNOSIS — I10 Essential (primary) hypertension: Secondary | ICD-10-CM | POA: Diagnosis not present

## 2018-02-20 DIAGNOSIS — F410 Panic disorder [episodic paroxysmal anxiety] without agoraphobia: Secondary | ICD-10-CM | POA: Diagnosis not present

## 2018-02-20 DIAGNOSIS — F319 Bipolar disorder, unspecified: Secondary | ICD-10-CM | POA: Diagnosis not present

## 2018-02-20 DIAGNOSIS — E538 Deficiency of other specified B group vitamins: Secondary | ICD-10-CM | POA: Diagnosis not present

## 2018-02-21 DIAGNOSIS — F0280 Dementia in other diseases classified elsewhere without behavioral disturbance: Secondary | ICD-10-CM | POA: Diagnosis not present

## 2018-02-21 DIAGNOSIS — E538 Deficiency of other specified B group vitamins: Secondary | ICD-10-CM | POA: Diagnosis not present

## 2018-02-21 DIAGNOSIS — F319 Bipolar disorder, unspecified: Secondary | ICD-10-CM | POA: Diagnosis not present

## 2018-02-21 DIAGNOSIS — F4310 Post-traumatic stress disorder, unspecified: Secondary | ICD-10-CM | POA: Diagnosis not present

## 2018-02-21 DIAGNOSIS — I10 Essential (primary) hypertension: Secondary | ICD-10-CM | POA: Diagnosis not present

## 2018-02-21 DIAGNOSIS — F410 Panic disorder [episodic paroxysmal anxiety] without agoraphobia: Secondary | ICD-10-CM | POA: Diagnosis not present

## 2018-02-24 DIAGNOSIS — E538 Deficiency of other specified B group vitamins: Secondary | ICD-10-CM | POA: Diagnosis not present

## 2018-02-24 DIAGNOSIS — F0280 Dementia in other diseases classified elsewhere without behavioral disturbance: Secondary | ICD-10-CM | POA: Diagnosis not present

## 2018-02-24 DIAGNOSIS — F4310 Post-traumatic stress disorder, unspecified: Secondary | ICD-10-CM | POA: Diagnosis not present

## 2018-02-24 DIAGNOSIS — F319 Bipolar disorder, unspecified: Secondary | ICD-10-CM | POA: Diagnosis not present

## 2018-02-24 DIAGNOSIS — F410 Panic disorder [episodic paroxysmal anxiety] without agoraphobia: Secondary | ICD-10-CM | POA: Diagnosis not present

## 2018-02-24 DIAGNOSIS — I10 Essential (primary) hypertension: Secondary | ICD-10-CM | POA: Diagnosis not present

## 2018-03-02 DIAGNOSIS — F4310 Post-traumatic stress disorder, unspecified: Secondary | ICD-10-CM | POA: Diagnosis not present

## 2018-03-02 DIAGNOSIS — I10 Essential (primary) hypertension: Secondary | ICD-10-CM | POA: Diagnosis not present

## 2018-03-02 DIAGNOSIS — F319 Bipolar disorder, unspecified: Secondary | ICD-10-CM | POA: Diagnosis not present

## 2018-03-02 DIAGNOSIS — F410 Panic disorder [episodic paroxysmal anxiety] without agoraphobia: Secondary | ICD-10-CM | POA: Diagnosis not present

## 2018-03-02 DIAGNOSIS — E538 Deficiency of other specified B group vitamins: Secondary | ICD-10-CM | POA: Diagnosis not present

## 2018-03-02 DIAGNOSIS — F0280 Dementia in other diseases classified elsewhere without behavioral disturbance: Secondary | ICD-10-CM | POA: Diagnosis not present

## 2018-03-04 DIAGNOSIS — Z23 Encounter for immunization: Secondary | ICD-10-CM | POA: Diagnosis not present

## 2018-03-06 DIAGNOSIS — F410 Panic disorder [episodic paroxysmal anxiety] without agoraphobia: Secondary | ICD-10-CM | POA: Diagnosis not present

## 2018-03-06 DIAGNOSIS — E538 Deficiency of other specified B group vitamins: Secondary | ICD-10-CM | POA: Diagnosis not present

## 2018-03-06 DIAGNOSIS — F4310 Post-traumatic stress disorder, unspecified: Secondary | ICD-10-CM | POA: Diagnosis not present

## 2018-03-06 DIAGNOSIS — F319 Bipolar disorder, unspecified: Secondary | ICD-10-CM | POA: Diagnosis not present

## 2018-03-06 DIAGNOSIS — I10 Essential (primary) hypertension: Secondary | ICD-10-CM | POA: Diagnosis not present

## 2018-03-06 DIAGNOSIS — F0280 Dementia in other diseases classified elsewhere without behavioral disturbance: Secondary | ICD-10-CM | POA: Diagnosis not present

## 2018-03-09 DIAGNOSIS — F331 Major depressive disorder, recurrent, moderate: Secondary | ICD-10-CM | POA: Diagnosis not present

## 2018-03-09 DIAGNOSIS — F410 Panic disorder [episodic paroxysmal anxiety] without agoraphobia: Secondary | ICD-10-CM | POA: Diagnosis not present

## 2018-03-09 DIAGNOSIS — F0280 Dementia in other diseases classified elsewhere without behavioral disturbance: Secondary | ICD-10-CM | POA: Diagnosis not present

## 2018-03-09 DIAGNOSIS — F332 Major depressive disorder, recurrent severe without psychotic features: Secondary | ICD-10-CM | POA: Diagnosis not present

## 2018-03-09 DIAGNOSIS — F319 Bipolar disorder, unspecified: Secondary | ICD-10-CM | POA: Diagnosis not present

## 2018-03-09 DIAGNOSIS — I10 Essential (primary) hypertension: Secondary | ICD-10-CM | POA: Diagnosis not present

## 2018-03-09 DIAGNOSIS — E538 Deficiency of other specified B group vitamins: Secondary | ICD-10-CM | POA: Diagnosis not present

## 2018-03-09 DIAGNOSIS — F4310 Post-traumatic stress disorder, unspecified: Secondary | ICD-10-CM | POA: Diagnosis not present

## 2018-03-12 DIAGNOSIS — F319 Bipolar disorder, unspecified: Secondary | ICD-10-CM | POA: Diagnosis not present

## 2018-03-12 DIAGNOSIS — F0280 Dementia in other diseases classified elsewhere without behavioral disturbance: Secondary | ICD-10-CM | POA: Diagnosis not present

## 2018-03-12 DIAGNOSIS — I10 Essential (primary) hypertension: Secondary | ICD-10-CM | POA: Diagnosis not present

## 2018-03-12 DIAGNOSIS — E538 Deficiency of other specified B group vitamins: Secondary | ICD-10-CM | POA: Diagnosis not present

## 2018-03-12 DIAGNOSIS — F4310 Post-traumatic stress disorder, unspecified: Secondary | ICD-10-CM | POA: Diagnosis not present

## 2018-03-12 DIAGNOSIS — F410 Panic disorder [episodic paroxysmal anxiety] without agoraphobia: Secondary | ICD-10-CM | POA: Diagnosis not present

## 2018-03-16 DIAGNOSIS — F4310 Post-traumatic stress disorder, unspecified: Secondary | ICD-10-CM | POA: Diagnosis not present

## 2018-03-16 DIAGNOSIS — E538 Deficiency of other specified B group vitamins: Secondary | ICD-10-CM | POA: Diagnosis not present

## 2018-03-16 DIAGNOSIS — F0280 Dementia in other diseases classified elsewhere without behavioral disturbance: Secondary | ICD-10-CM | POA: Diagnosis not present

## 2018-03-16 DIAGNOSIS — F410 Panic disorder [episodic paroxysmal anxiety] without agoraphobia: Secondary | ICD-10-CM | POA: Diagnosis not present

## 2018-03-16 DIAGNOSIS — I10 Essential (primary) hypertension: Secondary | ICD-10-CM | POA: Diagnosis not present

## 2018-03-16 DIAGNOSIS — F319 Bipolar disorder, unspecified: Secondary | ICD-10-CM | POA: Diagnosis not present

## 2018-03-19 DIAGNOSIS — F319 Bipolar disorder, unspecified: Secondary | ICD-10-CM | POA: Diagnosis not present

## 2018-03-19 DIAGNOSIS — I10 Essential (primary) hypertension: Secondary | ICD-10-CM | POA: Diagnosis not present

## 2018-03-19 DIAGNOSIS — E538 Deficiency of other specified B group vitamins: Secondary | ICD-10-CM | POA: Diagnosis not present

## 2018-03-19 DIAGNOSIS — F410 Panic disorder [episodic paroxysmal anxiety] without agoraphobia: Secondary | ICD-10-CM | POA: Diagnosis not present

## 2018-03-19 DIAGNOSIS — F0280 Dementia in other diseases classified elsewhere without behavioral disturbance: Secondary | ICD-10-CM | POA: Diagnosis not present

## 2018-03-19 DIAGNOSIS — F4310 Post-traumatic stress disorder, unspecified: Secondary | ICD-10-CM | POA: Diagnosis not present

## 2018-03-23 DIAGNOSIS — F0280 Dementia in other diseases classified elsewhere without behavioral disturbance: Secondary | ICD-10-CM | POA: Diagnosis not present

## 2018-03-23 DIAGNOSIS — F4310 Post-traumatic stress disorder, unspecified: Secondary | ICD-10-CM | POA: Diagnosis not present

## 2018-03-23 DIAGNOSIS — F319 Bipolar disorder, unspecified: Secondary | ICD-10-CM | POA: Diagnosis not present

## 2018-03-23 DIAGNOSIS — F410 Panic disorder [episodic paroxysmal anxiety] without agoraphobia: Secondary | ICD-10-CM | POA: Diagnosis not present

## 2018-03-23 DIAGNOSIS — I10 Essential (primary) hypertension: Secondary | ICD-10-CM | POA: Diagnosis not present

## 2018-03-23 DIAGNOSIS — E538 Deficiency of other specified B group vitamins: Secondary | ICD-10-CM | POA: Diagnosis not present

## 2018-03-30 DIAGNOSIS — F319 Bipolar disorder, unspecified: Secondary | ICD-10-CM | POA: Diagnosis not present

## 2018-03-30 DIAGNOSIS — I10 Essential (primary) hypertension: Secondary | ICD-10-CM | POA: Diagnosis not present

## 2018-03-30 DIAGNOSIS — F4310 Post-traumatic stress disorder, unspecified: Secondary | ICD-10-CM | POA: Diagnosis not present

## 2018-03-30 DIAGNOSIS — E538 Deficiency of other specified B group vitamins: Secondary | ICD-10-CM | POA: Diagnosis not present

## 2018-03-30 DIAGNOSIS — F410 Panic disorder [episodic paroxysmal anxiety] without agoraphobia: Secondary | ICD-10-CM | POA: Diagnosis not present

## 2018-03-30 DIAGNOSIS — F0280 Dementia in other diseases classified elsewhere without behavioral disturbance: Secondary | ICD-10-CM | POA: Diagnosis not present

## 2018-04-01 DIAGNOSIS — C61 Malignant neoplasm of prostate: Secondary | ICD-10-CM | POA: Diagnosis not present

## 2018-04-02 DIAGNOSIS — E538 Deficiency of other specified B group vitamins: Secondary | ICD-10-CM | POA: Diagnosis not present

## 2018-04-02 DIAGNOSIS — F4310 Post-traumatic stress disorder, unspecified: Secondary | ICD-10-CM | POA: Diagnosis not present

## 2018-04-02 DIAGNOSIS — F319 Bipolar disorder, unspecified: Secondary | ICD-10-CM | POA: Diagnosis not present

## 2018-04-02 DIAGNOSIS — F410 Panic disorder [episodic paroxysmal anxiety] without agoraphobia: Secondary | ICD-10-CM | POA: Diagnosis not present

## 2018-04-02 DIAGNOSIS — F0280 Dementia in other diseases classified elsewhere without behavioral disturbance: Secondary | ICD-10-CM | POA: Diagnosis not present

## 2018-04-02 DIAGNOSIS — I10 Essential (primary) hypertension: Secondary | ICD-10-CM | POA: Diagnosis not present

## 2018-04-06 DIAGNOSIS — E538 Deficiency of other specified B group vitamins: Secondary | ICD-10-CM | POA: Diagnosis not present

## 2018-04-06 DIAGNOSIS — F4310 Post-traumatic stress disorder, unspecified: Secondary | ICD-10-CM | POA: Diagnosis not present

## 2018-04-06 DIAGNOSIS — F319 Bipolar disorder, unspecified: Secondary | ICD-10-CM | POA: Diagnosis not present

## 2018-04-06 DIAGNOSIS — F410 Panic disorder [episodic paroxysmal anxiety] without agoraphobia: Secondary | ICD-10-CM | POA: Diagnosis not present

## 2018-04-06 DIAGNOSIS — F0280 Dementia in other diseases classified elsewhere without behavioral disturbance: Secondary | ICD-10-CM | POA: Diagnosis not present

## 2018-04-06 DIAGNOSIS — I10 Essential (primary) hypertension: Secondary | ICD-10-CM | POA: Diagnosis not present

## 2018-04-08 DIAGNOSIS — R3915 Urgency of urination: Secondary | ICD-10-CM | POA: Diagnosis not present

## 2018-04-08 DIAGNOSIS — C61 Malignant neoplasm of prostate: Secondary | ICD-10-CM | POA: Diagnosis not present

## 2018-04-08 DIAGNOSIS — N401 Enlarged prostate with lower urinary tract symptoms: Secondary | ICD-10-CM | POA: Diagnosis not present

## 2018-04-09 DIAGNOSIS — F410 Panic disorder [episodic paroxysmal anxiety] without agoraphobia: Secondary | ICD-10-CM | POA: Diagnosis not present

## 2018-04-09 DIAGNOSIS — E538 Deficiency of other specified B group vitamins: Secondary | ICD-10-CM | POA: Diagnosis not present

## 2018-04-09 DIAGNOSIS — I10 Essential (primary) hypertension: Secondary | ICD-10-CM | POA: Diagnosis not present

## 2018-04-09 DIAGNOSIS — F4310 Post-traumatic stress disorder, unspecified: Secondary | ICD-10-CM | POA: Diagnosis not present

## 2018-04-09 DIAGNOSIS — F0280 Dementia in other diseases classified elsewhere without behavioral disturbance: Secondary | ICD-10-CM | POA: Diagnosis not present

## 2018-04-09 DIAGNOSIS — F319 Bipolar disorder, unspecified: Secondary | ICD-10-CM | POA: Diagnosis not present

## 2018-04-14 DIAGNOSIS — E538 Deficiency of other specified B group vitamins: Secondary | ICD-10-CM | POA: Diagnosis not present

## 2018-04-14 DIAGNOSIS — F410 Panic disorder [episodic paroxysmal anxiety] without agoraphobia: Secondary | ICD-10-CM | POA: Diagnosis not present

## 2018-04-14 DIAGNOSIS — F319 Bipolar disorder, unspecified: Secondary | ICD-10-CM | POA: Diagnosis not present

## 2018-04-14 DIAGNOSIS — F0280 Dementia in other diseases classified elsewhere without behavioral disturbance: Secondary | ICD-10-CM | POA: Diagnosis not present

## 2018-04-14 DIAGNOSIS — F4310 Post-traumatic stress disorder, unspecified: Secondary | ICD-10-CM | POA: Diagnosis not present

## 2018-04-14 DIAGNOSIS — I10 Essential (primary) hypertension: Secondary | ICD-10-CM | POA: Diagnosis not present

## 2018-04-16 DIAGNOSIS — F4310 Post-traumatic stress disorder, unspecified: Secondary | ICD-10-CM | POA: Diagnosis not present

## 2018-04-16 DIAGNOSIS — E538 Deficiency of other specified B group vitamins: Secondary | ICD-10-CM | POA: Diagnosis not present

## 2018-04-16 DIAGNOSIS — F319 Bipolar disorder, unspecified: Secondary | ICD-10-CM | POA: Diagnosis not present

## 2018-04-16 DIAGNOSIS — F0280 Dementia in other diseases classified elsewhere without behavioral disturbance: Secondary | ICD-10-CM | POA: Diagnosis not present

## 2018-04-16 DIAGNOSIS — I10 Essential (primary) hypertension: Secondary | ICD-10-CM | POA: Diagnosis not present

## 2018-04-16 DIAGNOSIS — F410 Panic disorder [episodic paroxysmal anxiety] without agoraphobia: Secondary | ICD-10-CM | POA: Diagnosis not present

## 2018-04-20 DIAGNOSIS — Z23 Encounter for immunization: Secondary | ICD-10-CM | POA: Diagnosis not present

## 2018-04-20 DIAGNOSIS — D485 Neoplasm of uncertain behavior of skin: Secondary | ICD-10-CM | POA: Diagnosis not present

## 2018-04-20 DIAGNOSIS — L821 Other seborrheic keratosis: Secondary | ICD-10-CM | POA: Diagnosis not present

## 2018-04-20 DIAGNOSIS — C44319 Basal cell carcinoma of skin of other parts of face: Secondary | ICD-10-CM | POA: Diagnosis not present

## 2018-05-07 ENCOUNTER — Encounter: Payer: Medicare Other | Attending: Psychology | Admitting: Psychology

## 2018-05-07 DIAGNOSIS — F333 Major depressive disorder, recurrent, severe with psychotic symptoms: Secondary | ICD-10-CM | POA: Diagnosis not present

## 2018-05-07 DIAGNOSIS — F0390 Unspecified dementia without behavioral disturbance: Secondary | ICD-10-CM | POA: Insufficient documentation

## 2018-05-07 DIAGNOSIS — R251 Tremor, unspecified: Secondary | ICD-10-CM | POA: Insufficient documentation

## 2018-05-07 DIAGNOSIS — G969 Disorder of central nervous system, unspecified: Secondary | ICD-10-CM | POA: Diagnosis not present

## 2018-05-08 DIAGNOSIS — I10 Essential (primary) hypertension: Secondary | ICD-10-CM | POA: Diagnosis not present

## 2018-05-08 DIAGNOSIS — R42 Dizziness and giddiness: Secondary | ICD-10-CM | POA: Diagnosis not present

## 2018-05-08 DIAGNOSIS — E538 Deficiency of other specified B group vitamins: Secondary | ICD-10-CM | POA: Diagnosis not present

## 2018-05-08 DIAGNOSIS — R7303 Prediabetes: Secondary | ICD-10-CM | POA: Diagnosis not present

## 2018-05-08 DIAGNOSIS — F339 Major depressive disorder, recurrent, unspecified: Secondary | ICD-10-CM | POA: Diagnosis not present

## 2018-05-11 DIAGNOSIS — C44319 Basal cell carcinoma of skin of other parts of face: Secondary | ICD-10-CM | POA: Diagnosis not present

## 2018-05-22 ENCOUNTER — Other Ambulatory Visit: Payer: Self-pay

## 2018-05-22 ENCOUNTER — Encounter: Payer: Self-pay | Admitting: Psychology

## 2018-05-22 ENCOUNTER — Encounter: Payer: Medicare Other | Admitting: Psychology

## 2018-05-22 DIAGNOSIS — G969 Disorder of central nervous system, unspecified: Secondary | ICD-10-CM | POA: Diagnosis not present

## 2018-05-22 DIAGNOSIS — F333 Major depressive disorder, recurrent, severe with psychotic symptoms: Secondary | ICD-10-CM | POA: Diagnosis not present

## 2018-05-22 DIAGNOSIS — F0390 Unspecified dementia without behavioral disturbance: Secondary | ICD-10-CM | POA: Diagnosis not present

## 2018-05-22 DIAGNOSIS — R251 Tremor, unspecified: Secondary | ICD-10-CM | POA: Diagnosis not present

## 2018-05-22 NOTE — Progress Notes (Signed)
Mental Status Examination & Behavior Observations: The patient arrived on time to his 13:00 testing appointment and was accompanied by his wife. He was administered the Wechsler Adult Intelligence Scale, 4th Edition, and Wechsler Memory Scale, 4th Edition (Older Adult Battery). The evaluation lasted 150 minutes. He was alert and fully oriented. He was casually dressed and appeared to have good hygiene. He ambulated without difficulty. Bilateral hand tremor was observed throughout testing that worsened on motor tasks Garment/textile technologist). His spontaneous speech was clear and prosodic with normal rate, tone, and volume. Mood was mildly anxious with appropriate and congruent affect. Thought processes were grossly organized, logical, with good abstract reasoning. Insight and Judgement are good.   He was cooperative with all assigned tasks and appeared to give good effort. He did not have any difficulty hearing or understanding test instructions and did not require much (if any) additional prompting throughout the evaluation. He exhibited good frustration tolerance on questions he did not know or tasks that were more difficult.   Results of the WAIS-IV:   Composite Score Summary  Scale Sum of Scaled Scores Composite Score Percentile Rank 95% Conf. Interval Qualitative Description  Verbal Comprehension 46 VCI 132 98 125-136 Very Superior  Perceptual Reasoning 33 PRI 105 63 99-111 Average  Working Memory 30 WMI 128 97 120-133 Superior  Processing Speed 23 PSI 108 70 99-116 Average  Full Scale 132 FSIQ 122 93 117-126 Superior  General Ability 79 GAI 121 92 115-125 Superior    Index Level Discrepancy Comparisons  Comparison Score 1 Score 2 Difference Critical Value .05 Significant Difference Y/N Base Rate by Overall Sample  VCI - PRI 132 105 27 8.31 Y 2.6  VCI - WMI 132 128 4 9.30 N 38.5  VCI - PSI 132 108 24 10.19 Y 7.0  PRI - WMI 105 128 -23 10.18 Y 4.5  PRI - PSI 105 108 -3 11.00 N 43.9  WMI - PSI  128 108 20 11.76 Y 9.7  FSIQ - GAI 122 121 1 3.61 N 46.3   Differences Between Subtest and Overall Mean of Subtest Scores  Subtest Subtest Scaled Score Mean Scaled Score Difference Critical Value .05 Strength or Weakness Base Rate  Digit Span 16 13.20 2.80 2.22 S 15-25%  Visual Puzzles 10 13.20 -3.20 2.71 W 10-15%  Information 16 13.20 2.80 2.19 S 15-25%  Coding 9 13.20 -4.20 2.97 W 5-10%    Results of the WMS-IV (Older Adult):   Brief Cognitive Status Exam Classification  Age Years of Education Raw Score Classification Level Base Rate  72 years 5 months 16 57 Average 100.0    Index Score Summary  Index Sum of Scaled Scores Index Score Percentile Rank 95% Confidence Interval Qualitative Descriptor  Auditory Memory (AMI) 43 104 61 98-110 Average  Visual Memory (VMI) 18 95 37 90-100 Average  Immediate Memory (IMI) 32 104 61 98-110 Average  Delayed Memory (DMI) 29 98 45 91-106 Average   Primary Subtest Scaled Score Summary  Subtest Domain Raw Score Scaled Score Percentile Rank  Logical Memory I AM 33 11 63  Logical Memory II AM 16 9 37  Verbal Paired Associates I AM 25 12 75  Verbal Paired Associates II AM 7 11 63  Visual Reproduction I VM 27 9 37  Visual Reproduction II VM 15 9 37  Symbol Span VWM 23 13 84   Auditory Memory Process Score Summary  Process Score Raw Score Scaled Score Percentile Rank Cumulative Percentage (Base Rate)  LM II  Recognition 20 - - >75%  VPA II Recognition 30 - - >75%   Visual Memory Process Score Summary  Process Score Raw Score Scaled Score Percentile Rank Cumulative Percentage (Base Rate)  VR II Recognition 7 - - >75%

## 2018-06-04 ENCOUNTER — Encounter: Payer: Self-pay | Admitting: Psychology

## 2018-06-04 DIAGNOSIS — F331 Major depressive disorder, recurrent, moderate: Secondary | ICD-10-CM | POA: Diagnosis not present

## 2018-06-04 DIAGNOSIS — F0390 Unspecified dementia without behavioral disturbance: Secondary | ICD-10-CM | POA: Diagnosis not present

## 2018-06-04 DIAGNOSIS — F332 Major depressive disorder, recurrent severe without psychotic features: Secondary | ICD-10-CM | POA: Diagnosis not present

## 2018-06-04 DIAGNOSIS — F410 Panic disorder [episodic paroxysmal anxiety] without agoraphobia: Secondary | ICD-10-CM | POA: Diagnosis not present
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
## 2018-06-18 ENCOUNTER — Encounter: Payer: Self-pay | Admitting: Psychology

## 2018-06-18 ENCOUNTER — Other Ambulatory Visit: Payer: Self-pay

## 2018-06-18 ENCOUNTER — Encounter: Payer: Medicare Other | Attending: Psychology | Admitting: Psychology

## 2018-06-18 DIAGNOSIS — F0390 Unspecified dementia without behavioral disturbance: Secondary | ICD-10-CM | POA: Diagnosis not present

## 2018-06-18 DIAGNOSIS — G969 Disorder of central nervous system, unspecified: Secondary | ICD-10-CM | POA: Diagnosis not present

## 2018-06-18 DIAGNOSIS — F333 Major depressive disorder, recurrent, severe with psychotic symptoms: Secondary | ICD-10-CM | POA: Diagnosis not present

## 2018-06-18 DIAGNOSIS — F09 Unspecified mental disorder due to known physiological condition: Secondary | ICD-10-CM | POA: Diagnosis not present

## 2018-06-18 DIAGNOSIS — R251 Tremor, unspecified: Secondary | ICD-10-CM | POA: Insufficient documentation

## 2018-06-18 NOTE — Progress Notes (Signed)
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 Raw Score Scaled Score Percentile Rank Cumulative Percentage (Base Rate)  LM II  Recognition 20 - - >75%  VPA II Recognition 30 - - >75%   Visual Memory Process Score Summary  Process Score Raw Score Scaled Score Percentile Rank Cumulative Percentage (Base Rate)  VR II Recognition 7 - - >75%    SUBTEST-LEVEL DIFFERENCES WITHIN INDEXES  Auditory Memory Index  Subtest Scaled Score AMI Mean Score Difference from Mean Critical Value Base Rate  Logical Memory I 11 10.75 0.25 2.45 >25%  Logical Memory II 9 10.75 -1.75 2.38 25%  [facility] I 12 10.75 1.25 2.04 >25%  [facility] II 11 10.75 0.25 3.06 >25%  Statistical significance (critical value) at the .05 level.   Immediate Memory Index  Subtest Scaled Score IMI Mean Score Difference from Mean Critical Value Base Rate  Logical Memory I 11 10.67 0.33 2.01 >25%  [facility] I 12 10.67 1.33 1.71 >25%  Visual  Reproduction I 9 10.67 -1.67 1.71 >25%  Statistical significance (critical value) at the .05 level.   Delayed Memory Index  Subtest Scaled Score DMI Mean Score Difference from Mean Critical Value Base Rate  Logical Memory II 9 9.67 -0.67 2.15 >25%  [facility] II 11 9.67 1.33 2.63 >25%  Visual Reproduction II 9 9.67 -0.67 1.77 >25%  Statistical significance (critical value) at the .05 level.    SUBTEST DISCREPANCY COMPARISON  Comparison Score 1 Score 2 Difference Critical Value Base Rate  Visual Reproduction I - Visual Reproduction II 9 9 0 1.99 -  Statistical significance (critical value) at the .05 level.    SUBTEST-LEVEL CONTRAST SCALED SCORES  Logical Memory  Score Score 1 Score 2 Contrast Scaled Score  LM II Recognition vs. Delayed Recall >75% 9 7  LM Immediate Recall vs. Delayed Recall 11 9 7    [facility]  Score Score 1 Score 2 Contrast Scaled Score  VPA II Recognition vs. Delayed Recall >75% 11 9  VPA Immediate Recall vs. Delayed Recall 12 11 8    Visual Reproduction  Score Score 1 Score 2 Contrast Scaled Score  VR II Recognition vs. Delayed Recall >75% 9 7  VR Immediate Recall vs. Delayed Recall 9 9 9     INDEX-LEVEL CONTRAST SCALED SCORES  WMS-IV Indexes  Score Score 1 Score 2 Contrast Scaled Score  Auditory Memory Index vs. Visual Memory Index 104 95 8  Immediate Memory Index vs. Delayed Memory Index 104 98 9    RAW SCORES  Subtest Score Range Adult Score Range Older Adult Raw Score  Brief Cognitive Status Exam 0-58 0-58 57  Logical Memory I 0-50 0-53 33  Logical Memory II 0-50 0-39 16  [facility] I 0-56 0-40 25  [facility] II 0-14 0-10 7  CVLT-II Trials 1-5 5-95 5-95   CVLT-II Long-Delay -5-5 -5-5   Designs I 0-120    Designs II 0-120    Visual Reproduction I 0-43 0-43 27  Visual Reproduction II 0-43 0-43 15  Spatial Addition 0-24    Symbol Span 0-50 0-50 23  Process Score Range Adult Score  Range Older Adult Raw Score  LM II Recognition 0-30 0-23 20  VPA II Recognition 0-40 0-30 30  VPA II Word Recall 0-28 0-20   DE I Content 0-48    DE I Spatial 0-24    DE II Content 0-48    DE II Spatial 0-24    DE II Recognition 0-24    VR II Recognition 0-7 0-7 7  VR II Copy 0-43 0-43  ABILITY-MEMORY ANALYSIS  Ability Score:  GAI: 121 Date of Testing:  WAIS-IV 2018/05/22; WMS-IV 2018/05/22  Predicted Difference Method   Index Predicted WMS-IV Index Score Actual WMS-IV Index Score Difference Critical Value  Significant Difference Y/N Base Rate  Auditory Memory 111 104 7 10.41 N   Visual Memory 112 95 17 7.89 Y 5-10%  Immediate Memory 114 104 10 9.97 Y 20%  Delayed Memory 112 98 14 12.33 Y 15%  Statistical significance (critical value) at the .01 level.   Contrast Scaled Scores  Score Score 1 Score 2 Contrast Scaled Score  General Ability Index vs. Auditory Memory Index 121 104 9  General Ability Index vs. Visual Memory Index 121 95 6  General Ability Index vs. Immediate Memory Index 121 104 8  General Ability Index vs. Delayed Memory Index 121 98 7  Verbal Comprehension Index vs. Auditory Memory Index 132 104 8  Perceptual Reasoning Index vs. Visual Memory Index 105 95 8  Working Memory Index vs. Auditory Memory Index 128 104 9    End of Report

## 2018-07-21 ENCOUNTER — Other Ambulatory Visit: Payer: Self-pay

## 2018-07-21 ENCOUNTER — Encounter: Payer: Medicare Other | Attending: Psychology | Admitting: Psychology

## 2018-07-21 ENCOUNTER — Encounter: Payer: Self-pay | Admitting: Psychology

## 2018-07-21 DIAGNOSIS — F0390 Unspecified dementia without behavioral disturbance: Secondary | ICD-10-CM | POA: Diagnosis not present

## 2018-07-21 DIAGNOSIS — G969 Disorder of central nervous system, unspecified: Secondary | ICD-10-CM | POA: Insufficient documentation

## 2018-07-21 DIAGNOSIS — F09 Unspecified mental disorder due to known physiological condition: Secondary | ICD-10-CM | POA: Diagnosis not present

## 2018-07-21 DIAGNOSIS — R251 Tremor, unspecified: Secondary | ICD-10-CM | POA: Insufficient documentation

## 2018-07-21 DIAGNOSIS — F333 Major depressive disorder, recurrent, severe with psychotic symptoms: Secondary | ICD-10-CM | POA: Diagnosis not present

## 2018-07-21 NOTE — Progress Notes (Signed)
Today I provided feedback regarding the results of the recent neuropsychological evaluation.  The patient and his wife are present for this feedback.  This was an in person visit.  It lasted for 1 hour and 15 minutes.  The patient reports that since I saw him in person last that he has been doing better and that his depressive symptoms have been much improved and he is responding quite well to the interventions as far as treatment for his depression are concerned.  The patient clearly reports that his cognitive function improves in correlation with his depressive disorder and PTSD symptoms.  Below you will find the summary and interpretations of the recent neuropsychological evaluation that was the focus of our visit today.  The full report can be found on the visit dated 05/07/2018.   Impression/Diagnosis:                           Overall, the results of this broad range of cognitive functioning do not suggest any significant or specific diagnostic patterns relative to patterns typically seen with degenerative cortical dementias or subcortical dementias.  The patient had no performances that were impaired relative to his normative comparison groups and all of his performances were in the average to superior range of functioning.  The patient did have some weaknesses with regard to auditory and visual memory functions both immediate and delayed relative to his own excellent overall cognitive functioning but it is hard to define these as specific degenerative or loss of function areas.  It will be important going forward to do repeat testing in approximately 9 to 12 months to see if there is any further disturbance or decline.  It is clear that the patient has significant psychiatric illness and has been dealing with significant psychiatric illness for his adult life.  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  This is left him with residual  chronic posttraumatic stress disorder.  The patient also has severe psychiatric illness with regard to major depressive events that have led to complete loss of functioning as well as auditory hallucinations and dissociative experiences during these events.  It is possible that some of these are related to a mood disorder in general but the patient denied any events where he developed manic episodes with psychosis and all of his psychotic and dissociative episodes occurred during major depressive events.  The patient acknowledges that when his psychiatric illnesses not in an exacerbated state that his overall cognitive functioning is significantly improved.  He was not in a significant psychiatric illness state during this testing.  The patient also has evidence of tremor which are likely exacerbate some of his anxiety symptoms.  The finding on his MRI of cortical atrophy and small vessel disease would likely explain his relative weaknesses with regard to some measures of information processing speed and the reduction in his relative memory function to his otherwise superior cognitive and intellectual abilities.  Again, it will be important for [place] to follow-up with repeat neuropsychological testing in approximately 1 year.  If there is any acute exacerbation is likely related to more psychiatric illness and a acute phase of his major depressive disorder with psychotic features.  It is possible that we are assessing this at an early stage of cortical mediated dementia but at the present time there are no areas of cognitive function that were found to be impaired and outside of at least  the average range of functioning relative to his normative comparison group.  Diagnosis:                         Axis I: Severe episode of recurrent major depressive disorder, with psychotic features ([place])  Mild cognitive disorder  Tremor due to disorder of central nervous system   Ilean Skill,  Psy.D. Neuropsychologist

## 2018-08-06 ENCOUNTER — Ambulatory Visit: Payer: Self-pay | Admitting: Psychology

## 2018-08-06 DIAGNOSIS — C61 Malignant neoplasm of prostate: Secondary | ICD-10-CM | POA: Diagnosis not present

## 2018-08-12 DIAGNOSIS — N401 Enlarged prostate with lower urinary tract symptoms: Secondary | ICD-10-CM | POA: Diagnosis not present

## 2018-08-12 DIAGNOSIS — C61 Malignant neoplasm of prostate: Secondary | ICD-10-CM | POA: Diagnosis not present

## 2018-08-12 DIAGNOSIS — R3915 Urgency of urination: Secondary | ICD-10-CM | POA: Diagnosis not present

## 2018-08-14 ENCOUNTER — Ambulatory Visit: Payer: Self-pay | Admitting: Psychology

## 2018-08-26 DIAGNOSIS — F331 Major depressive disorder, recurrent, moderate: Secondary | ICD-10-CM | POA: Diagnosis not present

## 2018-08-26 DIAGNOSIS — F410 Panic disorder [episodic paroxysmal anxiety] without agoraphobia: Secondary | ICD-10-CM | POA: Diagnosis not present

## 2018-08-26 DIAGNOSIS — F332 Major depressive disorder, recurrent severe without psychotic features: Secondary | ICD-10-CM | POA: Diagnosis not present

## 2018-10-26 DIAGNOSIS — F332 Major depressive disorder, recurrent severe without psychotic features: Secondary | ICD-10-CM | POA: Diagnosis not present

## 2018-10-26 DIAGNOSIS — F410 Panic disorder [episodic paroxysmal anxiety] without agoraphobia: Secondary | ICD-10-CM | POA: Diagnosis not present

## 2018-10-26 DIAGNOSIS — F331 Major depressive disorder, recurrent, moderate: Secondary | ICD-10-CM | POA: Diagnosis not present

## 2018-11-01 DIAGNOSIS — Z23 Encounter for immunization: Secondary | ICD-10-CM | POA: Diagnosis not present

## 2018-11-02 DIAGNOSIS — C44319 Basal cell carcinoma of skin of other parts of face: Secondary | ICD-10-CM | POA: Diagnosis not present

## 2018-11-02 DIAGNOSIS — Z85828 Personal history of other malignant neoplasm of skin: Secondary | ICD-10-CM | POA: Diagnosis not present

## 2018-11-02 DIAGNOSIS — L814 Other melanin hyperpigmentation: Secondary | ICD-10-CM | POA: Diagnosis not present

## 2018-11-02 DIAGNOSIS — D485 Neoplasm of uncertain behavior of skin: Secondary | ICD-10-CM | POA: Diagnosis not present

## 2018-11-02 DIAGNOSIS — L309 Dermatitis, unspecified: Secondary | ICD-10-CM | POA: Diagnosis not present

## 2018-11-02 DIAGNOSIS — C44329 Squamous cell carcinoma of skin of other parts of face: Secondary | ICD-10-CM | POA: Diagnosis not present

## 2018-11-02 DIAGNOSIS — D225 Melanocytic nevi of trunk: Secondary | ICD-10-CM | POA: Diagnosis not present

## 2018-11-02 DIAGNOSIS — L821 Other seborrheic keratosis: Secondary | ICD-10-CM | POA: Diagnosis not present

## 2018-12-07 DIAGNOSIS — C44329 Squamous cell carcinoma of skin of other parts of face: Secondary | ICD-10-CM | POA: Diagnosis not present

## 2018-12-21 DIAGNOSIS — C44319 Basal cell carcinoma of skin of other parts of face: Secondary | ICD-10-CM | POA: Diagnosis not present

## 2019-01-15 IMAGING — CT CT HEAD W/O CM
3 series · 16 of 47 positions shown, 19 images · non-contrast
Comparison: 05/30/2017

CLINICAL DATA: Seizure-like activity.  Prostate cancer.

EXAM:
CT HEAD WITHOUT CONTRAST
TECHNIQUE: Contiguous axial images were obtained from the base of the skull
through the vertex without intravenous contrast.

[Series 2: head wo · axial · 0.47mm/px · z∈[+1227,+1362]mm · 10 of 33 slices shown, 13 images]
[im 3/33  brain]
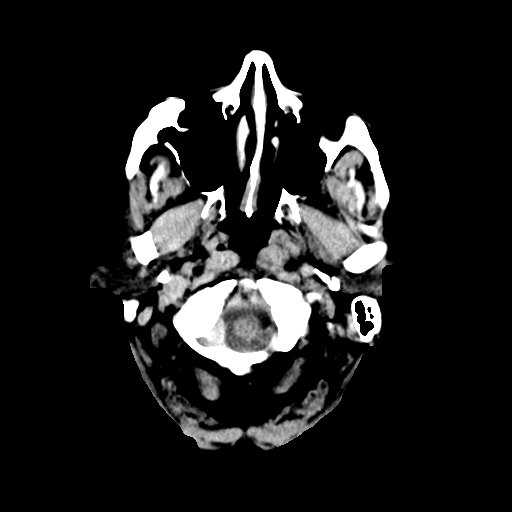
[im 3/33  bone]
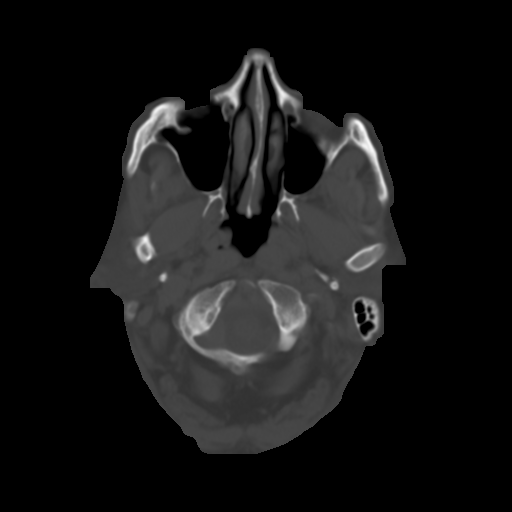
[im 6/33  brain]
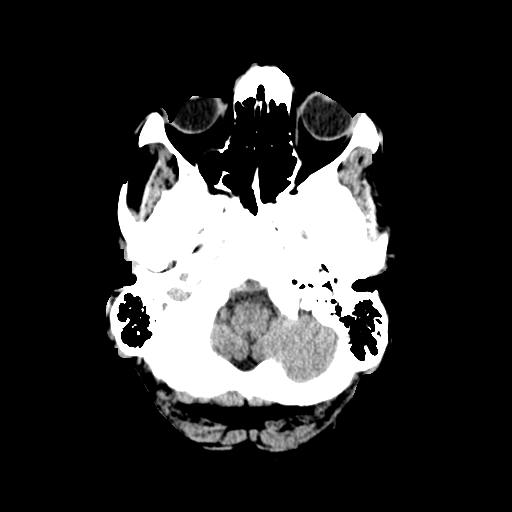
[im 9/33  brain]
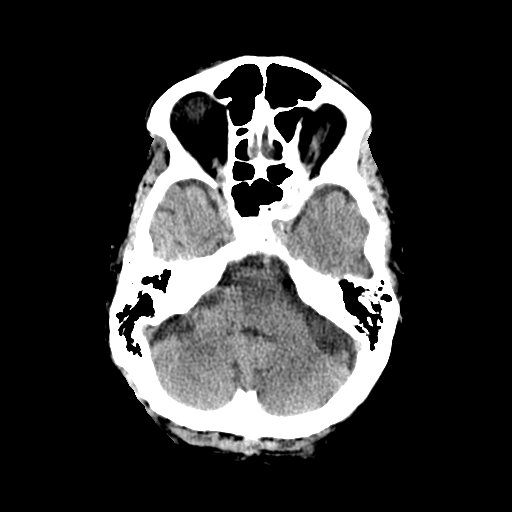
[im 12/33  brain]
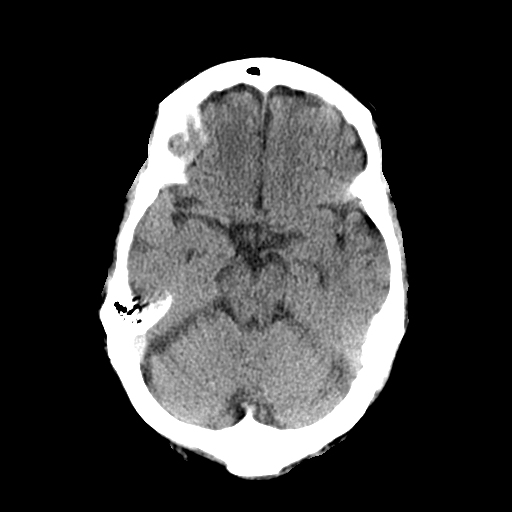
[im 15/33  brain]
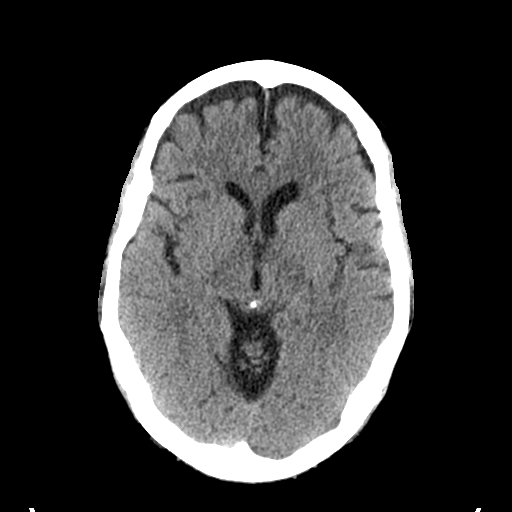
[im 15/33  bone]
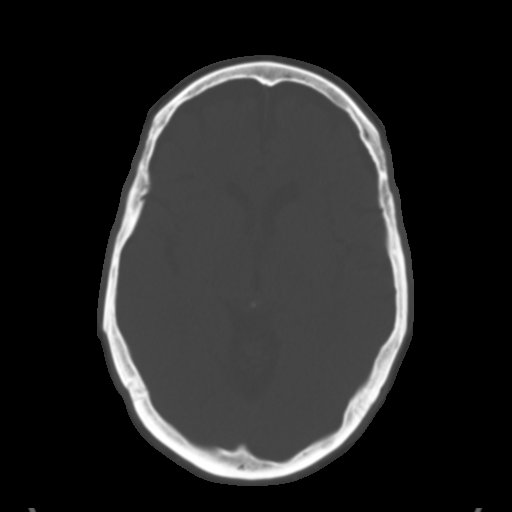
[im 18/33  brain]
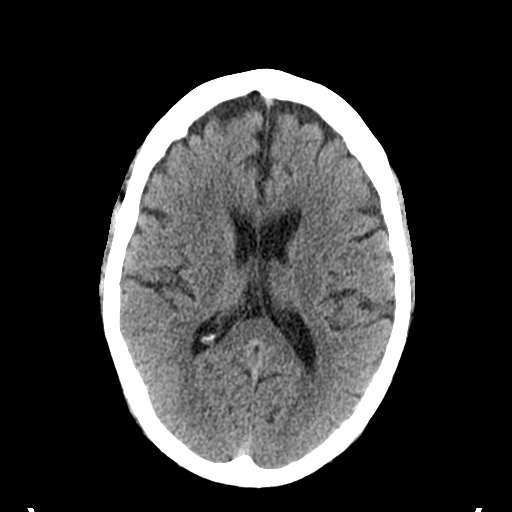
[im 21/33  brain]
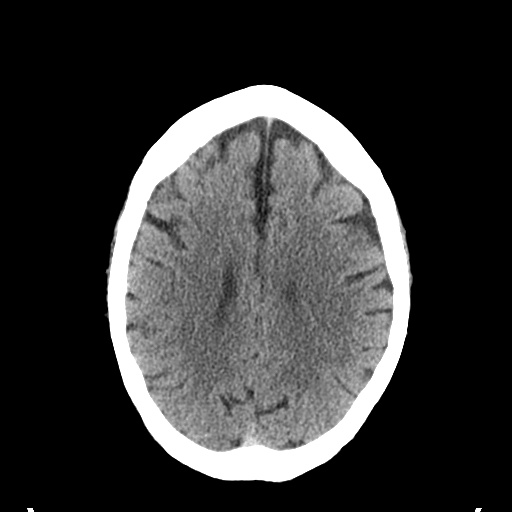
[im 25/33  brain]
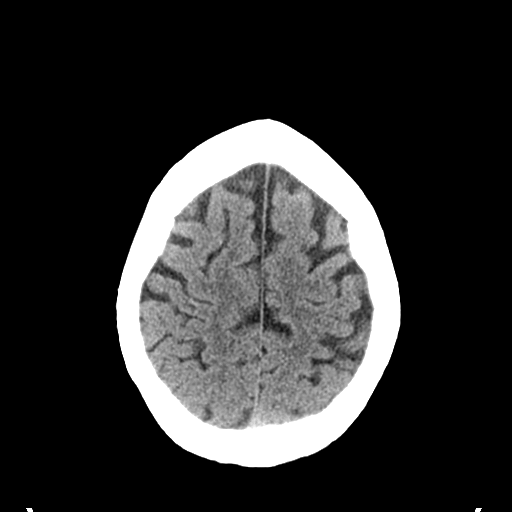
[im 27/33  brain]
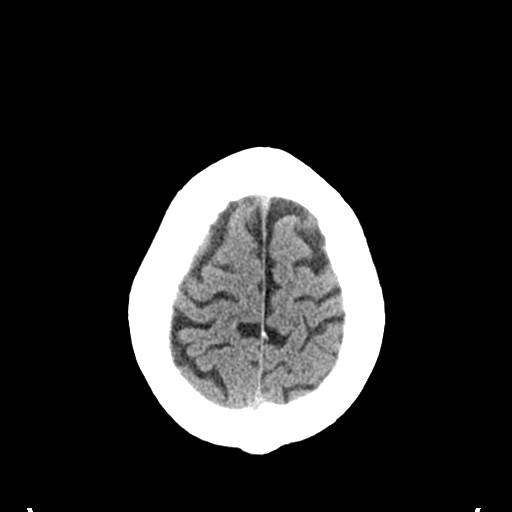
[im 27/33  bone]
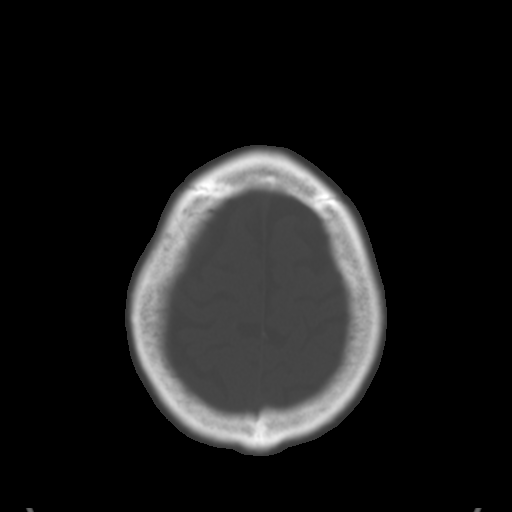
[im 30/33  brain]
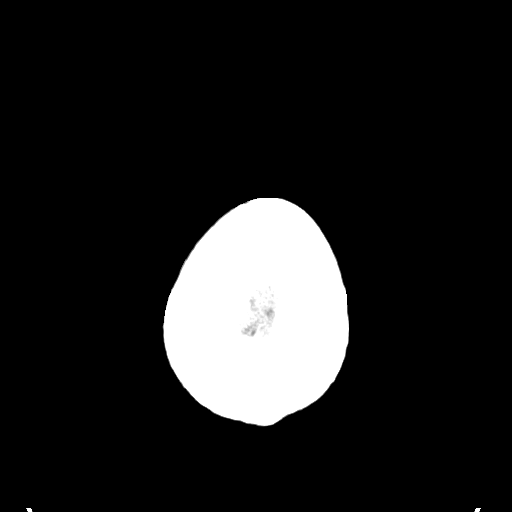

[Series 4: coronal soft tissue · coronal · 0.30mm/px · 3 of 68 slices shown]
[im 23/68  brain]
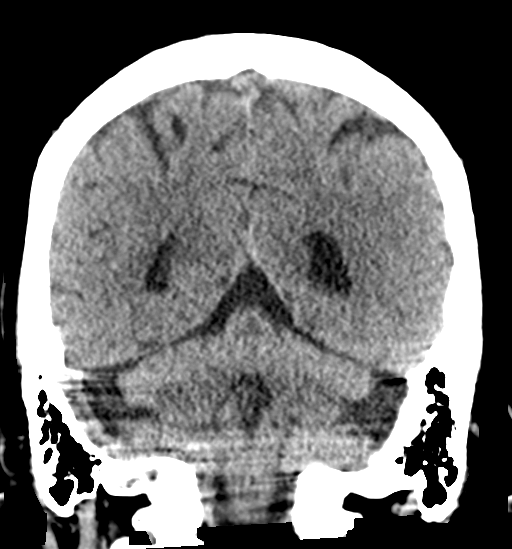
[im 30/68  brain]
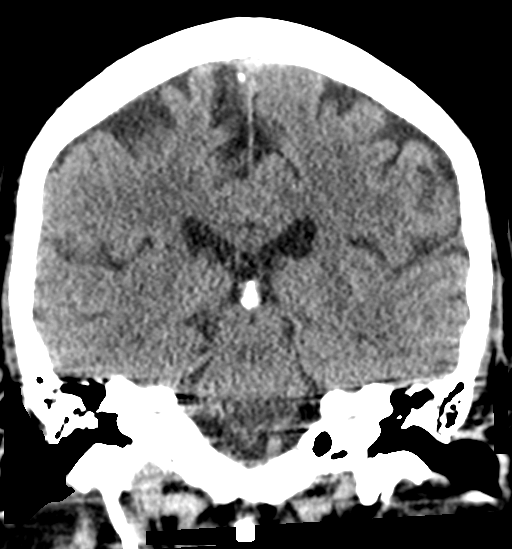
[im 38/68  brain]
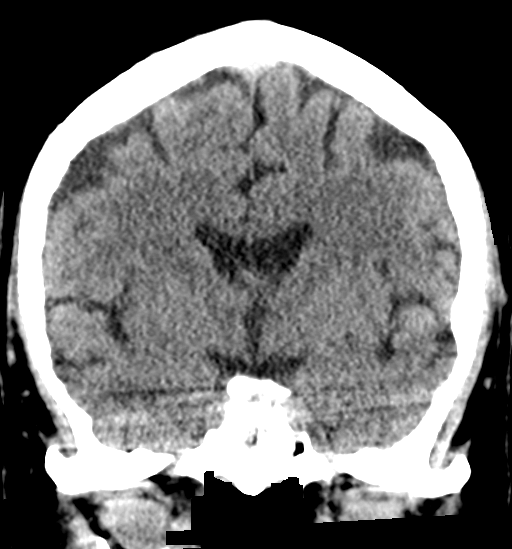

[Series 5: sagittal soft tissue · sagittal · 0.32mm/px · 3 of 50 slices shown]
[im 17/50  brain]
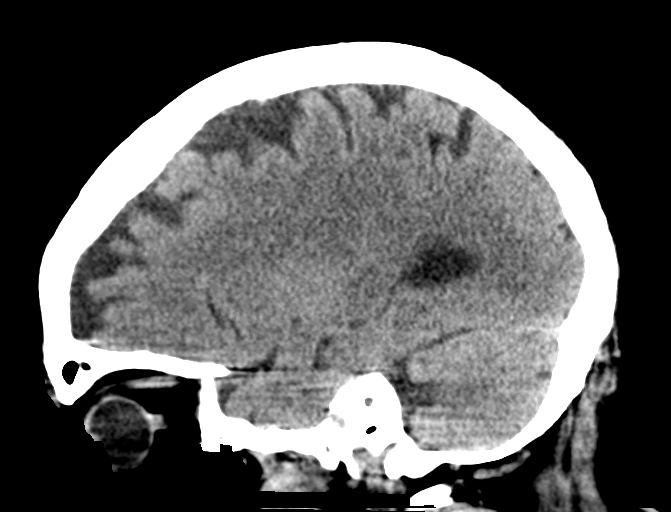
[im 25/50  brain]
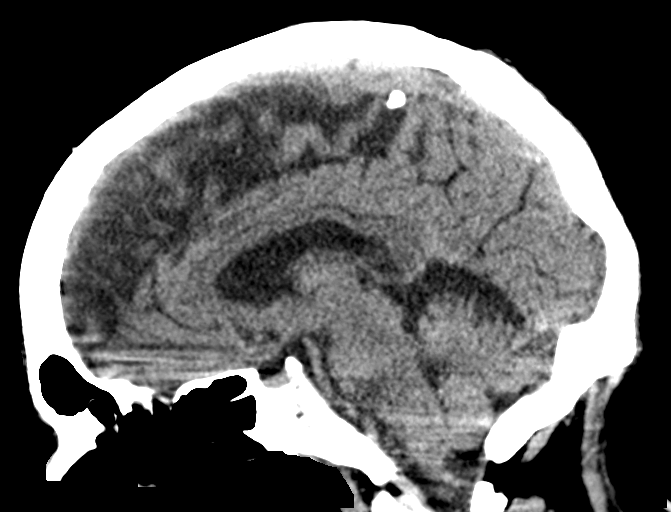
[im 33/50  brain]
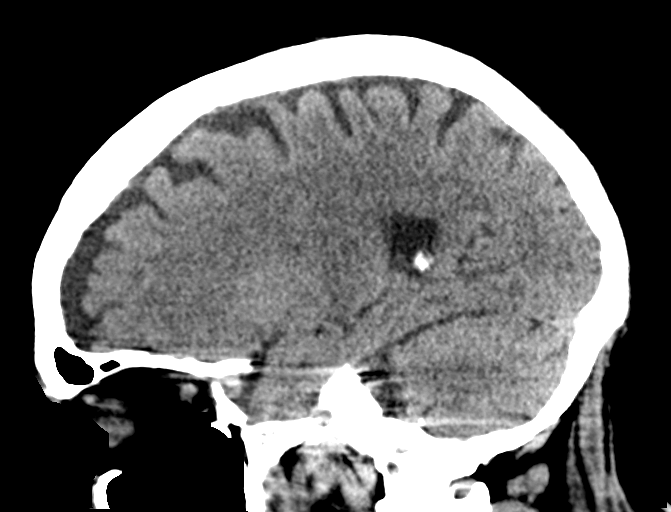

[16 of 47 positions shown; findings below may reference images not displayed]

FINDINGS: Brain: Cerebral and cerebellar atrophy. No mass lesion, hemorrhage,
hydrocephalus, acute infarct, intra-axial, or extra-axial fluid
collection.

Vascular: No hyperdense vessel or unexpected calcification.

Skull: Normal

Sinuses/Orbits: Normal imaged portions of the orbits and globes.
Clear paranasal sinuses and mastoid air cells.

Other: None.
IMPRESSION: No acute intracranial abnormality.

## 2019-01-20 DIAGNOSIS — C44319 Basal cell carcinoma of skin of other parts of face: Secondary | ICD-10-CM | POA: Diagnosis not present

## 2019-01-20 DIAGNOSIS — F331 Major depressive disorder, recurrent, moderate: Secondary | ICD-10-CM | POA: Diagnosis not present

## 2019-01-20 DIAGNOSIS — F332 Major depressive disorder, recurrent severe without psychotic features: Secondary | ICD-10-CM | POA: Diagnosis not present

## 2019-01-20 DIAGNOSIS — F410 Panic disorder [episodic paroxysmal anxiety] without agoraphobia: Secondary | ICD-10-CM | POA: Diagnosis not present

## 2019-02-08 DIAGNOSIS — C61 Malignant neoplasm of prostate: Secondary | ICD-10-CM | POA: Diagnosis not present

## 2019-03-26 ENCOUNTER — Other Ambulatory Visit: Payer: Self-pay | Admitting: Neurology

## 2019-04-03 DIAGNOSIS — Z23 Encounter for immunization: Secondary | ICD-10-CM | POA: Diagnosis not present

## 2019-04-19 ENCOUNTER — Other Ambulatory Visit: Payer: Self-pay | Admitting: *Deleted

## 2019-04-19 DIAGNOSIS — F331 Major depressive disorder, recurrent, moderate: Secondary | ICD-10-CM | POA: Diagnosis not present

## 2019-04-19 DIAGNOSIS — F410 Panic disorder [episodic paroxysmal anxiety] without agoraphobia: Secondary | ICD-10-CM | POA: Diagnosis not present

## 2019-04-19 DIAGNOSIS — F332 Major depressive disorder, recurrent severe without psychotic features: Secondary | ICD-10-CM | POA: Diagnosis not present

## 2019-04-20 ENCOUNTER — Encounter: Payer: Medicare Other | Attending: Psychology | Admitting: Psychology

## 2019-04-20 ENCOUNTER — Other Ambulatory Visit: Payer: Self-pay

## 2019-04-20 DIAGNOSIS — F333 Major depressive disorder, recurrent, severe with psychotic symptoms: Secondary | ICD-10-CM

## 2019-04-20 DIAGNOSIS — R251 Tremor, unspecified: Secondary | ICD-10-CM | POA: Diagnosis not present

## 2019-04-20 DIAGNOSIS — G969 Disorder of central nervous system, unspecified: Secondary | ICD-10-CM | POA: Insufficient documentation

## 2019-04-20 DIAGNOSIS — F09 Unspecified mental disorder due to known physiological condition: Secondary | ICD-10-CM | POA: Insufficient documentation

## 2019-04-20 NOTE — Progress Notes (Signed)
04/20/2019:  Today's visit was an in person clinical interview with the patient.  This is a follow-up from the neuropsychological evaluation that was conducted and completed in May 2020.  The concerns for referral had to do with changes in cognitive functioning and memory loss and after the formal neuropsychological testing it was felt that the primary issues had to do with severe major depressive disorder and general medical issues rather than a progressive degenerative dementia.  I will include the summary from that formal evaluation back in 2020.  We had another clinical interview with the patient today as follow-up 9 months after previous evaluation to assess for any further degenerative processes.  The patient reports that he is feeling much better than when I saw him back in the spring of last year.  The patient reports that they have addressed some of his nutritional issues primarily around B12 as well as adjusting his psychotropic medications.  The patient reports that his mood, sleep and memory are all significantly improved from when I saw him last.  The patient reports that his tremor has improved some but does continue but it is mostly around issues of anxiety.  The patient reports that overall he has not experienced any worsening or change in his subjective symptoms and in fact some of the initial symptoms that led to his concerns for the evaluation have improved significantly since I saw him last year.  At this point, we will not do any further or repeat testing.  We do have good baseline data.  I will follow-up with the patient and be available if he notices any worsening or changes in his subjective memory symptoms or other cognitive changes.   Impression/Diagnosis:Overall, the results of this broad range of cognitive functioning do not suggest any significant or specific diagnostic patterns relative to patterns typically seen with degenerative cortical dementias or  subcortical dementias. The patient had no performances that were impaired relative to his normative comparison groups and all of his performances were in the average to superior range of functioning. The patient did have some weaknesses with regard to auditory and visual memory functions both immediate and delayed relative to his own excellent overall cognitive functioning but it is hard to define these as specific degenerative or loss of function areas. It will be important going forward to do repeat testing in approximately 9 to 12 months to see if there is any further disturbance or decline.  It is clear that the patient has significant psychiatric illness and has been dealing with significant psychiatric illness for his adult life. 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 This is left him with residual chronic posttraumatic stress disorder. The patient also has severe psychiatric illness with regard to major depressive events that have led to complete loss of functioning as well as auditory hallucinations and dissociative experiences during these events. It is possible that some of these are related to a mood disorder in general but the patient denied any events where he developed manic episodes with psychosis and all of his psychotic and dissociative episodes occurred during major depressive events. The patient acknowledges that when his psychiatric illnesses not in an exacerbated state that his overall cognitive functioning is significantly improved. He was not in a significant psychiatric illness state during this testing. The patient also has evidence of tremor which are likely exacerbate some of his anxiety symptoms.  The finding on his MRI of cortical atrophy and small vessel disease  would likely explain his relative weaknesses with regard to some measures of information processing speed and the reduction in his relative  memory function to his otherwise superior cognitive and intellectual abilities.  Again, it will be important for [place] to follow-up with repeat neuropsychological testing in approximately1 year. If there is any acute exacerbation is likely related to more psychiatric illness and a acute phase of his major depressive disorder with psychotic features. It is possible that we are assessing this at an early stage of cortical mediated dementia but at the present time there are no areas of cognitive function that were found to be impaired and outside of at least the average range of functioning relative to his normative comparison group.  Diagnosis:  Severe episode of recurrent major depressive disorder, with psychotic features ([place])  Mild cognitive disorder  Tremor due to disorder of central nervous system   Ilean Skill, Psy.D. Neuropsychologist

## 2019-04-28 DIAGNOSIS — D171 Benign lipomatous neoplasm of skin and subcutaneous tissue of trunk: Secondary | ICD-10-CM | POA: Diagnosis not present

## 2019-05-17 DIAGNOSIS — R079 Chest pain, unspecified: Secondary | ICD-10-CM | POA: Diagnosis not present

## 2019-05-17 DIAGNOSIS — R9431 Abnormal electrocardiogram [ECG] [EKG]: Secondary | ICD-10-CM | POA: Diagnosis not present

## 2019-05-17 DIAGNOSIS — Z20822 Contact with and (suspected) exposure to covid-19: Secondary | ICD-10-CM | POA: Diagnosis not present

## 2019-05-17 DIAGNOSIS — R05 Cough: Secondary | ICD-10-CM | POA: Diagnosis not present

## 2019-05-17 DIAGNOSIS — J069 Acute upper respiratory infection, unspecified: Secondary | ICD-10-CM | POA: Diagnosis not present

## 2019-05-17 DIAGNOSIS — R0789 Other chest pain: Secondary | ICD-10-CM | POA: Diagnosis not present

## 2019-07-13 DIAGNOSIS — F410 Panic disorder [episodic paroxysmal anxiety] without agoraphobia: Secondary | ICD-10-CM | POA: Diagnosis not present

## 2019-07-13 DIAGNOSIS — F331 Major depressive disorder, recurrent, moderate: Secondary | ICD-10-CM | POA: Diagnosis not present

## 2019-07-13 DIAGNOSIS — F332 Major depressive disorder, recurrent severe without psychotic features: Secondary | ICD-10-CM | POA: Diagnosis not present

## 2019-07-26 DIAGNOSIS — M25511 Pain in right shoulder: Secondary | ICD-10-CM | POA: Diagnosis not present

## 2019-07-26 DIAGNOSIS — M542 Cervicalgia: Secondary | ICD-10-CM | POA: Diagnosis not present

## 2019-08-19 ENCOUNTER — Other Ambulatory Visit: Payer: Self-pay | Admitting: Neurology

## 2019-08-25 DIAGNOSIS — C61 Malignant neoplasm of prostate: Secondary | ICD-10-CM | POA: Diagnosis not present

## 2019-10-07 DIAGNOSIS — F410 Panic disorder [episodic paroxysmal anxiety] without agoraphobia: Secondary | ICD-10-CM | POA: Diagnosis not present

## 2019-10-07 DIAGNOSIS — F332 Major depressive disorder, recurrent severe without psychotic features: Secondary | ICD-10-CM | POA: Diagnosis not present

## 2019-10-07 DIAGNOSIS — F331 Major depressive disorder, recurrent, moderate: Secondary | ICD-10-CM | POA: Diagnosis not present

## 2019-11-11 DIAGNOSIS — Z23 Encounter for immunization: Secondary | ICD-10-CM | POA: Diagnosis not present

## 2019-12-02 DIAGNOSIS — Z23 Encounter for immunization: Secondary | ICD-10-CM | POA: Diagnosis not present

## 2019-12-06 ENCOUNTER — Other Ambulatory Visit: Payer: Self-pay | Admitting: Neurology

## 2020-01-04 DIAGNOSIS — F410 Panic disorder [episodic paroxysmal anxiety] without agoraphobia: Secondary | ICD-10-CM | POA: Diagnosis not present

## 2020-01-04 DIAGNOSIS — F331 Major depressive disorder, recurrent, moderate: Secondary | ICD-10-CM | POA: Diagnosis not present

## 2020-01-04 DIAGNOSIS — F332 Major depressive disorder, recurrent severe without psychotic features: Secondary | ICD-10-CM | POA: Diagnosis not present

## 2020-03-22 DIAGNOSIS — F332 Major depressive disorder, recurrent severe without psychotic features: Secondary | ICD-10-CM | POA: Diagnosis not present

## 2020-03-22 DIAGNOSIS — F331 Major depressive disorder, recurrent, moderate: Secondary | ICD-10-CM | POA: Diagnosis not present

## 2020-03-22 DIAGNOSIS — F410 Panic disorder [episodic paroxysmal anxiety] without agoraphobia: Secondary | ICD-10-CM | POA: Diagnosis not present

## 2020-05-18 DIAGNOSIS — H5203 Hypermetropia, bilateral: Secondary | ICD-10-CM | POA: Diagnosis not present

## 2020-05-18 DIAGNOSIS — H25813 Combined forms of age-related cataract, bilateral: Secondary | ICD-10-CM | POA: Diagnosis not present

## 2020-06-14 DIAGNOSIS — F331 Major depressive disorder, recurrent, moderate: Secondary | ICD-10-CM | POA: Diagnosis not present

## 2020-06-14 DIAGNOSIS — F410 Panic disorder [episodic paroxysmal anxiety] without agoraphobia: Secondary | ICD-10-CM | POA: Diagnosis not present

## 2020-06-14 DIAGNOSIS — F332 Major depressive disorder, recurrent severe without psychotic features: Secondary | ICD-10-CM | POA: Diagnosis not present

## 2020-08-28 DIAGNOSIS — L570 Actinic keratosis: Secondary | ICD-10-CM | POA: Diagnosis not present

## 2020-08-28 DIAGNOSIS — C4441 Basal cell carcinoma of skin of scalp and neck: Secondary | ICD-10-CM | POA: Diagnosis not present

## 2020-08-28 DIAGNOSIS — D485 Neoplasm of uncertain behavior of skin: Secondary | ICD-10-CM | POA: Diagnosis not present

## 2020-08-30 DIAGNOSIS — C61 Malignant neoplasm of prostate: Secondary | ICD-10-CM | POA: Diagnosis not present

## 2020-09-06 DIAGNOSIS — Z8546 Personal history of malignant neoplasm of prostate: Secondary | ICD-10-CM | POA: Diagnosis not present

## 2020-09-06 DIAGNOSIS — R351 Nocturia: Secondary | ICD-10-CM | POA: Diagnosis not present

## 2020-09-06 DIAGNOSIS — N401 Enlarged prostate with lower urinary tract symptoms: Secondary | ICD-10-CM | POA: Diagnosis not present

## 2020-09-07 DIAGNOSIS — F332 Major depressive disorder, recurrent severe without psychotic features: Secondary | ICD-10-CM | POA: Diagnosis not present

## 2020-09-07 DIAGNOSIS — F410 Panic disorder [episodic paroxysmal anxiety] without agoraphobia: Secondary | ICD-10-CM | POA: Diagnosis not present

## 2020-09-07 DIAGNOSIS — F331 Major depressive disorder, recurrent, moderate: Secondary | ICD-10-CM | POA: Diagnosis not present

## 2020-10-05 DIAGNOSIS — C4441 Basal cell carcinoma of skin of scalp and neck: Secondary | ICD-10-CM | POA: Diagnosis not present

## 2020-12-05 DIAGNOSIS — F332 Major depressive disorder, recurrent severe without psychotic features: Secondary | ICD-10-CM | POA: Diagnosis not present

## 2020-12-05 DIAGNOSIS — F331 Major depressive disorder, recurrent, moderate: Secondary | ICD-10-CM | POA: Diagnosis not present

## 2020-12-05 DIAGNOSIS — F410 Panic disorder [episodic paroxysmal anxiety] without agoraphobia: Secondary | ICD-10-CM | POA: Diagnosis not present

## 2021-02-13 DIAGNOSIS — F332 Major depressive disorder, recurrent severe without psychotic features: Secondary | ICD-10-CM | POA: Diagnosis not present

## 2021-02-13 DIAGNOSIS — F410 Panic disorder [episodic paroxysmal anxiety] without agoraphobia: Secondary | ICD-10-CM | POA: Diagnosis not present

## 2021-02-13 DIAGNOSIS — F331 Major depressive disorder, recurrent, moderate: Secondary | ICD-10-CM | POA: Diagnosis not present

## 2021-02-13 DIAGNOSIS — E889 Metabolic disorder, unspecified: Secondary | ICD-10-CM | POA: Diagnosis not present

## 2024-01-23 ENCOUNTER — Inpatient Hospital Stay (HOSPITAL_COMMUNITY)
Admission: EM | Admit: 2024-01-23 | Discharge: 2024-01-26 | DRG: 660 | Disposition: A | Discharge status: Home / Self Care | Source: Ambulatory Visit | Attending: Internal Medicine | Admitting: Internal Medicine

## 2024-01-23 ENCOUNTER — Encounter (HOSPITAL_COMMUNITY): Payer: Self-pay | Admitting: Internal Medicine

## 2024-01-23 ENCOUNTER — Emergency Department (HOSPITAL_COMMUNITY)

## 2024-01-23 ENCOUNTER — Other Ambulatory Visit: Payer: Self-pay

## 2024-01-23 DIAGNOSIS — N132 Hydronephrosis with renal and ureteral calculous obstruction: Secondary | ICD-10-CM | POA: Diagnosis not present

## 2024-01-23 DIAGNOSIS — F3181 Bipolar II disorder: Secondary | ICD-10-CM | POA: Diagnosis present

## 2024-01-23 DIAGNOSIS — R319 Hematuria, unspecified: Secondary | ICD-10-CM

## 2024-01-23 DIAGNOSIS — N201 Calculus of ureter: Secondary | ICD-10-CM | POA: Diagnosis not present

## 2024-01-23 DIAGNOSIS — F332 Major depressive disorder, recurrent severe without psychotic features: Secondary | ICD-10-CM | POA: Diagnosis present

## 2024-01-23 DIAGNOSIS — F418 Other specified anxiety disorders: Secondary | ICD-10-CM | POA: Diagnosis not present

## 2024-01-23 DIAGNOSIS — E669 Obesity, unspecified: Secondary | ICD-10-CM | POA: Diagnosis present

## 2024-01-23 DIAGNOSIS — N136 Pyonephrosis: Principal | ICD-10-CM | POA: Diagnosis present

## 2024-01-23 DIAGNOSIS — N390 Urinary tract infection, site not specified: Secondary | ICD-10-CM

## 2024-01-23 DIAGNOSIS — Z79899 Other long term (current) drug therapy: Secondary | ICD-10-CM

## 2024-01-23 DIAGNOSIS — F0393 Unspecified dementia, unspecified severity, with mood disturbance: Secondary | ICD-10-CM | POA: Diagnosis present

## 2024-01-23 DIAGNOSIS — F0390 Unspecified dementia without behavioral disturbance: Secondary | ICD-10-CM | POA: Diagnosis present

## 2024-01-23 DIAGNOSIS — N179 Acute kidney failure, unspecified: Secondary | ICD-10-CM | POA: Diagnosis present

## 2024-01-23 DIAGNOSIS — Z8546 Personal history of malignant neoplasm of prostate: Secondary | ICD-10-CM

## 2024-01-23 DIAGNOSIS — Z96653 Presence of artificial knee joint, bilateral: Secondary | ICD-10-CM | POA: Diagnosis present

## 2024-01-23 DIAGNOSIS — F419 Anxiety disorder, unspecified: Secondary | ICD-10-CM | POA: Diagnosis present

## 2024-01-23 DIAGNOSIS — B952 Enterococcus as the cause of diseases classified elsewhere: Secondary | ICD-10-CM | POA: Diagnosis present

## 2024-01-23 DIAGNOSIS — E538 Deficiency of other specified B group vitamins: Secondary | ICD-10-CM | POA: Diagnosis present

## 2024-01-23 DIAGNOSIS — R569 Unspecified convulsions: Secondary | ICD-10-CM

## 2024-01-23 DIAGNOSIS — Z6834 Body mass index (BMI) 34.0-34.9, adult: Secondary | ICD-10-CM | POA: Diagnosis not present

## 2024-01-23 DIAGNOSIS — E872 Acidosis, unspecified: Principal | ICD-10-CM | POA: Diagnosis present

## 2024-01-23 DIAGNOSIS — N200 Calculus of kidney: Secondary | ICD-10-CM

## 2024-01-23 DIAGNOSIS — Z8042 Family history of malignant neoplasm of prostate: Secondary | ICD-10-CM | POA: Diagnosis not present

## 2024-01-23 HISTORY — DX: Depression, unspecified: F32.A

## 2024-01-23 LAB — URINALYSIS, ROUTINE W REFLEX MICROSCOPIC
Bilirubin Urine: NEGATIVE
Glucose, UA: NEGATIVE mg/dL
Ketones, ur: NEGATIVE mg/dL
Nitrite: NEGATIVE
Protein, ur: 100 mg/dL — AB
RBC / HPF: >50 RBC/hpf (ref 0–5)
Specific Gravity, Urine: 1.019 (ref 1.005–1.030)
WBC, UA: >50 WBC/hpf (ref 0–5)
pH: 6 (ref 5.0–8.0)

## 2024-01-23 LAB — COMPREHENSIVE METABOLIC PANEL WITH GFR
ALT: 13 U/L (ref 0–44)
AST: 17 U/L (ref 15–41)
Albumin: 3.7 g/dL (ref 3.5–5.0)
Alkaline Phosphatase: 71 U/L (ref 38–126)
Anion gap: 12 (ref 5–15)
BUN: 25 mg/dL — ABNORMAL HIGH (ref 8–23)
CO2: 22 mmol/L (ref 22–32)
Calcium: 9.4 mg/dL (ref 8.9–10.3)
Chloride: 98 mmol/L (ref 98–111)
Creatinine, Ser: 1.75 mg/dL — ABNORMAL HIGH (ref 0.61–1.24)
GFR, Estimated: 39 mL/min — ABNORMAL LOW (ref >60)
Glucose, Bld: 185 mg/dL — ABNORMAL HIGH (ref 70–99)
Potassium: 4.2 mmol/L (ref 3.5–5.1)
Sodium: 131 mmol/L — ABNORMAL LOW (ref 135–145)
Total Bilirubin: 1.2 mg/dL (ref 0.0–1.2)
Total Protein: 6.7 g/dL (ref 6.5–8.1)

## 2024-01-23 LAB — CBC WITH DIFFERENTIAL/PLATELET
Abs Immature Granulocytes: 0.09 K/uL — ABNORMAL HIGH (ref 0.00–0.07)
Basophils Absolute: 0 K/uL (ref 0.0–0.1)
Basophils Relative: 0 %
Eosinophils Absolute: 0 K/uL (ref 0.0–0.5)
Eosinophils Relative: 0 %
HCT: 40.7 % (ref 39.0–52.0)
Hemoglobin: 13.4 g/dL (ref 13.0–17.0)
Immature Granulocytes: 1 %
Lymphocytes Relative: 2 %
Lymphs Abs: 0.2 K/uL — ABNORMAL LOW (ref 0.7–4.0)
MCH: 29 pg (ref 26.0–34.0)
MCHC: 32.9 g/dL (ref 30.0–36.0)
MCV: 88.1 fL (ref 80.0–100.0)
Monocytes Absolute: 0.5 K/uL (ref 0.1–1.0)
Monocytes Relative: 5 %
Neutro Abs: 10.4 K/uL — ABNORMAL HIGH (ref 1.7–7.7)
Neutrophils Relative %: 92 %
Platelets: 110 K/uL — ABNORMAL LOW (ref 150–400)
RBC: 4.62 MIL/uL (ref 4.22–5.81)
RDW: 14.7 % (ref 11.5–15.5)
WBC: 11.2 K/uL — ABNORMAL HIGH (ref 4.0–10.5)
nRBC: 0 % (ref 0.0–0.2)

## 2024-01-23 LAB — I-STAT CG4 LACTIC ACID, ED
Lactic Acid, Venous: 2.1 mmol/L (ref 0.5–1.9)
Lactic Acid, Venous: 0.8 mmol/L (ref 0.5–1.9)

## 2024-01-23 MED ORDER — SODIUM CHLORIDE 0.9 % IV BOLUS
30.0000 mL/kg | Freq: Once | INTRAVENOUS | Status: AC
  Administered 2024-01-23: 3606 mL via INTRAVENOUS

## 2024-01-23 MED ORDER — SODIUM CHLORIDE 0.9 % IV SOLN
1.0000 g | INTRAVENOUS | Status: DC
  Administered 2024-01-24: 1 g via INTRAVENOUS
  Filled 2024-01-23: qty 10

## 2024-01-23 MED ORDER — HYDROMORPHONE HCL 1 MG/ML IJ SOLN
0.5000 mg | INTRAMUSCULAR | Status: DC | PRN
  Administered 2024-01-24 – 2024-01-26 (×9): 0.5 mg via INTRAVENOUS
  Filled 2024-01-23 (×9): qty 0.5

## 2024-01-23 MED ORDER — SODIUM CHLORIDE 0.9 % IV SOLN: Freq: Once | INTRAVENOUS | Status: AC

## 2024-01-23 MED ORDER — ONDANSETRON HCL 4 MG/2ML IJ SOLN: 4.0000 mg | Freq: Four times a day (QID) | INTRAMUSCULAR | Status: DC | PRN

## 2024-01-23 MED ORDER — ACETAMINOPHEN 650 MG RE SUPP: 650.0000 mg | Freq: Four times a day (QID) | RECTAL | Status: DC | PRN

## 2024-01-23 MED ORDER — MORPHINE SULFATE (PF) 4 MG/ML IV SOLN
4.0000 mg | Freq: Once | INTRAVENOUS | Status: AC
  Administered 2024-01-23: 4 mg via INTRAVENOUS
  Filled 2024-01-23: qty 1

## 2024-01-23 MED ORDER — BISACODYL 5 MG PO TBEC
5.0000 mg | DELAYED_RELEASE_TABLET | Freq: Every day | ORAL | Status: DC | PRN
  Administered 2024-01-25: 5 mg via ORAL
  Filled 2024-01-23: qty 1

## 2024-01-23 MED ORDER — SODIUM CHLORIDE 0.9 % IV BOLUS
1000.0000 mL | Freq: Once | INTRAVENOUS | Status: AC
  Administered 2024-01-23: 1000 mL via INTRAVENOUS

## 2024-01-23 MED ORDER — SODIUM CHLORIDE 0.9% FLUSH
3.0000 mL | Freq: Two times a day (BID) | INTRAVENOUS | Status: DC
  Administered 2024-01-23 – 2024-01-26 (×4): 3 mL via INTRAVENOUS

## 2024-01-23 MED ORDER — ONDANSETRON HCL 4 MG/2ML IJ SOLN
4.0000 mg | Freq: Once | INTRAMUSCULAR | Status: AC
  Administered 2024-01-23: 4 mg via INTRAVENOUS
  Filled 2024-01-23: qty 2

## 2024-01-23 MED ORDER — SODIUM CHLORIDE 0.9 % IV SOLN
1.0000 g | Freq: Once | INTRAVENOUS | Status: AC
  Administered 2024-01-23: 1 g via INTRAVENOUS
  Filled 2024-01-23: qty 10

## 2024-01-23 MED ORDER — ACETAMINOPHEN 325 MG PO TABS
650.0000 mg | ORAL_TABLET | Freq: Four times a day (QID) | ORAL | Status: DC | PRN
  Administered 2024-01-25: 650 mg via ORAL
  Filled 2024-01-23: qty 2

## 2024-01-23 MED ORDER — ONDANSETRON HCL 4 MG PO TABS: 4.0000 mg | ORAL_TABLET | Freq: Four times a day (QID) | ORAL | Status: DC | PRN

## 2024-01-23 NOTE — ED Triage Notes (Signed)
 Lower back pain. Burning with urination. Previous prostate cancer. Symptoms started 7-10 days ago. Minimal blood in urine a few days ago, none now. Concerned he may have passed a kidney stone. Fever of 102F this morning.

## 2024-01-23 NOTE — H&P (View-Only) (Signed)
 Urology Consult Note   Requesting Attending Physician:  Garrick Charleston, MD Service Providing Consult: Urology  Consulting Attending: Dr. Sherrilee   Reason for Consult: Ureteral stone with lactic acidosis  HPI: Calvin Santiago is seen in consultation for reasons noted above at the request of Garrick Charleston, MD. Patient is a 78 y.o. male presenting with worsening left flank pain, hematuria, fever, and lactic acidosis.  He denies previous history with nephrolithiasis.  He initially thought his lower back pain was spinal and visited his orthopedist who confirmed that it was not spinal in nature and directed him to the emergency department.  On arrival CT A/P noted 9 mm left UVJ stone, lactic acidosis of 2.1, mild leukocytosis, and patient was afebrile.  On my arrival he was alert, oriented, and resting comfortably in bed after administration of pain medication.  We reviewed the natural history of stone production and available treatment options to him including infographic's.  All questions were answered to their satisfaction. ------------------  Assessment:   78 y.o. male with 9 mm left UVJ stone.   Recommendations: # Left UVJ stone # Lactic acidosis # UTI?  His lactic acidosis resolved in about an hour and a half with fluids, this most likely represents dehydration over the septic picture.  Reports almost no oral intake for 48 hours. Hemodynamically stable and afebrile Trend labs Agree with broad ABX in light of UA Strain all urine N.p.o. at midnight Reevaluate for cystoscopy with basket extraction versus ureteral stent placement tomorrow morning or if he begins fevering/decompensates overnight  Case and plan discussed with patient, wife, Dr. Sherrilee, ED team  Past Medical History: Past Medical History:  Diagnosis Date   Anxiety    Bipolar 2 disorder (HCC)    Prostate cancer [facility])     Past Surgical History:  Past Surgical History:  Procedure Laterality Date   PROSTATE  BIOPSY     RADIOLOGY WITH ANESTHESIA N/A 01/22/2018   Procedure: MRI of the brain WITH ANESTHESIA;  Surgeon: Radiologist, Medication, MD;  Location: [facility] OR;  Service: Radiology;  Laterality: N/A;   REPLACEMENT TOTAL KNEE     bilateral    Medication: Current Facility-Administered Medications  Medication Dose Route Frequency Provider Last Rate Last Admin   sodium chloride  0.9 % bolus 3,606 mL  30 mL/kg Intravenous Once Garrick Charleston, MD       Current Outpatient Medications  Medication Sig Dispense Refill   acetaminophen  (TYLENOL ) 500 MG tablet Take 1,000 mg by mouth every 6 (six) hours as needed for moderate pain.     amantadine  (SYMMETREL ) 100 MG capsule      amLODipine  (NORVASC ) 5 MG tablet Take 5 mg by mouth daily.     divalproex  (DEPAKOTE ) 250 MG DR tablet      folic acid  (FOLVITE ) 1 MG tablet Take 1 tablet (1 mg total) by mouth daily. 90 tablet 4   QUEtiapine  (SEROQUEL ) 100 MG tablet      sertraline  (ZOLOFT ) 100 MG tablet Take 200 mg by mouth daily.      vitamin B-12 (CYANOCOBALAMIN ) 1000 MCG tablet Take 1 tablet (1,000 mcg total) by mouth daily. 30 tablet 3    Allergies: Allergies  Allergen Reactions   Sulfa Antibiotics Other (See Comments)    As a child, he ran into walls    Social History: Social History   Tobacco Use   Smoking status: Never   Smokeless tobacco: Never  Vaping Use   Vaping status: Unknown  Substance Use Topics   Alcohol  use: Never   Drug use: Never    Family History Family History  Problem Relation Age of Onset   Prostate cancer Father    Prostate cancer Brother    Prostate cancer Maternal Grandfather    Breast cancer Neg Hx    Pancreatic cancer Neg Hx    Colon cancer Neg Hx     Review of Systems  Genitourinary:  Positive for hematuria. Negative for dysuria, flank pain, frequency and urgency.     Objective   Vital signs in last 24 hours: BP 135/83   Pulse 73   Temp 97.9 F (36.6 C)   Resp 20   Ht 6' 2 (1.88 m)   Wt 120.2 kg    SpO2 95%   BMI 34.02 kg/m   Physical Exam General: A&O, resting, appropriate HEENT: [place]/AT Pulmonary: Normal work of breathing Cardiovascular: no cyanosis    Most Recent Labs: Lab Results  Component Value Date   WBC 11.2 (H) 01/23/2024   HGB 13.4 01/23/2024   HCT 40.7 01/23/2024   PLT 110 (L) 01/23/2024    Lab Results  Component Value Date   NA 131 (L) 01/23/2024   K 4.2 01/23/2024   CL 98 01/23/2024   CO2 22 01/23/2024   BUN 25 (H) 01/23/2024   CREATININE 1.75 (H) 01/23/2024   CALCIUM  9.4 01/23/2024    No results found for: INR, APTT   Urine Culture: @[id number] (laburin,org,[id number])@   IMAGING: CT Renal Stone Study Result Date: 01/23/2024 EXAM: CT ABDOMEN AND PELVIS WITHOUT CONTRAST 01/23/2024 12:50:09 PM TECHNIQUE: CT of the abdomen and pelvis was performed without the administration of intravenous contrast. Multiplanar reformatted images are provided for review. Automated exposure control, iterative reconstruction, and/or weight-based adjustment of the mA/kV was utilized to reduce the radiation dose to as low as reasonably achievable. COMPARISON: Prostate MRI of 06/13/2017. No prior CT. CLINICAL HISTORY: Abdominal/flank pain, stone suspected. FINDINGS: LOWER CHEST: Normal heart size. Minimal bibasilar scarring. LIVER: Mild hepatic steatosis. Bilateral low density liver lesions, the larger lesions include up to 4.4 cm, are likely cysts. Other smaller lesions are too small to characterize but also most likely cysts. Nonspecific moderate caudate lobe enlargement. No intrahepatic biliary duct dilatation. GALLBLADDER AND BILE DUCTS: Gallbladder is unremarkable. No biliary ductal dilatation. SPLEEN: Normal in size and morphology. PANCREAS: Fatty replacement involving the pancreatic neck, head, uncinate process. No duct dilatation or acute inflammation. ADRENAL GLANDS: No acute abnormality. KIDNEYS, URETERS AND BLADDER: Multiple low density left renal lesions including  up to 5.8 cm are likely cysts. Moderate left sided hydroureteronephrosis to the level of a 9 mm stone at the left ureterovesicular junction on image 86/2. Moderate to marked left perinephric interstitial thickening may represent forniceal rupture. A right sided bladder base stone measuring 4 mm. Diverticulum off the bladder dome including at 8.7 cm. GI AND BOWEL: Normal stomach, without wall thickening. Normal small bowel caliber. Scattered colonic diverticula. Normal appendix. There is no bowel obstruction. PERITONEUM AND RETROPERITONEUM: No ascites. No free air. VASCULATURE: Aorta is normal in caliber. Aortic atherosclerosis. LYMPH NODES: No lymphadenopathy. REPRODUCTIVE ORGANS: Mild prostatomegaly. Radiation seeds within. BONES AND SOFT TISSUES: No acute osseous abnormality. Small fat containing right inguinal hernia. Tiny fat containing periumbilical ventral abdominal wall hernia. No focal soft tissue abnormality. IMPRESSION: 1. Moderate left sided hydroureteronephrosis secondary to a 9 mm stone at the left ureterovesical junction. 2. Right sided bladder base stone. 3. Left perinephric interstitial thickening could simply be secondary to hydronephrosis or superimposed forniceal rupture. 4. Mild prostatomegaly  with bladder dome diverticulum, suggesting outlet obstruction. 5. Incidental findings, including: Aortic atherosclerosis ([id number]). Hepatic steatosis. Electronically signed by: Rockey Kilts MD 01/23/2024 01:32 PM EST RP Workstation: [id number]    ------  Ole Bourdon, NP Pager: [phone]   Please contact the urology consult pager with any further questions/concerns.

## 2024-01-23 NOTE — H&P (Signed)
 History and Physical    Patient: Calvin Santiago FMW:[id number] DOB: 04-13-1945 DOA: 01/23/2024 DOS: the patient was seen and examined on 01/23/2024 PCP: Kip Righter, MD  Patient coming from: Home  Chief Complaint:  Chief Complaint  Patient presents with   Back Pain   HPI: Calvin Santiago is a 78 y.o. male with medical history significant for bipolar disorder and prostate cancer 2 years ago.  The patient has been struggling with left-sided back pain for the last 10 days.  He has seen an orthopedic doctor.  He has been taking pain medication.  He has plans to start physical therapy.  He has also been having trouble with burning with urination and feeling like not a lot of urine is coming out when he does go. Earlier in the week he had a day or so where he could not urinate at all where nothing would come out.  That resolved but he still urinating very small amounts.  This morning at 5 AM he started sweating profusely.  He had a temperature of 102.  His wife called the orthopedic doctor who recommended that he come to the emergency department. In the ER his workup included a renal CT and he was found to have left-sided hydronephrosis with a 9 mm ureter stone.  He did not have a fever or elevated white blood cell count in the ED but urology has been called and the patient will be admitted.  Review of Systems: As mentioned in the history of present illness. All other systems reviewed and are negative. Past Medical History:  Diagnosis Date   Anxiety    Bipolar 2 disorder (HCC)    Prostate cancer [facility])    Past Surgical History:  Procedure Laterality Date   PROSTATE BIOPSY     RADIOLOGY WITH ANESTHESIA N/A 01/22/2018   Procedure: MRI of the brain WITH ANESTHESIA;  Surgeon: Radiologist, Medication, MD;  Location: [facility] OR;  Service: Radiology;  Laterality: N/A;   REPLACEMENT TOTAL KNEE     bilateral   Social History:  reports that he has never smoked. He has never used smokeless tobacco. He reports  that he does not drink alcohol and does not use drugs.  Allergies  Allergen Reactions   Sulfa Antibiotics Other (See Comments)    As a child, he ran into walls    Family History  Problem Relation Age of Onset   Prostate cancer Father    Prostate cancer Brother    Prostate cancer Maternal Grandfather    Breast cancer Neg Hx    Pancreatic cancer Neg Hx    Colon cancer Neg Hx     Prior to Admission medications   Medication Sig Start Date End Date Taking? Authorizing Provider  acetaminophen  (TYLENOL ) 500 MG tablet Take 1,000 mg by mouth every 6 (six) hours as needed for moderate pain.    [provider]  amantadine  (SYMMETREL ) 100 MG capsule  02/06/18   [provider]  amLODipine  (NORVASC ) 5 MG tablet Take 5 mg by mouth daily. 12/13/17   [provider]  divalproex  (DEPAKOTE ) 250 MG DR tablet  02/06/18   [provider]  folic acid  (FOLVITE ) 1 MG tablet Take 1 tablet (1 mg total) by mouth daily. 02/16/18   Onita Duos, MD  QUEtiapine  (SEROQUEL ) 100 MG tablet  02/06/18   [provider]  sertraline  (ZOLOFT ) 100 MG tablet Take 200 mg by mouth daily.     [provider]  vitamin B-12 (CYANOCOBALAMIN ) 1000 MCG tablet Take  1 tablet (1,000 mcg total) by mouth daily. 01/27/18   Drusilla Sabas RAMAN, MD    Physical Exam: Vitals:   01/23/24 1413 01/23/24 1616 01/23/24 1821 01/23/24 1930  BP: 135/83 (!) 136/93 (!) 147/90 (!) 156/100  Pulse: 73 83 86 75  Resp: 20 18 18 17   Temp: 97.9 F (36.6 C) 97.8 F (36.6 C) 97.9 F (36.6 C)   TempSrc:      SpO2: 95% 98% 98% 100%  Weight:      Height:       Physical Exam:  General: No acute distress, well developed HEENT: Normocephalic, atraumatic, PERRL Cardiovascular: Normal rate and rhythm. Distal pulses intact. Pulmonary: Normal pulmonary effort, normal breath sounds Gastrointestinal: Distended abdomen, full, obese tender in left back/flank, normoactive bowel sounds Musculoskeletal:Normal ROM,  no lower ext edema Skin: Skin is warm and dry. Neuro: No focal deficits noted, AAOx3. PSYCH: Attentive and cooperative  Data Reviewed:  Results for orders placed or performed during the hospital encounter of 01/23/24 (from the past 24 hours)  Urinalysis, Routine w reflex microscopic -Urine, Clean Catch     Status: Abnormal   Collection Time: 01/23/24 11:40 AM  Result Value Ref Range   Color, Urine AMBER (A) YELLOW   APPearance TURBID (A) CLEAR   Specific Gravity, Urine 1.019 1.005 - 1.030   pH 6.0 5.0 - 8.0   Glucose, UA NEGATIVE NEGATIVE mg/dL   Hgb urine dipstick MODERATE (A) NEGATIVE   Bilirubin Urine NEGATIVE NEGATIVE   Ketones, ur NEGATIVE NEGATIVE mg/dL   Protein, ur 899 (A) NEGATIVE mg/dL   Nitrite NEGATIVE NEGATIVE   Leukocytes,Ua MODERATE (A) NEGATIVE   RBC / HPF >50 0 - 5 RBC/hpf   WBC, UA >50 0 - 5 WBC/hpf   Bacteria, UA MANY (A) NONE SEEN   Squamous Epithelial / HPF 0-5 0 - 5 /HPF   WBC Clumps PRESENT    Mucus PRESENT   Comprehensive metabolic panel     Status: Abnormal   Collection Time: 01/23/24  1:01 PM  Result Value Ref Range   Sodium 131 (L) 135 - 145 mmol/L   Potassium 4.2 3.5 - 5.1 mmol/L   Chloride 98 98 - 111 mmol/L   CO2 22 22 - 32 mmol/L   Glucose, Bld 185 (H) 70 - 99 mg/dL   BUN 25 (H) 8 - 23 mg/dL   Creatinine, Ser 8.24 (H) 0.61 - 1.24 mg/dL   Calcium  9.4 8.9 - 10.3 mg/dL   Total Protein 6.7 6.5 - 8.1 g/dL   Albumin 3.7 3.5 - 5.0 g/dL   AST 17 15 - 41 U/L   ALT 13 0 - 44 U/L   Alkaline Phosphatase 71 38 - 126 U/L   Total Bilirubin 1.2 0.0 - 1.2 mg/dL   GFR, Estimated 39 (L) >60 mL/min   Anion gap 12 5 - 15  CBC with Differential     Status: Abnormal   Collection Time: 01/23/24  1:01 PM  Result Value Ref Range   WBC 11.2 (H) 4.0 - 10.5 K/uL   RBC 4.62 4.22 - 5.81 MIL/uL   Hemoglobin 13.4 13.0 - 17.0 g/dL   HCT 59.2 60.9 - 47.9 %   MCV 88.1 80.0 - 100.0 fL   MCH 29.0 26.0 - 34.0 pg   MCHC 32.9 30.0 - 36.0 g/dL   RDW 85.2 88.4 - 84.4 %    Platelets 110 (L) 150 - 400 K/uL   nRBC 0.0 0.0 - 0.2 %   Neutrophils Relative %  92 %   Neutro Abs 10.4 (H) 1.7 - 7.7 K/uL   Lymphocytes Relative 2 %   Lymphs Abs 0.2 (L) 0.7 - 4.0 K/uL   Monocytes Relative 5 %   Monocytes Absolute 0.5 0.1 - 1.0 K/uL   Eosinophils Relative 0 %   Eosinophils Absolute 0.0 0.0 - 0.5 K/uL   Basophils Relative 0 %   Basophils Absolute 0.0 0.0 - 0.1 K/uL   Immature Granulocytes 1 %   Abs Immature Granulocytes 0.09 (H) 0.00 - 0.07 K/uL  I-Stat Lactic Acid     Status: Abnormal   Collection Time: 01/23/24  1:01 PM  Result Value Ref Range   Lactic Acid, Venous 2.1 (HH) 0.5 - 1.9 mmol/L   Comment NOTIFIED PHYSICIAN   I-Stat Lactic Acid     Status: None   Collection Time: 01/23/24  2:49 PM  Result Value Ref Range   Lactic Acid, Venous 0.8 0.5 - 1.9 mmol/L   Renal CT IMPRESSION: 1. Moderate left sided hydroureteronephrosis secondary to a 9 mm stone at the left ureterovesical junction. 2. Right sided bladder base stone. 3. Left perinephric interstitial thickening could simply be secondary to hydronephrosis or superimposed forniceal rupture. 4. Mild prostatomegaly with bladder dome diverticulum, suggesting outlet obstruction. 5. Incidental findings, including: Aortic atherosclerosis (icd10-i70.0). Hepatic steatosis.  Assessment and Plan: Left hydronephrosis due to 9 mm ureteral stone - Urology consulted - N.p.o. after midnight - Pain control with IV narcotics  2.  Acute kidney injury - Gentle IV fluids and monitor creatinine  3.  Hematuria, possible UTI - Rocephin   - Await urine culture  4. Bipolar d/o - Resume  home meds   Home med list still pending.     Advance Care Planning:   Code Status: Prior the patient names his wife as a runner, broadcasting/film/video and wants to be full code  Consults: urology  Family Communication: None  Severity of Illness: The appropriate patient status for this patient is INPATIENT. Inpatient status is judged  to be reasonable and necessary in order to provide the required intensity of service to ensure the patient's safety. The patient's presenting symptoms, physical exam findings, and initial radiographic and laboratory data in the context of their chronic comorbidities is felt to place them at high risk for further clinical deterioration. Furthermore, it is not anticipated that the patient will be medically stable for discharge from the hospital within 2 midnights of admission.   * I certify that at the point of admission it is my clinical judgment that the patient will require inpatient hospital care spanning beyond 2 midnights from the point of admission due to high intensity of service, high risk for further deterioration and high frequency of surveillance required.*  Author: ARTHEA CHILD, MD 01/23/2024 7:36 PM  For on call review [email or web address].

## 2024-01-23 NOTE — Consult Note (Cosign Needed Addendum)
 Urology Consult Note   Requesting Attending Physician:  Garrick Charleston, MD Service Providing Consult: Urology  Consulting Attending: Dr. Sherrilee   Reason for Consult: Ureteral stone with lactic acidosis  HPI: Calvin Santiago is seen in consultation for reasons noted above at the request of Garrick Charleston, MD. Patient is a 78 y.o. male presenting with worsening left flank pain, hematuria, fever, and lactic acidosis.  He denies previous history with nephrolithiasis.  He initially thought his lower back pain was spinal and visited his orthopedist who confirmed that it was not spinal in nature and directed him to the emergency department.  On arrival CT A/P noted 9 mm left UVJ stone, lactic acidosis of 2.1, mild leukocytosis, and patient was afebrile.  On my arrival he was alert, oriented, and resting comfortably in bed after administration of pain medication.  We reviewed the natural history of stone production and available treatment options to him including infographic's.  All questions were answered to their satisfaction. ------------------  Assessment:   78 y.o. male with 9 mm left UVJ stone.   Recommendations: # Left UVJ stone # Lactic acidosis # UTI?  His lactic acidosis resolved in about an hour and a half with fluids, this most likely represents dehydration over the septic picture.  Reports almost no oral intake for 48 hours. Hemodynamically stable and afebrile Trend labs Agree with broad ABX in light of UA Strain all urine N.p.o. at midnight Reevaluate for cystoscopy with basket extraction versus ureteral stent placement tomorrow morning or if he begins fevering/decompensates overnight  Case and plan discussed with patient, wife, Dr. Sherrilee, ED team  Past Medical History: Past Medical History:  Diagnosis Date   Anxiety    Bipolar 2 disorder (HCC)    Prostate cancer [facility])     Past Surgical History:  Past Surgical History:  Procedure Laterality Date   PROSTATE  BIOPSY     RADIOLOGY WITH ANESTHESIA N/A 01/22/2018   Procedure: MRI of the brain WITH ANESTHESIA;  Surgeon: Radiologist, Medication, MD;  Location: [facility] OR;  Service: Radiology;  Laterality: N/A;   REPLACEMENT TOTAL KNEE     bilateral    Medication: Current Facility-Administered Medications  Medication Dose Route Frequency Provider Last Rate Last Admin   sodium chloride  0.9 % bolus 3,606 mL  30 mL/kg Intravenous Once Garrick Charleston, MD       Current Outpatient Medications  Medication Sig Dispense Refill   acetaminophen  (TYLENOL ) 500 MG tablet Take 1,000 mg by mouth every 6 (six) hours as needed for moderate pain.     amantadine  (SYMMETREL ) 100 MG capsule      amLODipine  (NORVASC ) 5 MG tablet Take 5 mg by mouth daily.     divalproex  (DEPAKOTE ) 250 MG DR tablet      folic acid  (FOLVITE ) 1 MG tablet Take 1 tablet (1 mg total) by mouth daily. 90 tablet 4   QUEtiapine  (SEROQUEL ) 100 MG tablet      sertraline  (ZOLOFT ) 100 MG tablet Take 200 mg by mouth daily.      vitamin B-12 (CYANOCOBALAMIN ) 1000 MCG tablet Take 1 tablet (1,000 mcg total) by mouth daily. 30 tablet 3    Allergies: Allergies  Allergen Reactions   Sulfa Antibiotics Other (See Comments)    As a child, he ran into walls    Social History: Social History   Tobacco Use   Smoking status: Never   Smokeless tobacco: Never  Vaping Use   Vaping status: Unknown  Substance Use Topics   Alcohol  use: Never   Drug use: Never    Family History Family History  Problem Relation Age of Onset   Prostate cancer Father    Prostate cancer Brother    Prostate cancer Maternal Grandfather    Breast cancer Neg Hx    Pancreatic cancer Neg Hx    Colon cancer Neg Hx     Review of Systems  Genitourinary:  Positive for hematuria. Negative for dysuria, flank pain, frequency and urgency.     Objective   Vital signs in last 24 hours: BP 135/83   Pulse 73   Temp 97.9 F (36.6 C)   Resp 20   Ht 6' 2 (1.88 m)   Wt 120.2 kg    SpO2 95%   BMI 34.02 kg/m   Physical Exam General: A&O, resting, appropriate HEENT: [place]/AT Pulmonary: Normal work of breathing Cardiovascular: no cyanosis    Most Recent Labs: Lab Results  Component Value Date   WBC 11.2 (H) 01/23/2024   HGB 13.4 01/23/2024   HCT 40.7 01/23/2024   PLT 110 (L) 01/23/2024    Lab Results  Component Value Date   NA 131 (L) 01/23/2024   K 4.2 01/23/2024   CL 98 01/23/2024   CO2 22 01/23/2024   BUN 25 (H) 01/23/2024   CREATININE 1.75 (H) 01/23/2024   CALCIUM  9.4 01/23/2024    No results found for: INR, APTT   Urine Culture: @[id number] (laburin,org,[id number])@   IMAGING: CT Renal Stone Study Result Date: 01/23/2024 EXAM: CT ABDOMEN AND PELVIS WITHOUT CONTRAST 01/23/2024 12:50:09 PM TECHNIQUE: CT of the abdomen and pelvis was performed without the administration of intravenous contrast. Multiplanar reformatted images are provided for review. Automated exposure control, iterative reconstruction, and/or weight-based adjustment of the mA/kV was utilized to reduce the radiation dose to as low as reasonably achievable. COMPARISON: Prostate MRI of 06/13/2017. No prior CT. CLINICAL HISTORY: Abdominal/flank pain, stone suspected. FINDINGS: LOWER CHEST: Normal heart size. Minimal bibasilar scarring. LIVER: Mild hepatic steatosis. Bilateral low density liver lesions, the larger lesions include up to 4.4 cm, are likely cysts. Other smaller lesions are too small to characterize but also most likely cysts. Nonspecific moderate caudate lobe enlargement. No intrahepatic biliary duct dilatation. GALLBLADDER AND BILE DUCTS: Gallbladder is unremarkable. No biliary ductal dilatation. SPLEEN: Normal in size and morphology. PANCREAS: Fatty replacement involving the pancreatic neck, head, uncinate process. No duct dilatation or acute inflammation. ADRENAL GLANDS: No acute abnormality. KIDNEYS, URETERS AND BLADDER: Multiple low density left renal lesions including  up to 5.8 cm are likely cysts. Moderate left sided hydroureteronephrosis to the level of a 9 mm stone at the left ureterovesicular junction on image 86/2. Moderate to marked left perinephric interstitial thickening may represent forniceal rupture. A right sided bladder base stone measuring 4 mm. Diverticulum off the bladder dome including at 8.7 cm. GI AND BOWEL: Normal stomach, without wall thickening. Normal small bowel caliber. Scattered colonic diverticula. Normal appendix. There is no bowel obstruction. PERITONEUM AND RETROPERITONEUM: No ascites. No free air. VASCULATURE: Aorta is normal in caliber. Aortic atherosclerosis. LYMPH NODES: No lymphadenopathy. REPRODUCTIVE ORGANS: Mild prostatomegaly. Radiation seeds within. BONES AND SOFT TISSUES: No acute osseous abnormality. Small fat containing right inguinal hernia. Tiny fat containing periumbilical ventral abdominal wall hernia. No focal soft tissue abnormality. IMPRESSION: 1. Moderate left sided hydroureteronephrosis secondary to a 9 mm stone at the left ureterovesical junction. 2. Right sided bladder base stone. 3. Left perinephric interstitial thickening could simply be secondary to hydronephrosis or superimposed forniceal rupture. 4. Mild prostatomegaly  with bladder dome diverticulum, suggesting outlet obstruction. 5. Incidental findings, including: Aortic atherosclerosis (icd10-i70.0). Hepatic steatosis. Electronically signed by: Rockey Kilts MD 01/23/2024 01:32 PM EST RP Workstation: [id number]    ------  Calvin Bourdon, NP Pager: [phone]   Please contact the urology consult pager with any further questions/concerns.

## 2024-01-23 NOTE — ED Provider Notes (Signed)
  Physical Exam  BP 135/83   Pulse 73   Temp 97.9 F (36.6 C)   Resp 20   Ht 6' 2 (1.88 m)   Wt 120.2 kg   SpO2 95%   BMI 34.02 kg/m   Physical Exam  Procedures  Procedures  ED Course / MDM    Medical Decision Making Amount and/or Complexity of Data Reviewed Labs: ordered. Radiology: ordered.  Risk Prescription drug management. Decision regarding hospitalization.   Pt comes in with cc of back pain and burning with urination. 9 mm stone. Subjective fevers, clinically not septic.  Slightly elevated creatinine.  Focusing on pain control for now.  Urology consulted to see if patient can be discharged.  IMPRESSION:  1. Moderate left sided hydroureteronephrosis secondary to a 9 mm stone at the  left ureterovesical junction.  2. Right sided bladder base stone.  3. Left perinephric interstitial thickening could simply be secondary to  hydronephrosis or superimposed forniceal rupture.  4. Mild prostatomegaly with bladder dome diverticulum, suggesting outlet  obstruction.  5. Incidental findings, including: Aortic atherosclerosis (icd10-i70.0).  Hepatic steatosis.  \  5:43 PM Urology team indicated that patient needs to be admitted and observed overnight.  N.p.o. after midnight.  On repeat assessment, patient states that his pain has improved significantly.  Will request admission.   Charlyn Sora, MD 01/23/24 1743

## 2024-01-23 NOTE — ED Provider Notes (Signed)
 [place] EMERGENCY DEPARTMENT AT [facility] Provider Note   CSN: [id number] Arrival date & time: 01/23/24  1113     Patient presents with: Back Pain   Calvin Santiago is a 78 y.o. male.   HPI Patient with a history of metastatic prostate cancer now presents with worsening back pain, hematuria, fever. He is here with his wife who assists with history.  He notes his pain is 7/10.  Over the past days, possibly weeks he has had dysuria, as well as back pain.  He has been seen and evaluated by orthopedics, had MRI performed yesterday, reportedly has been told the MRI did not demonstrate acute need for orthopedic surgery. Today with fever this morning, aforementioned complaints he presents for evaluation.    Prior to Admission medications   Medication Sig Start Date End Date Taking? Authorizing Provider  acetaminophen  (TYLENOL ) 500 MG tablet Take 1,000 mg by mouth every 6 (six) hours as needed for moderate pain.    [provider]  amantadine  (SYMMETREL ) 100 MG capsule  02/06/18   [provider]  amLODipine  (NORVASC ) 5 MG tablet Take 5 mg by mouth daily. 12/13/17   [provider]  divalproex  (DEPAKOTE ) 250 MG DR tablet  02/06/18   [provider]  folic acid  (FOLVITE ) 1 MG tablet Take 1 tablet (1 mg total) by mouth daily. 02/16/18   Onita Duos, MD  QUEtiapine  (SEROQUEL ) 100 MG tablet  02/06/18   [provider]  sertraline  (ZOLOFT ) 100 MG tablet Take 200 mg by mouth daily.     [provider]  vitamin B-12 (CYANOCOBALAMIN ) 1000 MCG tablet Take 1 tablet (1,000 mcg total) by mouth daily. 01/27/18   Drusilla Sabas RAMAN, MD    Allergies: Sulfa antibiotics    Review of Systems  Updated Vital Signs BP 135/83   Pulse 73   Temp 97.9 F (36.6 C)   Resp 20   Ht 1.88 m (6' 2)   Wt 120.2 kg   SpO2 95%   BMI 34.02 kg/m   Physical Exam Vitals and nursing note reviewed.  Constitutional:      General: He is not in acute  distress.    Appearance: He is well-developed.     Comments: Large adult male awake and alert speaking clearly  HENT:     Head: Normocephalic and atraumatic.  Eyes:     Conjunctiva/sclera: Conjunctivae normal.  Cardiovascular:     Rate and Rhythm: Normal rate and regular rhythm.     Pulses: Normal pulses.  Pulmonary:     Effort: Pulmonary effort is normal. No respiratory distress.     Breath sounds: No stridor.  Abdominal:     General: There is no distension.  Skin:    General: Skin is warm and dry.  Neurological:     Mental Status: He is alert and oriented to person, place, and time.     (all labs ordered are listed, but only abnormal results are displayed) Labs Reviewed  URINALYSIS, ROUTINE W REFLEX MICROSCOPIC - Abnormal; Notable for the following components:      Result Value   Color, Urine AMBER (*)    APPearance TURBID (*)    Hgb urine dipstick MODERATE (*)    Protein, ur 100 (*)    Leukocytes,Ua MODERATE (*)    Bacteria, UA MANY (*)    All other components within normal limits  COMPREHENSIVE METABOLIC PANEL WITH GFR - Abnormal; Notable for the following components:   Sodium 131 (*)  Glucose, Bld 185 (*)    BUN 25 (*)    Creatinine, Ser 1.75 (*)    GFR, Estimated 39 (*)    All other components within normal limits  CBC WITH DIFFERENTIAL/PLATELET - Abnormal; Notable for the following components:   WBC 11.2 (*)    Platelets 110 (*)    Neutro Abs 10.4 (*)    Lymphs Abs 0.2 (*)    Abs Immature Granulocytes 0.09 (*)    All other components within normal limits  I-STAT CG4 LACTIC ACID, ED - Abnormal; Notable for the following components:   Lactic Acid, Venous 2.1 (*)    All other components within normal limits  URINE CULTURE  I-STAT CG4 LACTIC ACID, ED    EKG: None  Radiology: CT Renal Stone Study Result Date: 01/23/2024 EXAM: CT ABDOMEN AND PELVIS WITHOUT CONTRAST 01/23/2024 12:50:09 PM TECHNIQUE: CT of the abdomen and pelvis was performed without the  administration of intravenous contrast. Multiplanar reformatted images are provided for review. Automated exposure control, iterative reconstruction, and/or weight-based adjustment of the mA/kV was utilized to reduce the radiation dose to as low as reasonably achievable. COMPARISON: Prostate MRI of 06/13/2017. No prior CT. CLINICAL HISTORY: Abdominal/flank pain, stone suspected. FINDINGS: LOWER CHEST: Normal heart size. Minimal bibasilar scarring. LIVER: Mild hepatic steatosis. Bilateral low density liver lesions, the larger lesions include up to 4.4 cm, are likely cysts. Other smaller lesions are too small to characterize but also most likely cysts. Nonspecific moderate caudate lobe enlargement. No intrahepatic biliary duct dilatation. GALLBLADDER AND BILE DUCTS: Gallbladder is unremarkable. No biliary ductal dilatation. SPLEEN: Normal in size and morphology. PANCREAS: Fatty replacement involving the pancreatic neck, head, uncinate process. No duct dilatation or acute inflammation. ADRENAL GLANDS: No acute abnormality. KIDNEYS, URETERS AND BLADDER: Multiple low density left renal lesions including up to 5.8 cm are likely cysts. Moderate left sided hydroureteronephrosis to the level of a 9 mm stone at the left ureterovesicular junction on image 86/2. Moderate to marked left perinephric interstitial thickening may represent forniceal rupture. A right sided bladder base stone measuring 4 mm. Diverticulum off the bladder dome including at 8.7 cm. GI AND BOWEL: Normal stomach, without wall thickening. Normal small bowel caliber. Scattered colonic diverticula. Normal appendix. There is no bowel obstruction. PERITONEUM AND RETROPERITONEUM: No ascites. No free air. VASCULATURE: Aorta is normal in caliber. Aortic atherosclerosis. LYMPH NODES: No lymphadenopathy. REPRODUCTIVE ORGANS: Mild prostatomegaly. Radiation seeds within. BONES AND SOFT TISSUES: No acute osseous abnormality. Small fat containing right inguinal hernia.  Tiny fat containing periumbilical ventral abdominal wall hernia. No focal soft tissue abnormality. IMPRESSION: 1. Moderate left sided hydroureteronephrosis secondary to a 9 mm stone at the left ureterovesical junction. 2. Right sided bladder base stone. 3. Left perinephric interstitial thickening could simply be secondary to hydronephrosis or superimposed forniceal rupture. 4. Mild prostatomegaly with bladder dome diverticulum, suggesting outlet obstruction. 5. Incidental findings, including: Aortic atherosclerosis (icd10-i70.0). Hepatic steatosis. Electronically signed by: Rockey Kilts MD 01/23/2024 01:32 PM EST RP Workstation: [id number]     Procedures   Medications Ordered in the ED  sodium chloride  0.9 % bolus 3,606 mL (has no administration in time range)  sodium chloride  0.9 % bolus 1,000 mL (0 mLs Intravenous Stopped 01/23/24 1500)  ondansetron  (ZOFRAN ) injection 4 mg (4 mg Intravenous Given 01/23/24 1259)  morphine  (PF) 4 MG/ML injection 4 mg (4 mg Intravenous Given 01/23/24 1259)  cefTRIAXone  (ROCEPHIN ) 1 g in sodium chloride  0.9 % 100 mL IVPB (0 g Intravenous Stopped 01/23/24 1328)  Medical Decision Making Adult male with a history of metastatic prostate cancer, prior encephalopathy, now presents with back pain, hematuria, dysuria, fever. Broad differential including infected kidney stone, pyelonephritis, cystitis, necrosis secondary to return of disease.  Amount and/or Complexity of Data Reviewed Independent Historian: spouse External Data Reviewed: notes. Labs: ordered. Decision-making details documented in ED Course. Radiology: ordered and independent interpretation performed. Decision-making details documented in ED Course.  Risk Prescription drug management. Decision regarding hospitalization. Diagnosis or treatment significantly limited by social determinants of health.   3:30 PM Patient markedly improved with narcotics.  Initial mild  lactic acidosis improved with fluid resuscitation, initial concerns for dehydration secondary to his nausea, vomiting, or substantiated with Actiq  acidosis, elevated creatinine, BUN. Patient does have mild leukocytosis, and with demonstration of 9 mm UVJ stone I discussed this case with our urology colleagues.  Patient has received antibiotics given his abnormal urinalysis, lactic acidosis, is awaiting urology consult for consideration of intervention now versus close outpatient follow-up.  Final diagnoses:  Lactic acidosis  Kidney stone  Lower urinary tract infectious disease       Calvin Charleston, MD 01/23/24 1531

## 2024-01-23 NOTE — ED Notes (Signed)
Dr. Alvis Lemmings at bedside

## 2024-01-23 NOTE — ED Notes (Signed)
 Urology instructed for pt to remain NPO until decision on surgery is made.

## 2024-01-23 NOTE — ED Notes (Signed)
 NP stated that pt can now eat and drink and should have all urine strained.

## 2024-01-23 NOTE — Plan of Care (Signed)
  Problem: Clinical Measurements: Goal: Respiratory complications will improve Outcome: Progressing Goal: Cardiovascular complication will be avoided Outcome: Progressing   Problem: Activity: Goal: Risk for activity intolerance will decrease Outcome: Progressing   Problem: Coping: Goal: Level of anxiety will decrease Outcome: Progressing   Problem: Elimination: Goal: Will not experience complications related to urinary retention Outcome: Progressing   Problem: Pain Managment: Goal: General experience of comfort will improve and/or be controlled Outcome: Progressing   Problem: Safety: Goal: Ability to remain free from injury will improve Outcome: Progressing   Problem: Skin Integrity: Goal: Risk for impaired skin integrity will decrease Outcome: Progressing

## 2024-01-24 ENCOUNTER — Encounter (HOSPITAL_COMMUNITY): Payer: Self-pay | Admitting: Internal Medicine

## 2024-01-24 DIAGNOSIS — N132 Hydronephrosis with renal and ureteral calculous obstruction: Secondary | ICD-10-CM | POA: Diagnosis not present

## 2024-01-24 LAB — CBC
HCT: 40.4 % (ref 39.0–52.0)
Hemoglobin: 13.3 g/dL (ref 13.0–17.0)
MCH: 28.6 pg (ref 26.0–34.0)
MCHC: 32.9 g/dL (ref 30.0–36.0)
MCV: 86.9 fL (ref 80.0–100.0)
Platelets: 200 K/uL (ref 150–400)
RBC: 4.65 MIL/uL (ref 4.22–5.81)
RDW: 13.5 % (ref 11.5–15.5)
WBC: 4.8 K/uL (ref 4.0–10.5)
nRBC: 0 % (ref 0.0–0.2)

## 2024-01-24 LAB — BASIC METABOLIC PANEL WITH GFR
Anion gap: 10 (ref 5–15)
BUN: <5 mg/dL — ABNORMAL LOW (ref 8–23)
CO2: 31 mmol/L (ref 22–32)
Calcium: 9.8 mg/dL (ref 8.9–10.3)
Chloride: 99 mmol/L (ref 98–111)
Creatinine, Ser: 0.39 mg/dL — ABNORMAL LOW (ref 0.61–1.24)
GFR, Estimated: >60 mL/min (ref >60)
Glucose, Bld: 94 mg/dL (ref 70–99)
Potassium: 3.4 mmol/L — ABNORMAL LOW (ref 3.5–5.1)
Sodium: 141 mmol/L (ref 135–145)

## 2024-01-24 MED ORDER — SERTRALINE HCL 100 MG PO TABS
100.0000 mg | ORAL_TABLET | Freq: Two times a day (BID) | ORAL | Status: DC
  Administered 2024-01-24 – 2024-01-26 (×4): 100 mg via ORAL
  Filled 2024-01-24 (×5): qty 1

## 2024-01-24 MED ORDER — DIVALPROEX SODIUM 250 MG PO DR TAB
250.0000 mg | DELAYED_RELEASE_TABLET | Freq: Two times a day (BID) | ORAL | Status: DC
  Administered 2024-01-24 – 2024-01-26 (×4): 250 mg via ORAL
  Filled 2024-01-24 (×5): qty 1

## 2024-01-24 MED ORDER — AMANTADINE HCL 100 MG PO CAPS
100.0000 mg | ORAL_CAPSULE | Freq: Three times a day (TID) | ORAL | Status: DC
  Administered 2024-01-24 – 2024-01-26 (×6): 100 mg via ORAL
  Filled 2024-01-24 (×8): qty 1

## 2024-01-24 MED ORDER — QUETIAPINE FUMARATE 100 MG PO TABS
100.0000 mg | ORAL_TABLET | Freq: Every day | ORAL | Status: DC
  Administered 2024-01-24 – 2024-01-25 (×2): 100 mg via ORAL
  Filled 2024-01-24 (×2): qty 1

## 2024-01-24 MED ORDER — TAMSULOSIN HCL 0.4 MG PO CAPS
0.4000 mg | ORAL_CAPSULE | Freq: Every day | ORAL | Status: DC
  Administered 2024-01-24 – 2024-01-26 (×2): 0.4 mg via ORAL
  Filled 2024-01-24 (×3): qty 1

## 2024-01-24 NOTE — Consult Note (Cosign Needed Addendum)
 Urology Consult Note   Requesting Attending Physician:  Rosario Leatrice FERNS, MD Service Providing Consult: Urology  Consulting Attending: Dr. Sherrilee   Reason for Consult: Ureteral stone with lactic acidosis  HPI: Calvin Santiago is seen in consultation for reasons noted above at the request of Rosario Leatrice I, MD. Patient is a 78 y.o. male presenting with worsening left flank pain, hematuria, fever, and lactic acidosis.  He denies previous history with nephrolithiasis.  He initially thought his lower back pain was spinal and visited his orthopedist who confirmed that it was not spinal in nature and directed him to the emergency department.  On arrival CT A/P noted 9 mm left UVJ stone, lactic acidosis of 2.1, mild leukocytosis, and patient was afebrile.  On my arrival he was alert, oriented, and resting comfortably in bed after administration of pain medication.  We reviewed the natural history of stone production and available treatment options to him including infographic's.  All questions were answered to their satisfaction.  Interval 11/22: AFVSS overnight. Has not passed stone and remains with flank pain. Labs wnl. Patient would like to try additional day of MET and proceed to ureteroscopy tomorrow if he does not pass the stone.  ------------------  Assessment:   78 y.o. male with symptomatic 9 mm left UVJ stone.   Recommendations: # Left UVJ stone # Lactic acidosis # UTI?  Please start Flomax  0.4 mg daily  Agree with broad ABX in light of UA Strain all urine Ok for regular diet today N.p.o. at midnight Reevaluate for cystoscopy with basket extraction versus ureteral stent placement tomorrow morning or if he begins fevering/decompensates overnight  Case and plan discussed with patient, wife, Dr. Sherrilee.  Past Medical History: Past Medical History:  Diagnosis Date   Anxiety    Bipolar 2 disorder (HCC)    Depression    Prostate cancer [facility])     Past Surgical  History:  Past Surgical History:  Procedure Laterality Date   PROSTATE BIOPSY     RADIOLOGY WITH ANESTHESIA N/A 01/22/2018   Procedure: MRI of the brain WITH ANESTHESIA;  Surgeon: Radiologist, Medication, MD;  Location: [facility] OR;  Service: Radiology;  Laterality: N/A;   REPLACEMENT TOTAL KNEE     bilateral    Medication: Current Facility-Administered Medications  Medication Dose Route Frequency Provider Last Rate Last Admin   acetaminophen  (TYLENOL ) tablet 650 mg  650 mg Oral Q6H PRN Arthea Child, MD       Or   acetaminophen  (TYLENOL ) suppository 650 mg  650 mg Rectal Q6H PRN Arthea Child, MD       amantadine  (SYMMETREL ) capsule 100 mg  100 mg Oral TID Claiborne, Claudia, MD       bisacodyl  (DULCOLAX) EC tablet 5 mg  5 mg Oral Daily PRN Arthea Child, MD       cefTRIAXone  (ROCEPHIN ) 1 g in sodium chloride  0.9 % 100 mL IVPB  1 g Intravenous Q24H Claiborne, Claudia, MD       divalproex  (DEPAKOTE ) DR tablet 250 mg  250 mg Oral Q12H Claiborne, Claudia, MD       HYDROmorphone  (DILAUDID ) injection 0.5 mg  0.5 mg Intravenous Q3H PRN Arthea Child, MD   0.5 mg at 01/24/24 9146   ondansetron  (ZOFRAN ) tablet 4 mg  4 mg Oral Q6H PRN Arthea Child, MD       Or   ondansetron  (ZOFRAN ) injection 4 mg  4 mg Intravenous Q6H PRN Arthea Child, MD       QUEtiapine  (SEROQUEL )  tablet 100 mg  100 mg Oral QHS Claiborne, Claudia, MD       sertraline  (ZOLOFT ) tablet 100 mg  100 mg Oral BID Claiborne, Claudia, MD       sodium chloride  flush (NS) 0.9 % injection 3 mL  3 mL Intravenous Q12H Arthea Child, MD   3 mL at 01/23/24 2107    Allergies: Allergies  Allergen Reactions   Sulfa Antibiotics Other (See Comments)    As a child, he ran into walls and would bang his head against the wall    Social History: Social History   Tobacco Use   Smoking status: Never   Smokeless tobacco: Never  Vaping Use   Vaping status: Unknown  Substance Use Topics   Alcohol use: Never    Drug use: Never    Family History Family History  Problem Relation Age of Onset   Prostate cancer Father    Prostate cancer Brother    Prostate cancer Maternal Grandfather    Breast cancer Neg Hx    Pancreatic cancer Neg Hx    Colon cancer Neg Hx     Review of Systems  Genitourinary:  Positive for hematuria. Negative for dysuria, flank pain, frequency and urgency.     Objective   Vital signs in last 24 hours: BP (!) 148/95 (BP Location: Right Arm)   Pulse 96   Temp 99.8 F (37.7 C)   Resp 16   Ht 6' 2 (1.88 m)   Wt 120.2 kg   SpO2 95%   BMI 34.02 kg/m   Physical Exam General: A&O, resting, appropriate HEENT: [place]/AT Pulmonary: Normal work of breathing Cardiovascular: no cyanosis    Most Recent Labs: Lab Results  Component Value Date   WBC 4.8 01/24/2024   HGB 13.3 01/24/2024   HCT 40.4 01/24/2024   PLT 200 01/24/2024    Lab Results  Component Value Date   NA 141 01/24/2024   K 3.4 (L) 01/24/2024   CL 99 01/24/2024   CO2 31 01/24/2024   BUN <5 (L) 01/24/2024   CREATININE 0.39 (L) 01/24/2024   CALCIUM  9.8 01/24/2024    No results found for: INR, APTT   Urine Culture: @[id number] (laburin,org,[id number])@   IMAGING: CT Renal Stone Study Result Date: 01/23/2024 EXAM: CT ABDOMEN AND PELVIS WITHOUT CONTRAST 01/23/2024 12:50:09 PM TECHNIQUE: CT of the abdomen and pelvis was performed without the administration of intravenous contrast. Multiplanar reformatted images are provided for review. Automated exposure control, iterative reconstruction, and/or weight-based adjustment of the mA/kV was utilized to reduce the radiation dose to as low as reasonably achievable. COMPARISON: Prostate MRI of 06/13/2017. No prior CT. CLINICAL HISTORY: Abdominal/flank pain, stone suspected. FINDINGS: LOWER CHEST: Normal heart size. Minimal bibasilar scarring. LIVER: Mild hepatic steatosis. Bilateral low density liver lesions, the larger lesions include up to 4.4 cm, are  likely cysts. Other smaller lesions are too small to characterize but also most likely cysts. Nonspecific moderate caudate lobe enlargement. No intrahepatic biliary duct dilatation. GALLBLADDER AND BILE DUCTS: Gallbladder is unremarkable. No biliary ductal dilatation. SPLEEN: Normal in size and morphology. PANCREAS: Fatty replacement involving the pancreatic neck, head, uncinate process. No duct dilatation or acute inflammation. ADRENAL GLANDS: No acute abnormality. KIDNEYS, URETERS AND BLADDER: Multiple low density left renal lesions including up to 5.8 cm are likely cysts. Moderate left sided hydroureteronephrosis to the level of a 9 mm stone at the left ureterovesicular junction on image 86/2. Moderate to marked left perinephric interstitial thickening may represent forniceal rupture. A right sided  bladder base stone measuring 4 mm. Diverticulum off the bladder dome including at 8.7 cm. GI AND BOWEL: Normal stomach, without wall thickening. Normal small bowel caliber. Scattered colonic diverticula. Normal appendix. There is no bowel obstruction. PERITONEUM AND RETROPERITONEUM: No ascites. No free air. VASCULATURE: Aorta is normal in caliber. Aortic atherosclerosis. LYMPH NODES: No lymphadenopathy. REPRODUCTIVE ORGANS: Mild prostatomegaly. Radiation seeds within. BONES AND SOFT TISSUES: No acute osseous abnormality. Small fat containing right inguinal hernia. Tiny fat containing periumbilical ventral abdominal wall hernia. No focal soft tissue abnormality. IMPRESSION: 1. Moderate left sided hydroureteronephrosis secondary to a 9 mm stone at the left ureterovesical junction. 2. Right sided bladder base stone. 3. Left perinephric interstitial thickening could simply be secondary to hydronephrosis or superimposed forniceal rupture. 4. Mild prostatomegaly with bladder dome diverticulum, suggesting outlet obstruction. 5. Incidental findings, including: Aortic atherosclerosis (icd10-i70.0). Hepatic steatosis.  Electronically signed by: Rockey Kilts MD 01/23/2024 01:32 PM EST RP Workstation: [id number]    ------

## 2024-01-24 NOTE — Plan of Care (Signed)
   Problem: Education: Goal: Knowledge of General Education information will improve Description Including pain rating scale, medication(s)/side effects and non-pharmacologic comfort measures Outcome: Progressing   Problem: Health Behavior/Discharge Planning: Goal: Ability to manage health-related needs will improve Outcome: Progressing

## 2024-01-24 NOTE — Plan of Care (Signed)

## 2024-01-24 NOTE — Progress Notes (Signed)
 PROGRESS NOTE    Calvin Santiago  FMW:[id number] DOB: 12-Aug-1945 DOA: 01/23/2024 PCP: Kip Righter, MD  Outpatient Specialists:     Brief Narrative:  As per H&P done on admission: Calvin Santiago is a 78 y.o. male with medical history significant for bipolar disorder and prostate cancer 2 years ago.  The patient has been struggling with left-sided back pain for the last 10 days.  He has seen an orthopedic doctor.  He has been taking pain medication.  He has plans to start physical therapy.  He has also been having trouble with burning with urination and feeling like not a lot of urine is coming out when he does go. Earlier in the week he had a day or so where he could not urinate at all where nothing would come out.  That resolved but he still urinating very small amounts.  This morning at 5 AM he started sweating profusely.  He had a temperature of 102.  His wife called the orthopedic doctor who recommended that he come to the emergency department. In the ER his workup included a renal CT and he was found to have left-sided hydronephrosis with a 9 mm ureter stone.  He did not have a fever or elevated white blood cell count in the ED but urology has been called and the patient will be admitted.    01/24/2024: Patient seen.  No new complaints.  Urology input is appreciated.  Urology team has advised starting Flomax  0.4 mg p.o. once daily straining all urine.  Urology also advised n.p.o. after midnight  (Reevaluate for cystoscopy with basket extraction versus ureteral stent placement tomorrow morning or if he begins fevering/decompensates overnight .   Assessment & Plan:   Principal Problem:   Hydronephrosis concurrent with and due to calculi of kidney and ureter Active Problems:   Major depressive disorder, recurrent episode, severe (HCC)   Seizure (HCC)   Dementia without behavioral disturbance (HCC)   Vitamin B12 deficiency   Left hydronephrosis: -9 mm ureteral stone - Urology input is  appreciated. - N.p.o. after midnight - Pain control with IV narcotics   Acute kidney injury: -Scr of 1.75 on presentation (Baseline Scr 1.24) -Scr improved to 0.39 today -UA revealed SG of 1.019 -AKI is likely multifactorial -Urine culture is growing enterococcus faecalis -Gentle IV fluids and monitor creatinine -Treat UTI -Nephrolithiasis with hydronephrosis -Monitor renal function and electrolyes -Keep MAP greater than 65 mmHg   UTI with Hematuria: -Urine culture is growing Enterococcus Faecalis -Follow final culture result    Bipolar disorder: - Resume  home meds   DVT prophylaxis:  Code Status: Full code Family Communication:  Disposition Plan: Inpatient   Consultants:  Urology  Procedures:  Now for now  Antimicrobials:  IV Rocephin .   Subjective: No new complaints.  Objective: Vitals:   01/23/24 2033 01/24/24 0024 01/24/24 0423 01/24/24 0815  BP: (!) 119/92 (!) 147/95 (!) 145/94 (!) 148/95  Pulse: 89 81 87 96  Resp: 18 18 18 16   Temp: 98.7 F (37.1 C) 99.7 F (37.6 C) 98.7 F (37.1 C) 99.8 F (37.7 C)  TempSrc: Oral     SpO2: 100% 93% 92% 95%  Weight:      Height:        Intake/Output Summary (Last 24 hours) at 01/24/2024 1145 Last data filed at 01/24/2024 1018 Gross per 24 hour  Intake 1653 ml  Output 600 ml  Net 1053 ml   Filed Weights   01/23/24 1247  Weight: 120.2 kg  Examination:  General exam: Appears calm and comfortable.  Patient is obese. Respiratory system: Clear to auscultation. Respiratory effort normal. Cardiovascular system: S1 & S2 heard Gastrointestinal system: Abdomen is obese, soft and nontender.   Central nervous system: Alert and oriented.  Extremities: Mild ankle edema.  Data Reviewed: I have personally reviewed following labs and imaging studies  CBC: Recent Labs  Lab 01/23/24 1301 01/24/24 0454  WBC 11.2* 4.8  NEUTROABS 10.4*  --   HGB 13.4 13.3  HCT 40.7 40.4  MCV 88.1 86.9  PLT 110* 200    Basic Metabolic Panel: Recent Labs  Lab 01/23/24 1301 01/24/24 0454  NA 131* 141  K 4.2 3.4*  CL 98 99  CO2 22 31  GLUCOSE 185* 94  BUN 25* <5*  CREATININE 1.75* 0.39*  CALCIUM  9.4 9.8   GFR: Estimated Creatinine Clearance: 103.1 mL/min (A) (by C-G formula based on SCr of 0.39 mg/dL (L)). Liver Function Tests: Recent Labs  Lab 01/23/24 1301  AST 17  ALT 13  ALKPHOS 71  BILITOT 1.2  PROT 6.7  ALBUMIN 3.7   No results for input(s): LIPASE, AMYLASE in the last 168 hours. No results for input(s): AMMONIA in the last 168 hours. Coagulation Profile: No results for input(s): INR, PROTIME in the last 168 hours. Cardiac Enzymes: No results for input(s): CKTOTAL, CKMB, CKMBINDEX, TROPONINI in the last 168 hours. BNP (last 3 results) No results for input(s): PROBNP in the last 8760 hours. HbA1C: No results for input(s): HGBA1C in the last 72 hours. CBG: No results for input(s): GLUCAP in the last 168 hours. Lipid Profile: No results for input(s): CHOL, HDL, LDLCALC, TRIG, CHOLHDL, LDLDIRECT in the last 72 hours. Thyroid  Function Tests: No results for input(s): TSH, T4TOTAL, FREET4, T3FREE, THYROIDAB in the last 72 hours. Anemia Panel: No results for input(s): VITAMINB12, FOLATE, FERRITIN, TIBC, IRON, RETICCTPCT in the last 72 hours. Urine analysis:    Component Value Date/Time   COLORURINE AMBER (A) 01/23/2024 1140   APPEARANCEUR TURBID (A) 01/23/2024 1140   LABSPEC 1.019 01/23/2024 1140   PHURINE 6.0 01/23/2024 1140   GLUCOSEU NEGATIVE 01/23/2024 1140   HGBUR MODERATE (A) 01/23/2024 1140   BILIRUBINUR NEGATIVE 01/23/2024 1140   KETONESUR NEGATIVE 01/23/2024 1140   PROTEINUR 100 (A) 01/23/2024 1140   NITRITE NEGATIVE 01/23/2024 1140   LEUKOCYTESUR MODERATE (A) 01/23/2024 1140   Sepsis Labs: @LABRCNTIP (procalcitonin:4,lacticidven:4)  )No results found for this or any previous visit (from the past 240  hours).       Radiology Studies: CT Renal Stone Study Result Date: 01/23/2024 EXAM: CT ABDOMEN AND PELVIS WITHOUT CONTRAST 01/23/2024 12:50:09 PM TECHNIQUE: CT of the abdomen and pelvis was performed without the administration of intravenous contrast. Multiplanar reformatted images are provided for review. Automated exposure control, iterative reconstruction, and/or weight-based adjustment of the mA/kV was utilized to reduce the radiation dose to as low as reasonably achievable. COMPARISON: Prostate MRI of 06/13/2017. No prior CT. CLINICAL HISTORY: Abdominal/flank pain, stone suspected. FINDINGS: LOWER CHEST: Normal heart size. Minimal bibasilar scarring. LIVER: Mild hepatic steatosis. Bilateral low density liver lesions, the larger lesions include up to 4.4 cm, are likely cysts. Other smaller lesions are too small to characterize but also most likely cysts. Nonspecific moderate caudate lobe enlargement. No intrahepatic biliary duct dilatation. GALLBLADDER AND BILE DUCTS: Gallbladder is unremarkable. No biliary ductal dilatation. SPLEEN: Normal in size and morphology. PANCREAS: Fatty replacement involving the pancreatic neck, head, uncinate process. No duct dilatation or acute inflammation. ADRENAL GLANDS: No acute abnormality.  KIDNEYS, URETERS AND BLADDER: Multiple low density left renal lesions including up to 5.8 cm are likely cysts. Moderate left sided hydroureteronephrosis to the level of a 9 mm stone at the left ureterovesicular junction on image 86/2. Moderate to marked left perinephric interstitial thickening may represent forniceal rupture. A right sided bladder base stone measuring 4 mm. Diverticulum off the bladder dome including at 8.7 cm. GI AND BOWEL: Normal stomach, without wall thickening. Normal small bowel caliber. Scattered colonic diverticula. Normal appendix. There is no bowel obstruction. PERITONEUM AND RETROPERITONEUM: No ascites. No free air. VASCULATURE: Aorta is normal in caliber.  Aortic atherosclerosis. LYMPH NODES: No lymphadenopathy. REPRODUCTIVE ORGANS: Mild prostatomegaly. Radiation seeds within. BONES AND SOFT TISSUES: No acute osseous abnormality. Small fat containing right inguinal hernia. Tiny fat containing periumbilical ventral abdominal wall hernia. No focal soft tissue abnormality. IMPRESSION: 1. Moderate left sided hydroureteronephrosis secondary to a 9 mm stone at the left ureterovesical junction. 2. Right sided bladder base stone. 3. Left perinephric interstitial thickening could simply be secondary to hydronephrosis or superimposed forniceal rupture. 4. Mild prostatomegaly with bladder dome diverticulum, suggesting outlet obstruction. 5. Incidental findings, including: Aortic atherosclerosis (icd10-i70.0). Hepatic steatosis. Electronically signed by: Rockey Kilts MD 01/23/2024 01:32 PM EST RP Workstation: [id number]        Scheduled Meds:  amantadine   100 mg Oral TID   divalproex   250 mg Oral Q12H   QUEtiapine   100 mg Oral QHS   sertraline   100 mg Oral BID   sodium chloride  flush  3 mL Intravenous Q12H   Continuous Infusions:  cefTRIAXone  (ROCEPHIN )  IV       LOS: 1 day    Time spent: 55 minutes.    Leatrice Chapel, MD  [facility] Pager #: [phone] 7PM-7AM contact night coverage as above

## 2024-01-25 ENCOUNTER — Inpatient Hospital Stay (HOSPITAL_COMMUNITY): Admitting: Certified Registered"

## 2024-01-25 ENCOUNTER — Encounter (HOSPITAL_COMMUNITY)
Admission: EM | Disposition: A | Discharge status: Home / Self Care | Payer: Self-pay | Source: Ambulatory Visit | Attending: Internal Medicine

## 2024-01-25 ENCOUNTER — Inpatient Hospital Stay (HOSPITAL_COMMUNITY)

## 2024-01-25 DIAGNOSIS — N200 Calculus of kidney: Secondary | ICD-10-CM

## 2024-01-25 DIAGNOSIS — N201 Calculus of ureter: Secondary | ICD-10-CM | POA: Diagnosis not present

## 2024-01-25 DIAGNOSIS — F418 Other specified anxiety disorders: Secondary | ICD-10-CM

## 2024-01-25 DIAGNOSIS — N132 Hydronephrosis with renal and ureteral calculous obstruction: Secondary | ICD-10-CM | POA: Diagnosis not present

## 2024-01-25 HISTORY — PX: CYSTOSCOPY/URETEROSCOPY/HOLMIUM LASER/STENT PLACEMENT: SHX6546

## 2024-01-25 LAB — RENAL FUNCTION PANEL
Albumin: 2.9 g/dL — ABNORMAL LOW (ref 3.5–5.0)
Anion gap: 8 (ref 5–15)
BUN: 27 mg/dL — ABNORMAL HIGH (ref 8–23)
CO2: 24 mmol/L (ref 22–32)
Calcium: 8.6 mg/dL — ABNORMAL LOW (ref 8.9–10.3)
Chloride: 99 mmol/L (ref 98–111)
Creatinine, Ser: 1.28 mg/dL — ABNORMAL HIGH (ref 0.61–1.24)
GFR, Estimated: 57 mL/min — ABNORMAL LOW (ref >60)
Glucose, Bld: 129 mg/dL — ABNORMAL HIGH (ref 70–99)
Phosphorus: 3.1 mg/dL (ref 2.5–4.6)
Potassium: 4.6 mmol/L (ref 3.5–5.1)
Sodium: 131 mmol/L — ABNORMAL LOW (ref 135–145)

## 2024-01-25 LAB — URINE CULTURE: Culture: >=100000 — AB

## 2024-01-25 SURGERY — CYSTOSCOPY/URETEROSCOPY/HOLMIUM LASER/STENT PLACEMENT
Anesthesia: General | Site: Ureter | Laterality: Left

## 2024-01-25 MED ORDER — DEXAMETHASONE SOD PHOSPHATE PF 10 MG/ML IJ SOLN
INTRAMUSCULAR | Status: DC | PRN
  Administered 2024-01-25: 4 mg via INTRAVENOUS

## 2024-01-25 MED ORDER — PROPOFOL 10 MG/ML IV BOLUS
INTRAVENOUS | Status: DC | PRN
  Administered 2024-01-25: 50 mg via INTRAVENOUS
  Administered 2024-01-25: 150 mg via INTRAVENOUS

## 2024-01-25 MED ORDER — AMOXICILLIN 500 MG PO CAPS
500.0000 mg | ORAL_CAPSULE | Freq: Three times a day (TID) | ORAL | Status: DC
  Administered 2024-01-25 – 2024-01-26 (×4): 500 mg via ORAL
  Filled 2024-01-25 (×6): qty 1

## 2024-01-25 MED ORDER — FENTANYL CITRATE (PF) 100 MCG/2ML IJ SOLN
INTRAMUSCULAR | Status: AC
  Filled 2024-01-25: qty 2

## 2024-01-25 MED ORDER — ONDANSETRON HCL 4 MG/2ML IJ SOLN
INTRAMUSCULAR | Status: DC | PRN
  Administered 2024-01-25: 4 mg via INTRAVENOUS

## 2024-01-25 MED ORDER — LACTATED RINGERS IV SOLN: INTRAVENOUS | Status: DC | PRN

## 2024-01-25 MED ORDER — ACETAMINOPHEN 10 MG/ML IV SOLN: 1000.0000 mg | Freq: Once | INTRAVENOUS | Status: DC | PRN

## 2024-01-25 MED ORDER — DROPERIDOL 2.5 MG/ML IJ SOLN: 0.6250 mg | Freq: Once | INTRAMUSCULAR | Status: DC | PRN

## 2024-01-25 MED ORDER — LIDOCAINE HCL (PF) 2 % IJ SOLN
INTRAMUSCULAR | Status: AC
  Filled 2024-01-25: qty 5

## 2024-01-25 MED ORDER — FENTANYL CITRATE (PF) 50 MCG/ML IJ SOSY: 25.0000 ug | PREFILLED_SYRINGE | INTRAMUSCULAR | Status: DC | PRN

## 2024-01-25 MED ORDER — LIDOCAINE HCL (CARDIAC) PF 100 MG/5ML IV SOSY
PREFILLED_SYRINGE | INTRAVENOUS | Status: DC | PRN
  Administered 2024-01-25: 50 mg via INTRAVENOUS

## 2024-01-25 MED ORDER — ONDANSETRON HCL 4 MG/2ML IJ SOLN
INTRAMUSCULAR | Status: AC
Start: 2024-01-25 — End: 2024-01-25
  Filled 2024-01-25: qty 2

## 2024-01-25 MED ORDER — SUCCINYLCHOLINE CHLORIDE 200 MG/10ML IV SOSY
PREFILLED_SYRINGE | INTRAVENOUS | Status: AC
  Filled 2024-01-25: qty 10

## 2024-01-25 MED ORDER — FENTANYL CITRATE (PF) 250 MCG/5ML IJ SOLN
INTRAMUSCULAR | Status: DC | PRN
  Administered 2024-01-25 (×4): 50 ug via INTRAVENOUS

## 2024-01-25 MED ORDER — ROCURONIUM BROMIDE 10 MG/ML (PF) SYRINGE
PREFILLED_SYRINGE | INTRAVENOUS | Status: AC
  Filled 2024-01-25: qty 10

## 2024-01-25 MED ORDER — SODIUM CHLORIDE 0.9 % IR SOLN
Status: DC | PRN
  Administered 2024-01-25 (×2): 3000 mL via INTRAVESICAL

## 2024-01-25 MED ORDER — PROPOFOL 10 MG/ML IV BOLUS
INTRAVENOUS | Status: AC
  Filled 2024-01-25: qty 20

## 2024-01-25 SURGICAL SUPPLY — 15 items
BAG URO CATCHER STRL LF (MISCELLANEOUS) ×2 IMPLANT
CATH URETL OPEN END 6FR 70 (CATHETERS) ×2 IMPLANT
CLOTH BEACON ORANGE TIMEOUT ST (SAFETY) ×2 IMPLANT
EXTRACTOR STONE 1.7FRX115CM (UROLOGICAL SUPPLIES) IMPLANT
GLOVE SURG LX STRL 8.0 MICRO (GLOVE) ×2 IMPLANT
GOWN STRL REUS W/ TWL XL LVL3 (GOWN DISPOSABLE) ×4 IMPLANT
GUIDEWIRE STRT TIP .038X150X3 (WIRE) IMPLANT
GUIDEWIRE ZIPWRE .038 STRAIGHT (WIRE) ×2 IMPLANT
MANIFOLD NEPTUNE II (INSTRUMENTS) ×2 IMPLANT
PACK CYSTO (CUSTOM PROCEDURE TRAY) ×2 IMPLANT
STENT URET 6FRX26 CONTOUR (STENTS) IMPLANT
TRACTIP FLEXIVA PULSE ID 200 (Laser) IMPLANT
TUBE FEEDING LG 5FR L90CM PUR (TUBING) IMPLANT
TUBE PU 8FR 16IN ENFIT (TUBING) IMPLANT
TUBING CONNECTING 10 (TUBING) ×2 IMPLANT

## 2024-01-25 NOTE — Anesthesia Procedure Notes (Signed)
 Procedure Name: LMA Insertion Date/Time: 01/25/2024 7:42 AM  Performed by: Pheobe Adine CROME, CRNAPre-anesthesia Checklist: Patient identified, Emergency Drugs available, Suction available, Patient being monitored and Timeout performed Patient Re-evaluated:Patient Re-evaluated prior to induction Oxygen Delivery Method: Circle system utilized Preoxygenation: Pre-oxygenation with 100% oxygen Induction Type: IV induction LMA: LMA inserted Number of attempts: 1 Placement Confirmation: positive ETCO2, CO2 detector and breath sounds checked- equal and bilateral Tube secured with: Tape Dental Injury: Teeth and Oropharynx as per pre-operative assessment

## 2024-01-25 NOTE — Anesthesia Postprocedure Evaluation (Signed)
 Anesthesia Post Note  Patient: Calvin Santiago  Procedure(s) Performed: CYSTOSCOPY/URETEROSCOPY/HOLMIUM LASER/STENT PLACEMENT (Left: Ureter)     Patient location during evaluation: PACU Anesthesia Type: General Level of consciousness: awake and alert Pain management: pain level controlled Vital Signs Assessment: post-procedure vital signs reviewed and stable Respiratory status: spontaneous breathing, nonlabored ventilation, respiratory function stable and patient connected to nasal cannula oxygen Cardiovascular status: blood pressure returned to baseline and stable Postop Assessment: no apparent nausea or vomiting Anesthetic complications: no   No notable events documented.  Last Vitals:  Vitals:   01/25/24 1030 01/25/24 1150  BP: 126/75 (!) 143/96  Pulse: 79 89  Resp: 16 18  Temp: 36.8 C 36.8 C  SpO2: 96% 92%    Last Pain:  Vitals:   01/25/24 1150  TempSrc: Oral  PainSc:                  Cordella SQUIBB Deette Revak

## 2024-01-25 NOTE — Plan of Care (Signed)

## 2024-01-25 NOTE — Anesthesia Preprocedure Evaluation (Addendum)
 Anesthesia Evaluation  Patient identified by MRN, date of birth, ID band Patient awake    Reviewed: Allergy & Precautions, NPO status , Patient's Chart, lab work & pertinent test results  Airway Mallampati: II  TM Distance: >3 FB Neck ROM: Full    Dental no notable dental hx.    Pulmonary neg pulmonary ROS   Pulmonary exam normal        Cardiovascular negative cardio ROS  Rhythm:Regular Rate:Normal     Neuro/Psych Seizures -,   Anxiety Depression Bipolar Disorder  Dementia    GI/Hepatic negative GI ROS, Neg liver ROS,,,  Endo/Other  negative endocrine ROS    Renal/GU Renal disease  negative genitourinary   Musculoskeletal negative musculoskeletal ROS (+)    Abdominal Normal abdominal exam  (+)   Peds  Hematology Lab Results      Component                Value               Date                      WBC                      4.8                 01/24/2024                HGB                      13.3                01/24/2024                HCT                      40.4                01/24/2024                MCV                      86.9                01/24/2024                PLT                      200                 01/24/2024              Anesthesia Other Findings   Reproductive/Obstetrics                              Anesthesia Physical Anesthesia Plan  ASA: 3  Anesthesia Plan: General   Post-op Pain Management:    Induction: Intravenous  PONV Risk Score and Plan: 2 and Ondansetron , Dexamethasone  and Treatment may vary due to age or medical condition  Airway Management Planned: Mask and LMA  Additional Equipment: None  Intra-op Plan:   Post-operative Plan: Extubation in OR  Informed Consent: I have reviewed the patients History and Physical, chart, labs and discussed the procedure including the risks, benefits and alternatives for the proposed anesthesia with the  patient or authorized representative  who has indicated his/her understanding and acceptance.     Dental advisory given  Plan Discussed with: CRNA  Anesthesia Plan Comments:         Anesthesia Quick Evaluation

## 2024-01-25 NOTE — Interval H&P Note (Signed)
 History and Physical Interval Note:  01/25/2024 7:28 AM  Calvin Santiago  has presented today for surgery, with the diagnosis of LEFT URETERAL STONE.  The various methods of treatment have been discussed with the patient and family. After consideration of risks, benefits and other options for treatment, the patient has consented to  Procedure(s): CYSTOSCOPY, WITH RETROGRADE PYELOGRAM AND URETERAL STENT INSERTION (Left) as a surgical intervention.  The patient's history has been reviewed, patient examined, no change in status, stable for surgery.  I have reviewed the patient's chart and labs.  Questions were answered to the patient's satisfaction.     [name]

## 2024-01-25 NOTE — Progress Notes (Signed)
 PROGRESS NOTE    Calvin Santiago  FMW:[id number] DOB: [birth year] DOA: 01/23/2024 PCP: Kip Righter, MD  Outpatient Specialists:     Brief Narrative:  As per H&P done on admission: Calvin Santiago is a 78 y.o. male with medical history significant for bipolar disorder and prostate cancer 2 years ago.  The patient has been struggling with left-sided back pain for the last 10 days.  He has seen an orthopedic doctor.  He has been taking pain medication.  He has plans to start physical therapy.  He has also been having trouble with burning with urination and feeling like not a lot of urine is coming out when he does go. Earlier in the week he had a day or so where he could not urinate at all where nothing would come out.  That resolved but he still urinating very small amounts.  This morning at 5 AM he started sweating profusely.  He had a temperature of 102.  His wife called the orthopedic doctor who recommended that he come to the emergency department. In the ER his workup included a renal CT and he was found to have left-sided hydronephrosis with a 9 mm ureter stone.  He did not have a fever or elevated white blood cell count in the ED but urology has been called and the patient will be admitted.    01/24/2024: Patient seen.  No new complaints.  Urology input is appreciated.  Urology team has advised starting Flomax  0.4 mg p.o. once daily straining all urine.  Urology also advised n.p.o. after midnight  (Reevaluate for cystoscopy with basket extraction versus ureteral stent placement tomorrow morning or if he begins fevering/decompensates overnight.  01/25/2024: Seen alongside patient's wife.  Patient underwent cystoscopy/ureteroscopy/holmium laser/stent placement (left ureter) earlier today.  Urine culture has grown Enterococcus faecalis sensitive to ampicillin of same.  We changed the antibiotics to amoxicillin .   Assessment & Plan:   Principal Problem:   Hydronephrosis concurrent with and due to  calculi of kidney and ureter Active Problems:   Major depressive disorder, recurrent episode, severe (HCC)   Seizure (HCC)   Dementia without behavioral disturbance (HCC)   Vitamin B12 deficiency   Kidney stone   Left hydronephrosis: -9 mm ureteral stone - Urology input is appreciated. - Patient underwent cystoscopy/ureteroscopy/holmium laser/stent placement (left ureter) earlier today.   Acute kidney injury: -Scr of 1.75 on presentation (Baseline Scr 1.24) -Scr of 1.28 today.   -UA revealed SG of 1.019 -AKI is likely multifactorial -Urine culture has grown enterococcus faecalis -Gentle and cautious IV fluids, and monitor creatinine -Treat UTI -Nephrolithiasis with hydronephrosis, status post urology procedure -Monitor renal function and electrolyes -Keep MAP greater than 65 mmHg   UTI with Hematuria: -Urine culture has grown Enterococcus Faecalis sensitive to ampicillin and vancomycin -Change antibiotics to amoxicillin .   Bipolar disorder: - Resume  home meds   DVT prophylaxis:  Code Status: Full code Family Communication:  Disposition Plan: Inpatient   Consultants:  Urology  Procedures:  Now for now  Antimicrobials:  IV Rocephin  discontinued. Start amoxicillin .   Subjective: No new complaints.  Objective: Vitals:   01/25/24 0931 01/25/24 0945 01/25/24 1000 01/25/24 1030  BP: 125/75 119/73 126/76 126/75  Pulse: 84 88 82 79  Resp: 14 18 17 16   Temp:    98.3 F (36.8 C)  TempSrc:    Oral  SpO2: 100% 97% 98% 96%  Weight:      Height:        Intake/Output Summary (Last  24 hours) at 01/25/2024 1042 Last data filed at 01/25/2024 0600 Gross per 24 hour  Intake 340 ml  Output 1050 ml  Net -710 ml   Filed Weights   01/23/24 1247  Weight: 120.2 kg    Examination:  General exam: Appears calm and comfortable.  Patient is obese. Respiratory system: Clear to auscultation. Respiratory effort normal. Cardiovascular system: S1 & S2  heard Gastrointestinal system: Abdomen is obese, soft and nontender.   Central nervous system: Alert and oriented.  Extremities: Mild ankle edema.  Data Reviewed: I have personally reviewed following labs and imaging studies  CBC: Recent Labs  Lab 01/23/24 1301 01/24/24 0454  WBC 11.2* 4.8  NEUTROABS 10.4*  --   HGB 13.4 13.3  HCT 40.7 40.4  MCV 88.1 86.9  PLT 110* 200   Basic Metabolic Panel: Recent Labs  Lab 01/23/24 1301 01/24/24 0454  NA 131* 141  K 4.2 3.4*  CL 98 99  CO2 22 31  GLUCOSE 185* 94  BUN 25* <5*  CREATININE 1.75* 0.39*  CALCIUM  9.4 9.8   GFR: Estimated Creatinine Clearance: 103.1 mL/min (A) (by C-G formula based on SCr of 0.39 mg/dL (L)). Liver Function Tests: Recent Labs  Lab 01/23/24 1301  AST 17  ALT 13  ALKPHOS 71  BILITOT 1.2  PROT 6.7  ALBUMIN 3.7   No results for input(s): LIPASE, AMYLASE in the last 168 hours. No results for input(s): AMMONIA in the last 168 hours. Coagulation Profile: No results for input(s): INR, PROTIME in the last 168 hours. Cardiac Enzymes: No results for input(s): CKTOTAL, CKMB, CKMBINDEX, TROPONINI in the last 168 hours. BNP (last 3 results) No results for input(s): PROBNP in the last 8760 hours. HbA1C: No results for input(s): HGBA1C in the last 72 hours. CBG: No results for input(s): GLUCAP in the last 168 hours. Lipid Profile: No results for input(s): CHOL, HDL, LDLCALC, TRIG, CHOLHDL, LDLDIRECT in the last 72 hours. Thyroid  Function Tests: No results for input(s): TSH, T4TOTAL, FREET4, T3FREE, THYROIDAB in the last 72 hours. Anemia Panel: No results for input(s): VITAMINB12, FOLATE, FERRITIN, TIBC, IRON, RETICCTPCT in the last 72 hours. Urine analysis:    Component Value Date/Time   COLORURINE AMBER (A) 01/23/2024 1140   APPEARANCEUR TURBID (A) 01/23/2024 1140   LABSPEC 1.019 01/23/2024 1140   PHURINE 6.0 01/23/2024 1140   GLUCOSEU  NEGATIVE 01/23/2024 1140   HGBUR MODERATE (A) 01/23/2024 1140   BILIRUBINUR NEGATIVE 01/23/2024 1140   KETONESUR NEGATIVE 01/23/2024 1140   PROTEINUR 100 (A) 01/23/2024 1140   NITRITE NEGATIVE 01/23/2024 1140   LEUKOCYTESUR MODERATE (A) 01/23/2024 1140   Sepsis Labs: @LABRCNTIP (procalcitonin:4,lacticidven:4)  ) Recent Results (from the past 240 hours)  Urine Culture     Status: Abnormal   Collection Time: 01/23/24  1:08 PM   Specimen: Urine, Clean Catch  Result Value Ref Range Status   Specimen Description   Final    URINE, CLEAN CATCH Performed at [facility], [address] [place], [place] [address]    Special Requests   Final    NONE Performed at [facility], [address] [place], [place] [address]    Culture >=100,000 COLONIES/mL ENTEROCOCCUS FAECALIS (A)  Final   Report Status 01/25/2024 FINAL  Final   Organism ID, Bacteria ENTEROCOCCUS FAECALIS (A)  Final      Susceptibility   Enterococcus faecalis - MIC*    AMPICILLIN <=2 SENSITIVE Sensitive     NITROFURANTOIN <=16 SENSITIVE Sensitive  VANCOMYCIN 1 SENSITIVE Sensitive     * >=100,000 COLONIES/mL ENTEROCOCCUS FAECALIS         Radiology Studies: DG C-Arm 1-60 Min-No Report Result Date: 01/25/2024 Fluoroscopy was utilized by the requesting physician.  No radiographic interpretation.   DG C-Arm 1-60 Min-No Report Result Date: 01/25/2024 Fluoroscopy was utilized by the requesting physician.  No radiographic interpretation.   CT Renal Stone Study Result Date: 01/23/2024 EXAM: CT ABDOMEN AND PELVIS WITHOUT CONTRAST 01/23/2024 12:50:09 PM TECHNIQUE: CT of the abdomen and pelvis was performed without the administration of intravenous contrast. Multiplanar reformatted images are provided for review. Automated exposure control, iterative reconstruction, and/or weight-based adjustment of the mA/kV was utilized to reduce the radiation dose to as low as reasonably achievable.  COMPARISON: Prostate MRI of 06/13/2017. No prior CT. CLINICAL HISTORY: Abdominal/flank pain, stone suspected. FINDINGS: LOWER CHEST: Normal heart size. Minimal bibasilar scarring. LIVER: Mild hepatic steatosis. Bilateral low density liver lesions, the larger lesions include up to 4.4 cm, are likely cysts. Other smaller lesions are too small to characterize but also most likely cysts. Nonspecific moderate caudate lobe enlargement. No intrahepatic biliary duct dilatation. GALLBLADDER AND BILE DUCTS: Gallbladder is unremarkable. No biliary ductal dilatation. SPLEEN: Normal in size and morphology. PANCREAS: Fatty replacement involving the pancreatic neck, head, uncinate process. No duct dilatation or acute inflammation. ADRENAL GLANDS: No acute abnormality. KIDNEYS, URETERS AND BLADDER: Multiple low density left renal lesions including up to 5.8 cm are likely cysts. Moderate left sided hydroureteronephrosis to the level of a 9 mm stone at the left ureterovesicular junction on image 86/2. Moderate to marked left perinephric interstitial thickening may represent forniceal rupture. A right sided bladder base stone measuring 4 mm. Diverticulum off the bladder dome including at 8.7 cm. GI AND BOWEL: Normal stomach, without wall thickening. Normal small bowel caliber. Scattered colonic diverticula. Normal appendix. There is no bowel obstruction. PERITONEUM AND RETROPERITONEUM: No ascites. No free air. VASCULATURE: Aorta is normal in caliber. Aortic atherosclerosis. LYMPH NODES: No lymphadenopathy. REPRODUCTIVE ORGANS: Mild prostatomegaly. Radiation seeds within. BONES AND SOFT TISSUES: No acute osseous abnormality. Small fat containing right inguinal hernia. Tiny fat containing periumbilical ventral abdominal wall hernia. No focal soft tissue abnormality. IMPRESSION: 1. Moderate left sided hydroureteronephrosis secondary to a 9 mm stone at the left ureterovesical junction. 2. Right sided bladder base stone. 3. Left  perinephric interstitial thickening could simply be secondary to hydronephrosis or superimposed forniceal rupture. 4. Mild prostatomegaly with bladder dome diverticulum, suggesting outlet obstruction. 5. Incidental findings, including: Aortic atherosclerosis (icd10-i70.0). Hepatic steatosis. Electronically signed by: Rockey Kilts MD 01/23/2024 01:32 PM EST RP Workstation: [id number]        Scheduled Meds:  amantadine   100 mg Oral TID   amoxicillin   500 mg Oral Q8H   divalproex   250 mg Oral Q12H   QUEtiapine   100 mg Oral QHS   sertraline   100 mg Oral BID   sodium chloride  flush  3 mL Intravenous Q12H   tamsulosin   0.4 mg Oral Daily   Continuous Infusions:     LOS: 2 days    Time spent: 35 minutes.    Leatrice Chapel, MD  [facility] Pager #: [phone] 7PM-7AM contact night coverage as above

## 2024-01-25 NOTE — Op Note (Signed)
.  Preoperative diagnosis: Left ureteral stone  Postoperative diagnosis: Same  Procedure: 1 cystoscopy 2. Left retrograde pyelography 3.  Intraoperative fluoroscopy, under one hour, with interpretation 4.  Left ureteroscopic stone manipulation with laser lithotripsy 5.  Left 6 x 26 JJ stent placement  Attending: Belvie Standing  Resident: Maurilio Agar  Anesthesia: General  Estimated blood loss: None  Drains: Left 6 x 26 JJ ureteral stent without tether  Specimens: stone for analysis  Antibiotics: rocephin   Findings: left distal ureteral stone. Moderate hydronephrosis. No masses/lesions in the bladder. Ureteral orifices in normal anatomic location.  Indications: Patient is a 78 year old male with a history of left ureteral stone who has failed medical expulsive therapy.  After discussing treatment options, he decided proceed with left ureteroscopic stone manipulation.  Procedure in detail: The patient was brought to the operating room and a brief timeout was done to ensure correct patient, correct procedure, correct site.  General anesthesia was administered patient was placed in dorsal lithotomy position.  Her genitalia was then prepped and draped in usual sterile fashion.  A rigid 22 French cystoscope was passed in the urethra and the bladder.  Bladder was inspected free masses or lesions.  the ureteral orifices were in the normal orthotopic locations.  a 6 french ureteral catheter was then instilled into the left ureteral orifice.  a gentle retrograde was obtained and findings noted above.  we then placed a zip wire through the ureteral catheter and advanced up to the renal pelvis.  we then removed the cystoscope and cannulated the left ureteral orifice with a semirigid ureteroscope. We located the stone in the distal ureter and using a 242 nm laser fiber and fragmented the stone into smaller pieces.  the pieces were then removed with a Ngage basket. Once all stone fragments were removed  we then placed a 6 x 26 double-j ureteral stent over the original zip wire.   We then removed the wire and good coil was noted in the the renal pelvis under fluoroscopy and the bladder under direct vision.    the stone fragments were then removed from the bladder and sent for analysis.   the bladder was then drained and this concluded the procedure which was well tolerated by patient.  Complications: None  Condition: Stable, extubated, transferred to PACU  Plan: Patient is to be discharged home as to follow-up in one week for stent removal.

## 2024-01-25 NOTE — Transfer of Care (Signed)
 Immediate Anesthesia Transfer of Care Note  Patient: Calvin Santiago  Procedure(s) Performed: CYSTOSCOPY/URETEROSCOPY/HOLMIUM LASER/STENT PLACEMENT (Left: Ureter)  Patient Location: PACU  Anesthesia Type:General  Level of Consciousness: drowsy, patient cooperative, and responds to stimulation  Airway & Oxygen Therapy: Patient Spontanous Breathing and Patient connected to face mask oxygen  Post-op Assessment: Report given to RN and Post -op Vital signs reviewed and stable  Post vital signs: Reviewed and stable  Last Vitals:  Vitals Value Taken Time  BP 111/73 01/25/24 09:16  Temp 36.8 C 01/25/24 09:16  Pulse 87 01/25/24 09:22  Resp 14 01/25/24 09:22  SpO2 100 % 01/25/24 09:22  Vitals shown include unfiled device data.  Last Pain:  Vitals:   01/25/24 0916  TempSrc:   PainSc: Asleep         Complications: No notable events documented.

## 2024-01-26 ENCOUNTER — Encounter (HOSPITAL_COMMUNITY): Payer: Self-pay | Admitting: Urology

## 2024-01-26 DIAGNOSIS — N132 Hydronephrosis with renal and ureteral calculous obstruction: Secondary | ICD-10-CM | POA: Diagnosis not present

## 2024-01-26 LAB — RENAL FUNCTION PANEL
Albumin: 2.9 g/dL — ABNORMAL LOW (ref 3.5–5.0)
Anion gap: 8 (ref 5–15)
BUN: 32 mg/dL — ABNORMAL HIGH (ref 8–23)
CO2: 24 mmol/L (ref 22–32)
Calcium: 8.6 mg/dL — ABNORMAL LOW (ref 8.9–10.3)
Chloride: 102 mmol/L (ref 98–111)
Creatinine, Ser: 1.37 mg/dL — ABNORMAL HIGH (ref 0.61–1.24)
GFR, Estimated: 52 mL/min — ABNORMAL LOW (ref >60)
Glucose, Bld: 134 mg/dL — ABNORMAL HIGH (ref 70–99)
Phosphorus: 3.1 mg/dL (ref 2.5–4.6)
Potassium: 4.4 mmol/L (ref 3.5–5.1)
Sodium: 134 mmol/L — ABNORMAL LOW (ref 135–145)

## 2024-01-26 LAB — CBC WITH DIFFERENTIAL/PLATELET
Abs Immature Granulocytes: 0.07 K/uL (ref 0.00–0.07)
Basophils Absolute: 0 K/uL (ref 0.0–0.1)
Basophils Relative: 0 %
Eosinophils Absolute: 0 K/uL (ref 0.0–0.5)
Eosinophils Relative: 0 %
HCT: 35.9 % — ABNORMAL LOW (ref 39.0–52.0)
Hemoglobin: 11.5 g/dL — ABNORMAL LOW (ref 13.0–17.0)
Immature Granulocytes: 1 %
Lymphocytes Relative: 8 %
Lymphs Abs: 0.5 K/uL — ABNORMAL LOW (ref 0.7–4.0)
MCH: 28.5 pg (ref 26.0–34.0)
MCHC: 32 g/dL (ref 30.0–36.0)
MCV: 89.1 fL (ref 80.0–100.0)
Monocytes Absolute: 0.9 K/uL (ref 0.1–1.0)
Monocytes Relative: 13 %
Neutro Abs: 5.3 K/uL (ref 1.7–7.7)
Neutrophils Relative %: 78 %
Platelets: 148 K/uL — ABNORMAL LOW (ref 150–400)
RBC: 4.03 MIL/uL — ABNORMAL LOW (ref 4.22–5.81)
RDW: 14.5 % (ref 11.5–15.5)
WBC: 6.8 K/uL (ref 4.0–10.5)
nRBC: 0 % (ref 0.0–0.2)

## 2024-01-26 LAB — MAGNESIUM: Magnesium: 2.4 mg/dL (ref 1.7–2.4)

## 2024-01-26 MED ORDER — TAMSULOSIN HCL 0.4 MG PO CAPS: 0.4000 mg | ORAL_CAPSULE | Freq: Every day | ORAL | 1 refills | Status: AC

## 2024-01-26 MED ORDER — ACETAMINOPHEN 325 MG PO TABS: 650.0000 mg | ORAL_TABLET | Freq: Four times a day (QID) | ORAL | Status: AC | PRN

## 2024-01-26 MED ORDER — AMOXICILLIN 500 MG PO CAPS: 500.0000 mg | ORAL_CAPSULE | Freq: Three times a day (TID) | ORAL | 0 refills | Status: DC

## 2024-01-26 MED ORDER — CEPHALEXIN 500 MG PO CAPS: 500.0000 mg | ORAL_CAPSULE | Freq: Three times a day (TID) | ORAL | 0 refills | Status: AC

## 2024-01-26 NOTE — Discharge Instructions (Signed)
 Be sure to follow-up for stent removal -[facility] [phone] - Dr Nieves

## 2024-01-26 NOTE — Consult Note (Signed)
 Urology Consult Note   Requesting Attending Physician:  Rosario Leatrice FERNS, MD Service Providing Consult: Urology  Consulting Attending: Dr. Nieves   Reason for Consult: Ureteral stone with lactic acidosis  HPI: Calvin Santiago is seen in consultation for reasons noted above at the request of Rosario Leatrice I, MD. Patient is a 78 y.o. male presenting with worsening left flank pain, hematuria, fever, and lactic acidosis.  He denies previous history with nephrolithiasis.  He initially thought his lower back pain was spinal and visited his orthopedist who confirmed that it was not spinal in nature and directed him to the emergency department.  On arrival CT A/P noted 9 mm left UVJ stone, lactic acidosis of 2.1, mild leukocytosis, and patient was afebrile.  On my arrival he was alert, oriented, and resting comfortably in bed after administration of pain medication.  We reviewed the natural history of stone production and available treatment options to him including infographic's.  All questions were answered to their satisfaction.  Interval 11/24: AFVSS. Patient doing well POD1 left ureteroscopic stone extraction. Stent is in place. Cr 1.37 from 1.3. Hgb 11.5 from 13.3. Patient voiding.  ------------------  Assessment:   78 y.o. male s/p left ureteroscopy for symptomatic 9 mm left UVJ stone doing well POD1.   Recommendations:  Continue Flomax  0.4 mg daily with stent in place  Patient ok for discharge from Urology standpoint Plan for 1-2 week clinic follow-up for cysto/stent removal  Please discharge patient with Keflex  500 mg TID x3 days to begin the day prior to his appointment for stent removal.   Past Medical History: Past Medical History:  Diagnosis Date   Anxiety    Bipolar 2 disorder (HCC)    Depression    Prostate cancer [facility])     Past Surgical History:  Past Surgical History:  Procedure Laterality Date   CYSTOSCOPY/URETEROSCOPY/HOLMIUM LASER/STENT PLACEMENT Left  01/25/2024   Procedure: CYSTOSCOPY/URETEROSCOPY/HOLMIUM LASER/STENT PLACEMENT;  Surgeon: Sherrilee Belvie CROME, MD;  Location: [facility];  Service: Urology;  Laterality: Left;   PROSTATE BIOPSY     RADIOLOGY WITH ANESTHESIA N/A 01/22/2018   Procedure: MRI of the brain WITH ANESTHESIA;  Surgeon: Radiologist, Medication, MD;  Location: [facility] OR;  Service: Radiology;  Laterality: N/A;   REPLACEMENT TOTAL KNEE     bilateral    Medication: Current Facility-Administered Medications  Medication Dose Route Frequency Provider Last Rate Last Admin   acetaminophen  (TYLENOL ) tablet 650 mg  650 mg Oral Q6H PRN Sherrilee Belvie CROME, MD   650 mg at 01/25/24 1939   Or   acetaminophen  (TYLENOL ) suppository 650 mg  650 mg Rectal Q6H PRN Sherrilee Belvie CROME, MD       amantadine  (SYMMETREL ) capsule 100 mg  100 mg Oral TID Sherrilee Belvie CROME, MD   100 mg at 01/25/24 2136   amoxicillin  (AMOXIL ) capsule 500 mg  500 mg Oral Q8H Ogbata, Sylvester I, MD   500 mg at 01/26/24 0533   bisacodyl  (DULCOLAX) EC tablet 5 mg  5 mg Oral Daily PRN Sherrilee Belvie CROME, MD   5 mg at 01/25/24 1938   divalproex  (DEPAKOTE ) DR tablet 250 mg  250 mg Oral Q12H Sherrilee Belvie CROME, MD   250 mg at 01/25/24 2140   HYDROmorphone  (DILAUDID ) injection 0.5 mg  0.5 mg Intravenous Q3H PRN Sherrilee Belvie CROME, MD   0.5 mg at 01/26/24 9472   ondansetron  (ZOFRAN ) tablet 4 mg  4 mg Oral Q6H PRN Sherrilee Belvie CROME, MD  Or   ondansetron  (ZOFRAN ) injection 4 mg  4 mg Intravenous Q6H PRN McKenzie, Belvie CROME, MD       QUEtiapine  (SEROQUEL ) tablet 100 mg  100 mg Oral QHS Sherrilee Belvie CROME, MD   100 mg at 01/25/24 2135   sertraline  (ZOLOFT ) tablet 100 mg  100 mg Oral BID Sherrilee Belvie CROME, MD   100 mg at 01/25/24 2135   sodium chloride  flush (NS) 0.9 % injection 3 mL  3 mL Intravenous Q12H Sherrilee Belvie CROME, MD   3 mL at 01/24/24 2141   tamsulosin  (FLOMAX ) capsule 0.4 mg  0.4 mg Oral Daily Sherrilee Belvie CROME, MD   0.4 mg at 01/24/24 2029     Allergies: Allergies  Allergen Reactions   Sulfa Antibiotics Other (See Comments)    As a child, he ran into walls and would bang his head against the wall    Social History: Social History   Tobacco Use   Smoking status: Never   Smokeless tobacco: Never  Vaping Use   Vaping status: Unknown  Substance Use Topics   Alcohol use: Never   Drug use: Never    Family History Family History  Problem Relation Age of Onset   Prostate cancer Father    Prostate cancer Brother    Prostate cancer Maternal Grandfather    Breast cancer Neg Hx    Pancreatic cancer Neg Hx    Colon cancer Neg Hx     Review of Systems  Genitourinary:  Positive for hematuria. Negative for dysuria, flank pain, frequency and urgency.     Objective   Vital signs in last 24 hours: BP (!) 141/83 (BP Location: Right Arm)   Pulse 68   Temp 97.8 F (36.6 C)   Resp 18   Ht 6' 2 (1.88 m)   Wt 120.2 kg   SpO2 96%   BMI 34.02 kg/m   Physical Exam General: A&O, resting, appropriate HEENT: St[place]/AT Pulmonary: Normal work of breathing Cardiovascular: no cyanosis    Most Recent Labs: Lab Results  Component Value Date   WBC 6.8 01/26/2024   HGB 11.5 (L) 01/26/2024   HCT 35.9 (L) 01/26/2024   PLT 148 (L) 01/26/2024    Lab Results  Component Value Date   NA 134 (L) 01/26/2024   K 4.4 01/26/2024   CL 102 01/26/2024   CO2 24 01/26/2024   BUN 32 (H) 01/26/2024   CREATININE 1.37 (H) 01/26/2024   CALCIUM  8.6 (L) 01/26/2024   MG 2.4 01/26/2024   PHOS 3.1 01/26/2024    No results found for: INR, APTT   Urine Culture: @[id number] (laburin,org,r9620,r9621)@   IMAGING: DG C-Arm 1-60 Min-No Report Result Date: 01/25/2024 Fluoroscopy was utilized by the requesting physician.  No radiographic interpretation.   DG C-Arm 1-60 Min-No Report Result Date: 01/25/2024 Fluoroscopy was utilized by the requesting physician.  No radiographic interpretation.    ------

## 2024-01-26 NOTE — Discharge Summary (Signed)
 Physician Discharge Summary  Patient ID: Calvin Santiago MRN: [id number] DOB/AGE: 1945-05-05 78 y.o.  Admit date: 01/23/2024 Discharge date: 01/26/2024  Admission Diagnoses:  Discharge Diagnoses:  Principal Problem:   Hydronephrosis concurrent with and due to calculi of kidney and ureter Active Problems:   Major depressive disorder, recurrent episode, severe (HCC)   Seizure (HCC)   Dementia without behavioral disturbance (HCC)   Vitamin B12 deficiency   Kidney stone   Discharged Condition: {condition:18240}  Hospital Course: ***  Consults: {consultation:18241}  Significant Diagnostic Studies: {diagnostics:18242}  Treatments: {Tx:18249}  Discharge Exam: Blood pressure 130/86, pulse 79, temperature (!) 97.5 F (36.4 C), temperature source Oral, resp. rate 18, height 6' 2 (1.88 m), weight 120.2 kg, SpO2 97%. {physical zkjf:6958869}  Disposition: Discharge disposition: 01-Home or Self Care       Discharge Instructions     Diet - low sodium heart healthy   Complete by: As directed    Discharge wound care:   Complete by: As directed    Continue current wound care plan.   Increase activity slowly   Complete by: As directed       Allergies as of 01/26/2024       Reactions   Sulfa Antibiotics Other (See Comments)   As a child, he ran into walls and would bang his head against the wall        Medication List     STOP taking these medications    cyanocobalamin  1000 MCG tablet Commonly known as: VITAMIN B12   folic acid  1 MG tablet Commonly known as: FOLVITE    HYDROcodone-acetaminophen  10-325 MG tablet Commonly known as: NORCO   Ibuprofen  200 MG Caps   NON FORMULARY   Voltaren 1 % Gel Generic drug: diclofenac Sodium       TAKE these medications    acetaminophen  325 MG tablet Commonly known as: TYLENOL  Take 2 tablets (650 mg total) by mouth every 6 (six) hours as needed for mild pain (pain score 1-3) or fever (or Fever >/= 101).    amantadine  100 MG capsule Commonly known as: SYMMETREL  Take 100 mg by mouth in the morning, at noon, and at bedtime.   amoxicillin  500 MG capsule Commonly known as: AMOXIL  Take 1 capsule (500 mg total) by mouth every 8 (eight) hours for 7 days.   cephALEXin  500 MG capsule Commonly known as: KEFLEX  Take 1 capsule (500 mg total) by mouth 3 (three) times daily for 5 days.   divalproex  250 MG DR tablet Commonly known as: DEPAKOTE  Take 250 mg by mouth in the morning and at bedtime.   QUEtiapine  100 MG tablet Commonly known as: SEROQUEL  Take 100 mg by mouth at bedtime.   sertraline  100 MG tablet Commonly known as: ZOLOFT  Take 100 mg by mouth 2 (two) times daily.   tamsulosin  0.4 MG Caps capsule Commonly known as: FLOMAX  Take 1 capsule (0.4 mg total) by mouth daily. Start taking on: January 27, 2024               Discharge Care Instructions  (From admission, onward)           Start     Ordered   01/26/24 0000  Discharge wound care:       Comments: Continue current wound care plan.   01/26/24 1349            Follow-up Information     Nieves Cough, MD. Call.   Specialty: Urology Why: be sure to follow-up for stent removal Contact information:  [address] [place] [address] [phone]         Kip Righter, MD Follow up in 1 week(s).   Specialty: Family Medicine Contact information: [address] [place] [address] [phone]                 Signed: Leatrice LILLETTE Chapel 01/26/2024, 1:49 PM

## 2024-02-02 ENCOUNTER — Other Ambulatory Visit: Payer: Self-pay | Admitting: Urology

## 2024-02-02 NOTE — Patient Instructions (Signed)
 SURGICAL WAITING ROOM VISITATION Patients having surgery or a procedure may have no more than 2 support people in the waiting area - these visitors may rotate.    Children under the age of 66 must have an adult with them who is not the patient.  If the patient needs to stay at the hospital during part of their recovery, the visitor guidelines for inpatient rooms apply. Pre-op nurse will coordinate an appropriate time for 1 support person to accompany patient in pre-op.  This support person may not rotate.    Please refer to the [facility] website for the visitor guidelines for Inpatients (after your surgery is over and you are in a regular room).       Your procedure is scheduled on: 02-06-24   Report to [facility] Main Entrance    Report to admitting at 7:45 AM   Call this number if you have problems the morning of surgery [phone]   Do not eat food or drink liquids:After Midnight.           If you have questions, please contact your surgeon's office.   FOLLOW  ANY ADDITIONAL PRE OP INSTRUCTIONS YOU RECEIVED FROM YOUR SURGEON'S OFFICE!!!     Oral Hygiene is also important to reduce your risk of infection.                                    Remember - BRUSH YOUR TEETH THE MORNING OF SURGERY WITH YOUR REGULAR TOOTHPASTE   Do NOT smoke after Midnight   Take these medicines the morning of surgery with A SIP OF WATER :    Amantadine    Divalproex    Lorazepam    Sertraline    Tamsulosin   Stop all vitamins and herbal supplements 7 days before surgery  Bring CPAP mask and tubing day of surgery.                              You may not have any metal on your body including jewelry, and body piercing             Do not wear lotions, powders, cologne, or deodorant              Men may shave face and neck.   Do not bring valuables to the hospital. [facility] IS NOT RESPONSIBLE   FOR VALUABLES.   Contacts, dentures or bridgework may not be worn into surgery.  DO  NOT BRING YOUR HOME MEDICATIONS TO THE HOSPITAL. PHARMACY WILL DISPENSE MEDICATIONS LISTED ON YOUR MEDICATION LIST TO YOU DURING YOUR ADMISSION IN THE HOSPITAL!    Patients discharged on the day of surgery will not be allowed to drive home.  Someone NEEDS to stay with you for the first 24 hours after anesthesia.   Special Instructions: Bring a copy of your healthcare power of attorney and living will documents the day of surgery if you haven't scanned them before.              Please read over the following fact sheets you were given: IF YOU HAVE QUESTIONS ABOUT YOUR PRE-OP INSTRUCTIONS PLEASE CALL [phone] Gwen  If you received a COVID test during your pre-op visit  it is requested that you wear a mask when out in public, stay away from anyone that may not be feeling well and notify your surgeon if you  develop symptoms. If you test positive for Covid or have been in contact with anyone that has tested positive in the last 10 days please notify you surgeon.  [facility] - Preparing for Surgery Before surgery, you can play an important role.  Because skin is not sterile, your skin needs to be as free of germs as possible.  You can reduce the number of germs on your skin by washing with CHG (chlorahexidine gluconate) soap before surgery.  CHG is an antiseptic cleaner which kills germs and bonds with the skin to continue killing germs even after washing. Please DO NOT use if you have an allergy to CHG or antibacterial soaps.  If your skin becomes reddened/irritated stop using the CHG and inform your nurse when you arrive at Short Stay. Do not shave (including legs and underarms) for at least 48 hours prior to the first CHG shower.  You may shave your face/neck.  Please follow these instructions carefully:  1.  Shower with CHG Soap the night before surgery and the  morning of surgery.  2.  If you choose to wash your hair, wash your hair first as usual with your normal  shampoo.  3.  After you  shampoo, rinse your hair and body thoroughly to remove the shampoo.                             4.  Use CHG as you would any other liquid soap.  You can apply chg directly to the skin and wash.  Gently with a scrungie or clean washcloth.  5.  Apply the CHG Soap to your body ONLY FROM THE NECK DOWN.   Do   not use on face/ open                           Wound or open sores. Avoid contact with eyes, ears mouth and   genitals (private parts).                       Wash face,  Genitals (private parts) with your normal soap.             6.  Wash thoroughly, paying special attention to the area where your    surgery  will be performed.  7.  Thoroughly rinse your body with warm water  from the neck down.  8.  DO NOT shower/wash with your normal soap after using and rinsing off the CHG Soap.                9.  Pat yourself dry with a clean towel.            10.  Wear clean pajamas.            11.  Place clean sheets on your bed the night of your first shower and do not  sleep with pets. Day of Surgery : Do not apply any lotions/deodorants the morning of surgery.  Please wear clean clothes to the hospital/surgery center.  FAILURE TO FOLLOW THESE INSTRUCTIONS MAY RESULT IN THE CANCELLATION OF YOUR SURGERY  PATIENT SIGNATURE_________________________________  NURSE SIGNATURE__________________________________  ________________________________________________________________________

## 2024-02-04 ENCOUNTER — Encounter (HOSPITAL_COMMUNITY): Payer: Self-pay

## 2024-02-04 ENCOUNTER — Encounter (HOSPITAL_COMMUNITY)
Admission: RE | Admit: 2024-02-04 | Discharge: 2024-02-04 | Disposition: A | Discharge status: Home / Self Care | Source: Ambulatory Visit | Attending: Urology | Admitting: Urology

## 2024-02-04 ENCOUNTER — Other Ambulatory Visit: Payer: Self-pay

## 2024-02-04 NOTE — Progress Notes (Signed)
 Date of COVID positive in last 90 days:  No  PCP - [employer], MD Cardiologist - N/A  Chest x-ray - N/A EKG - N/A Stress Test - N/A ECHO - N/A Cardiac Cath - N/A Pacemaker/ICD device last checked:N/A Spinal Cord Stimulator:N/A  Bowel Prep - N/A  Sleep Study - N/A CPAP -   Fasting Blood Sugar - N/A Checks Blood Sugar _____ times a day  Last dose of GLP1 agonist-  N/A GLP1 instructions:  Do not take after     Last dose of SGLT-2 inhibitors-  N/A SGLT-2 instructions:  Do not take after    Blood Thinner Instructions: N/A Last dose:   Time: Aspirin Instructions:N/A Last Dose:  Activity level:   Difficulty climbing stairs due to balance issues.  Able to perform activities of daily living without stopping and without symptoms of chest pain or shortness of breath.  Pt is having some weakness since last surgery.  Anesthesia review: N/A  Patient denies shortness of breath, fever, cough and chest pain at PAT appointment (completed over the phone)  Patient verbalized understanding of instructions that were given to them at the PAT appointment. Patient was also instructed that they will need to review over the PAT instructions again at home before surgery.

## 2024-02-05 ENCOUNTER — Encounter (HOSPITAL_COMMUNITY): Payer: Self-pay | Admitting: Urology

## 2024-02-06 ENCOUNTER — Encounter: Admission: RE | Disposition: A | Payer: Self-pay | Attending: Urology

## 2024-02-06 ENCOUNTER — Ambulatory Visit (HOSPITAL_COMMUNITY)

## 2024-02-06 ENCOUNTER — Encounter (HOSPITAL_COMMUNITY): Payer: Self-pay | Admitting: Urology

## 2024-02-06 ENCOUNTER — Ambulatory Visit (HOSPITAL_COMMUNITY): Admission: RE | Admit: 2024-02-06 | Discharge: 2024-02-06 | Disposition: A | Attending: Urology | Admitting: Urology

## 2024-02-06 ENCOUNTER — Encounter (HOSPITAL_COMMUNITY): Payer: Self-pay | Admitting: Anesthesiology

## 2024-02-06 ENCOUNTER — Other Ambulatory Visit: Payer: Self-pay

## 2024-02-06 ENCOUNTER — Ambulatory Visit (HOSPITAL_COMMUNITY): Payer: Self-pay | Admitting: Anesthesiology

## 2024-02-06 DIAGNOSIS — Z466 Encounter for fitting and adjustment of urinary device: Secondary | ICD-10-CM | POA: Diagnosis not present

## 2024-02-06 HISTORY — PX: CYSTOSCOPY W/ URETERAL STENT PLACEMENT: SHX1429

## 2024-02-06 HISTORY — PX: CYSTOSCOPY W/ RETROGRADES: SHX1426

## 2024-02-06 SURGERY — CYSTOSCOPY, FLEXIBLE, WITH STENT REPLACEMENT
Anesthesia: General | Site: Penis | Laterality: Left

## 2024-02-06 MED ORDER — STERILE WATER FOR IRRIGATION IR SOLN
Status: DC | PRN
  Administered 2024-02-06: 1000 mL

## 2024-02-06 MED ORDER — OXYCODONE HCL 5 MG PO TABS: 5.0000 mg | ORAL_TABLET | Freq: Once | ORAL | Status: DC | PRN

## 2024-02-06 MED ORDER — VANCOMYCIN HCL 1000 MG IV SOLR
INTRAVENOUS | Status: DC | PRN
  Administered 2024-02-06: 1000 mg via INTRAVENOUS

## 2024-02-06 MED ORDER — ORAL CARE MOUTH RINSE: 15.0000 mL | Freq: Once | OROMUCOSAL | Status: AC

## 2024-02-06 MED ORDER — DEXAMETHASONE SODIUM PHOSPHATE 4 MG/ML IJ SOLN
INTRAMUSCULAR | Status: DC | PRN
  Administered 2024-02-06: 8 mg via INTRAVENOUS

## 2024-02-06 MED ORDER — PROPOFOL 10 MG/ML IV BOLUS
INTRAVENOUS | Status: DC | PRN
  Administered 2024-02-06: 160 mg via INTRAVENOUS

## 2024-02-06 MED ORDER — CHLORHEXIDINE GLUCONATE 0.12 % MT SOLN
15.0000 mL | Freq: Once | OROMUCOSAL | Status: AC
  Administered 2024-02-06: 15 mL via OROMUCOSAL

## 2024-02-06 MED ORDER — LACTATED RINGERS IV SOLN: INTRAVENOUS | Status: DC

## 2024-02-06 MED ORDER — OXYCODONE HCL 5 MG/5ML PO SOLN: 5.0000 mg | Freq: Once | ORAL | Status: DC | PRN

## 2024-02-06 MED ORDER — ONDANSETRON HCL 4 MG/2ML IJ SOLN
INTRAMUSCULAR | Status: DC | PRN
  Administered 2024-02-06: 4 mg via INTRAVENOUS

## 2024-02-06 MED ORDER — TAMSULOSIN HCL 0.4 MG PO CAPS: 0.4000 mg | ORAL_CAPSULE | Freq: Every day | ORAL | 3 refills | Status: AC

## 2024-02-06 MED ORDER — ACETAMINOPHEN 10 MG/ML IV SOLN: 1000.0000 mg | Freq: Once | INTRAVENOUS | Status: DC | PRN

## 2024-02-06 MED ORDER — DROPERIDOL 2.5 MG/ML IJ SOLN: 0.6250 mg | Freq: Once | INTRAMUSCULAR | Status: DC | PRN

## 2024-02-06 MED ORDER — FENTANYL CITRATE (PF) 50 MCG/ML IJ SOSY: 25.0000 ug | PREFILLED_SYRINGE | INTRAMUSCULAR | Status: DC | PRN

## 2024-02-06 MED ORDER — FENTANYL CITRATE (PF) 100 MCG/2ML IJ SOLN
INTRAMUSCULAR | Status: DC | PRN
  Administered 2024-02-06: 50 ug via INTRAVENOUS

## 2024-02-06 MED ORDER — FENTANYL CITRATE (PF) 100 MCG/2ML IJ SOLN
INTRAMUSCULAR | Status: AC
  Filled 2024-02-06: qty 2

## 2024-02-06 MED ORDER — FINASTERIDE 5 MG PO TABS: 5.0000 mg | ORAL_TABLET | Freq: Every day | ORAL | 3 refills | Status: AC

## 2024-02-06 MED ORDER — TRAMADOL HCL 50 MG PO TABS: 50.0000 mg | ORAL_TABLET | Freq: Four times a day (QID) | ORAL | 0 refills | Status: AC | PRN

## 2024-02-06 MED ORDER — CEFAZOLIN SODIUM-DEXTROSE 2-4 GM/100ML-% IV SOLN
2.0000 g | INTRAVENOUS | Status: AC
  Administered 2024-02-06: 2 g via INTRAVENOUS
  Filled 2024-02-06: qty 100

## 2024-02-06 MED ORDER — VANCOMYCIN HCL IN DEXTROSE 1-5 GM/200ML-% IV SOLN
INTRAVENOUS | Status: DC
  Filled 2024-02-06: qty 200

## 2024-02-06 MED ORDER — LIDOCAINE HCL (CARDIAC) PF 100 MG/5ML IV SOSY
PREFILLED_SYRINGE | INTRAVENOUS | Status: DC | PRN
  Administered 2024-02-06: 80 mg via INTRAVENOUS

## 2024-02-06 SURGICAL SUPPLY — 14 items
BAG URO CATCHER STRL LF (MISCELLANEOUS) ×1 IMPLANT
BASKET ZERO TIP NITINOL 2.4FR (BASKET) IMPLANT
CATH URETL OPEN END 6FR 70 (CATHETERS) IMPLANT
CLOTH BEACON ORANGE TIMEOUT ST (SAFETY) ×1 IMPLANT
GLOVE SURG LX STRL 7.5 STRW (GLOVE) ×1 IMPLANT
GOWN STRL REUS W/ TWL XL LVL3 (GOWN DISPOSABLE) ×1 IMPLANT
GUIDEWIRE ANG ZIPWIRE 038X150 (WIRE) ×1 IMPLANT
GUIDEWIRE STR DUAL SENSOR (WIRE) ×1 IMPLANT
KIT TURNOVER KIT A (KITS) ×1 IMPLANT
MANIFOLD NEPTUNE II (INSTRUMENTS) ×1 IMPLANT
NS IRRIG 1000ML POUR BTL (IV SOLUTION) IMPLANT
PACK CYSTO (CUSTOM PROCEDURE TRAY) ×1 IMPLANT
TUBE PU 8FR 16IN ENFIT (TUBING) IMPLANT
TUBING CONNECTING 10 (TUBING) ×1 IMPLANT

## 2024-02-06 NOTE — Anesthesia Preprocedure Evaluation (Signed)
 Anesthesia Evaluation  Patient identified by MRN, date of birth, ID band Patient awake    Reviewed: Allergy & Precautions, NPO status , Patient's Chart, lab work & pertinent test results  History of Anesthesia Complications Negative for: history of anesthetic complications  Airway Mallampati: III  TM Distance: >3 FB Neck ROM: Full    Dental  (+) Poor Dentition, Caps, Partial Lower   Pulmonary neg pulmonary ROS, neg sleep apnea, neg COPD, Patient abstained from smoking.Not current smoker, former smoker   Pulmonary exam normal        Cardiovascular Exercise Tolerance: Good METS(-) hypertension(-) CAD and (-) Past MI negative cardio ROS (-) dysrhythmias  Rhythm:Regular Rate:Normal     Neuro/Psych Seizures -,  PSYCHIATRIC DISORDERS Anxiety Depression Bipolar Disorder  Dementia    GI/Hepatic negative GI ROS, Neg liver ROS,neg GERD  ,,  Endo/Other  negative endocrine ROSneg diabetes    Renal/GU Renal disease  negative genitourinary   Musculoskeletal negative musculoskeletal ROS (+)    Abdominal  (+) + obese  Peds  Hematology Lab Results      Component                Value               Date                      WBC                      4.8                 01/24/2024                HGB                      13.3                01/24/2024                HCT                      40.4                01/24/2024                MCV                      86.9                01/24/2024                PLT                      200                 01/24/2024              Anesthesia Other Findings Past Medical History: No date: Anxiety No date: Bipolar 2 disorder (HCC) No date: Depression No date: Prostate cancer (HCC)  Reproductive/Obstetrics                              Anesthesia Physical Anesthesia Plan  ASA: 3  Anesthesia Plan: General   Post-op Pain Management: Ofirmev  IV (intra-op)*    Induction: Intravenous  PONV Risk Score and Plan: 3 and Ondansetron , Dexamethasone  and Treatment  may vary due to age or medical condition  Airway Management Planned: LMA  Additional Equipment: None  Intra-op Plan:   Post-operative Plan: Extubation in OR  Informed Consent: I have reviewed the patients History and Physical, chart, labs and discussed the procedure including the risks, benefits and alternatives for the proposed anesthesia with the patient or authorized representative who has indicated his/her understanding and acceptance.     Dental advisory given  Plan Discussed with: CRNA  Anesthesia Plan Comments: (Discussed risks of anesthesia with patient, including PONV, sore throat, lip/dental/eye damage. Rare risks discussed as well, such as cardiorespiratory and neurological sequelae, and allergic reactions. Discussed the role of CRNA in patient's perioperative care. Patient understands.)         Anesthesia Quick Evaluation

## 2024-02-06 NOTE — H&P (Signed)
 Calvin Santiago is an 78 y.o. male.    Chief Complaint: Pre-OP LEFT Ureteral Stent Pull  HPI:   1 - Urolithiasis / Retained LEFT Ureteral Stent - s/p left ureteroscopy and non-tethered stent 01/27/24 by McKenzie for chronic appearing left distal stone to stone free. Unable to remove in office due to prior prostate radiation / large prostate.  2 - Moderate Risk Prostate Cancer - s/p primary radiaion 2019 for moderate risk disease by Dahlstedt, prostate was 140gm at treatment (much larger than typical for that modality, high complication risk). He has not been compliant with post-cancer followup.  3 - Lower Urinary Tract Symptoms - massive prostate s/p priro radiation. On tamsulosin  at baseline.   4 -  Non-Complex LEFT Renal Cysts - 6cm cyst x 2 on ER CT 2025. No complex features.   Today Calvin Santiago is seen for operative retrieval of retained stent placed by another provider. No interval fevers. He was noted to have amp / nitroF / Vanc sensitive enterococcus previously.   Past Medical History:  Diagnosis Date   Anxiety    Bipolar 2 disorder (HCC)    Depression    Prostate cancer [facility])     Past Surgical History:  Procedure Laterality Date   CYSTOSCOPY/URETEROSCOPY/HOLMIUM LASER/STENT PLACEMENT Left 01/25/2024   Procedure: CYSTOSCOPY/URETEROSCOPY/HOLMIUM LASER/STENT PLACEMENT;  Surgeon: Sherrilee Belvie CROME, MD;  Location: [facility];  Service: Urology;  Laterality: Left;   PROSTATE BIOPSY     RADIOLOGY WITH ANESTHESIA N/A 01/22/2018   Procedure: MRI of the brain WITH ANESTHESIA;  Surgeon: Radiologist, Medication, MD;  Location: [facility] OR;  Service: Radiology;  Laterality: N/A;   REPLACEMENT TOTAL KNEE     bilateral    Family History  Problem Relation Age of Onset   Prostate cancer Father    Prostate cancer Brother    Prostate cancer Maternal Grandfather    Breast cancer Neg Hx    Pancreatic cancer Neg Hx    Colon cancer Neg Hx    Social History:  reports that he has quit smoking. His  smoking use included cigarettes. He has never used smokeless tobacco. He reports that he does not drink alcohol and does not use drugs.  Allergies:  Allergies  Allergen Reactions   Sulfa Antibiotics Other (See Comments)    As a child, he ran into walls and would bang his head against the wall    No medications prior to admission.    No results found for this or any previous visit (from the past 48 hours). No results found.  Review of Systems  Constitutional:  Negative for chills and fever.  All other systems reviewed and are negative.   There were no vitals taken for this visit. Physical Exam Vitals reviewed.  HENT:     Head: Normocephalic.  Eyes:     Pupils: Pupils are equal, round, and reactive to light.  Cardiovascular:     Rate and Rhythm: Normal rate.  Pulmonary:     Effort: Pulmonary effort is normal.  Abdominal:     General: Abdomen is flat.  Genitourinary:    Comments: No CVAT at present. Musculoskeletal:        General: Normal range of motion.     Cervical back: Normal range of motion.  Skin:    General: Skin is warm.  Neurological:     Mental Status: He is alert.  Psychiatric:        Mood and Affect: Mood normal.      Assessment/Plan  Proceed as planned  with operative left ureteral stent removal. Risks, benefits, peri-op course discussed. Rec IV vanc 1gm peri-op today in addition to ancef  previously RX'd.   Ricardo KATHEE Alvaro Mickey., MD 02/06/2024, 6:41 AM

## 2024-02-06 NOTE — Brief Op Note (Signed)
 02/06/2024  9:14 AM  PATIENT:  Calvin Santiago  78 y.o. male  PRE-OPERATIVE DIAGNOSIS:  LEFT UROLITHIASIS  POST-OPERATIVE DIAGNOSIS:  LEFT UROLITHIASIS  PROCEDURE:  Procedure(s): CYSTOSCOPY, FLEXIBLE, WITH STENT REPLACEMENT (Left) CYSTOSCOPY, WITH RETROGRADE PYELOGRAM (Left)  SURGEON:  Surgeons and Role:    * Manny, Ricardo KATHEE Raddle., MD - Primary  PHYSICIAN ASSISTANT:   ASSISTANTS: none   ANESTHESIA:   general  EBL:  minimal   BLOOD ADMINISTERED:none  DRAINS: none   LOCAL MEDICATIONS USED:  NONE  SPECIMEN:  Source of Specimen:  left ureteral stent  DISPOSITION OF SPECIMEN:  discard  COUNTS:  YES  TOURNIQUET:  * No tourniquets in log *  DICTATION: .Other Dictation: Dictation Number [id number]  PLAN OF CARE: Discharge to home after PACU  PATIENT DISPOSITION:  PACU - hemodynamically stable.   Delay start of Pharmacological VTE agent (>24hrs) due to surgical blood loss or risk of bleeding: not applicable

## 2024-02-06 NOTE — Anesthesia Procedure Notes (Signed)
 Procedure Name: LMA Insertion Date/Time: 02/06/2024 9:07 AM  Performed by: Vincenzo Show, CRNAPre-anesthesia Checklist: Patient identified, Emergency Drugs available, Suction available, Patient being monitored and Timeout performed Patient Re-evaluated:Patient Re-evaluated prior to induction Oxygen Delivery Method: Circle system utilized Preoxygenation: Pre-oxygenation with 100% oxygen Induction Type: IV induction Ventilation: Mask ventilation without difficulty LMA: LMA with gastric port inserted LMA Size: 4.0 Number of attempts: 1 Placement Confirmation: positive ETCO2 and breath sounds checked- equal and bilateral Tube secured with: Tape Dental Injury: Teeth and Oropharynx as per pre-operative assessment  Comments: Easy pass of LMA.

## 2024-02-06 NOTE — Op Note (Signed)
 NAME: Calvin Santiago, Calvin Santiago MEDICAL RECORD NO: [id number] ACCOUNT NO: [id number] DATE OF BIRTH: 1945-07-01 FACILITY: [facility] LOCATION: [facility] PHYSICIAN: Ricardo Likens, MD  Operative Report   DATE OF PROCEDURE: 02/06/2024  SURGEON: Ricardo Likens, MD  PREOPERATIVE DIAGNOSES:   1. Retained left ureteral stent. 2. History of prostate cancer with massive prostate.  PROCEDURE PERFORMED:   1. Cystoscopy with retrograde pyelogram with interpretation. 2. Removal of left ureteral stent.  ESTIMATED BLOOD LOSS:  Nil.  COMPLICATIONS:  None.  SPECIMENS:  Left ureteral stent for discard.  FINDINGS:  1.  Very large and hypervascular prostate. 2.  Retained left ureteral stent. 3.  Removal of stent. 4.  Unremarkable retrograde pyelogram.  INDICATIONS:  The patient is a 78 year old man with history of prostate cancer and very large prostate treated with radiation despite being a non-optimal candidate for that modality by another provider years ago.  He developed a left ureteral stone  recently which was treated with ureteroscopy by yet a different provider.  The stone was quite chronic appearing.  The stent was left in place.  He underwent attempt of stent removal in the office earlier this week that was unsuccessful due to his  extremely large prostate with fixation from prior radiation.  Options discussed including the path of operative removal under anesthesia.  He presents for this today.  Informed consent was obtained and placed in the medical record.  DESCRIPTION OF PROCEDURE:  The patient being identified and verified and the procedure being cysto with retrograde with left stent removal was confirmed.  Procedure timeout was performed.  Intravenous antibiotics administered.  General anesthesia  introduced.  The patient was placed in a low lithotomy position.  A sterile field was created, prepped and draped the patient's penis, perineum and proximal thigh using iodine.  Cystourethroscopy was performed  using a 21-French rigid cystoscope with  offset lens.  Inspection of anterior and posterior urethra revealed a very large prostatic hypertrophy with a significant vascularity and varices and friability.  The bladder was somewhat trabeculated.  The distal end of the stent was seen exiting the  ureteral orifice.  With the rigid scope, this was able to be visualized and did require significant torque, given the fixation of his large prostate.  This was grasped and removed past the level of the urethral meatus.  Exchanged for an open-ended  catheter and a left retrograde pyelogram was obtained.    Left retrograde pyelogram demonstrated single left ureter.  There was minimal hydronephrosis.  No evidence of extravasation.  It was felt that leaving the stent out would be safe.  The bladder was partially emptied per cystoscope.  The procedure was then  terminated.  The patient tolerated the procedure well.  No immediate periprocedural complications.  The patient was taken to postanesthesia care unit in stable condition with plans for discharge home after void.  He was also given an extra gram of  vancomycin  preoperatively given recent positive urine cultures.    MUK D: 02/06/2024 9:17:45 am T: 02/06/2024 9:38:00 am  JOB: [id number]/ [id number]

## 2024-02-06 NOTE — Discharge Instructions (Signed)
1 - You may have urinary urgency (bladder spasms) and bloody urine on / off for up to a week. This is normal.  2 - Call MD or go to ER for fever >102, severe pain / nausea / vomiting not relieved by medications, or acute change in medical status

## 2024-02-06 NOTE — Anesthesia Postprocedure Evaluation (Signed)
 Anesthesia Post Note  Patient: Calvin Santiago  Procedure(s) Performed: CYSTOSCOPY, FLEXIBLE, WITH STENT REPLACEMENT (Left: Penis) CYSTOSCOPY, WITH RETROGRADE PYELOGRAM (Left: Penis)     Patient location during evaluation: PACU Anesthesia Type: General Level of consciousness: awake and alert Pain management: pain level controlled Vital Signs Assessment: post-procedure vital signs reviewed and stable Respiratory status: spontaneous breathing, nonlabored ventilation, respiratory function stable and patient connected to nasal cannula oxygen Cardiovascular status: blood pressure returned to baseline and stable Postop Assessment: no apparent nausea or vomiting Anesthetic complications: no   No notable events documented.  Last Vitals:  Vitals:   02/06/24 0945 02/06/24 1015  BP: (!) 156/92 131/75  Pulse: 81 97  Resp: 19 16  Temp:  36.6 C  SpO2: 99% 96%    Last Pain:  Vitals:   02/06/24 1015  TempSrc: Oral  PainSc: 0-No pain                 [name]

## 2024-02-06 NOTE — Transfer of Care (Signed)
 Immediate Anesthesia Transfer of Care Note  Patient: Calvin Santiago  Procedure(s) Performed: Procedure(s): CYSTOSCOPY, FLEXIBLE, WITH STENT REPLACEMENT (Left) CYSTOSCOPY, WITH RETROGRADE PYELOGRAM (Left)  Patient Location: PACU  Anesthesia Type:General  Level of Consciousness:  sedated, patient cooperative and responds to stimulation  Airway & Oxygen Therapy:Patient Spontanous Breathing and Patient connected to face mask oxgen  Post-op Assessment:  Report given to PACU RN and Post -op Vital signs reviewed and stable  Post vital signs:  Reviewed and stable  Last Vitals:  Vitals:   02/06/24 0822  BP: (!) 151/90  Pulse: 84  Resp: 18  Temp: 37.4 C  SpO2: 96%    Complications: No apparent anesthesia complications

## 2024-02-07 ENCOUNTER — Encounter (HOSPITAL_COMMUNITY): Payer: Self-pay | Admitting: Urology

## 2024-02-07 LAB — STONE ANALYSIS
Calcium Oxalate Dihydrate: 20 %
Calcium Oxalate Monohydrate: 80 %
Weight Calculi: 8 mg
